# Patient Record
Sex: Female | Born: 1960 | Race: Black or African American | Hispanic: No | Marital: Single | Smoking: Current every day smoker
Health system: Southern US, Community
[De-identification: ages and names within clinical notes are randomized; demographics above are authoritative.]

## PROBLEM LIST (undated history)

## (undated) ENCOUNTER — Emergency Department (HOSPITAL_COMMUNITY): Disposition: A | Discharge status: Home / Self Care | Payer: Self-pay

## (undated) DIAGNOSIS — F319 Bipolar disorder, unspecified: Secondary | ICD-10-CM

## (undated) DIAGNOSIS — F32A Depression, unspecified: Secondary | ICD-10-CM

## (undated) DIAGNOSIS — F329 Major depressive disorder, single episode, unspecified: Secondary | ICD-10-CM

## (undated) DIAGNOSIS — F419 Anxiety disorder, unspecified: Secondary | ICD-10-CM

## (undated) DIAGNOSIS — I10 Essential (primary) hypertension: Secondary | ICD-10-CM

## (undated) HISTORY — DX: Depression, unspecified: F32.A

## (undated) HISTORY — PX: ABDOMINAL HYSTERECTOMY: SHX81

## (undated) HISTORY — DX: Major depressive disorder, single episode, unspecified: F32.9

## (undated) HISTORY — PX: OTHER SURGICAL HISTORY: SHX169

## (undated) HISTORY — DX: Anxiety disorder, unspecified: F41.9

## (undated) HISTORY — PX: ECTOPIC PREGNANCY SURGERY: SHX613

---

## 2000-04-17 ENCOUNTER — Ambulatory Visit (HOSPITAL_BASED_OUTPATIENT_CLINIC_OR_DEPARTMENT_OTHER): Admission: RE | Admit: 2000-04-17 | Discharge: 2000-04-17 | Payer: Self-pay | Admitting: Otolaryngology

## 2002-08-18 ENCOUNTER — Ambulatory Visit (HOSPITAL_BASED_OUTPATIENT_CLINIC_OR_DEPARTMENT_OTHER): Admission: RE | Admit: 2002-08-18 | Discharge: 2002-08-18 | Payer: Self-pay | Admitting: Obstetrics and Gynecology

## 2002-08-18 ENCOUNTER — Encounter (INDEPENDENT_AMBULATORY_CARE_PROVIDER_SITE_OTHER): Payer: Self-pay | Admitting: Specialist

## 2003-02-27 ENCOUNTER — Encounter: Payer: Self-pay | Admitting: Obstetrics and Gynecology

## 2003-03-02 ENCOUNTER — Inpatient Hospital Stay (HOSPITAL_COMMUNITY): Admission: RE | Admit: 2003-03-02 | Discharge: 2003-03-04 | Payer: Self-pay | Admitting: Obstetrics and Gynecology

## 2003-03-02 ENCOUNTER — Encounter (INDEPENDENT_AMBULATORY_CARE_PROVIDER_SITE_OTHER): Payer: Self-pay | Admitting: Specialist

## 2004-04-10 ENCOUNTER — Inpatient Hospital Stay (HOSPITAL_COMMUNITY): Admission: EM | Admit: 2004-04-10 | Discharge: 2004-04-13 | Payer: Self-pay | Admitting: *Deleted

## 2004-05-08 ENCOUNTER — Encounter: Admission: RE | Admit: 2004-05-08 | Discharge: 2004-05-08 | Payer: Self-pay | Admitting: Surgery

## 2004-05-09 ENCOUNTER — Inpatient Hospital Stay (HOSPITAL_COMMUNITY): Admission: AD | Admit: 2004-05-09 | Discharge: 2004-05-16 | Payer: Self-pay | Admitting: Surgery

## 2004-05-09 ENCOUNTER — Encounter (INDEPENDENT_AMBULATORY_CARE_PROVIDER_SITE_OTHER): Payer: Self-pay | Admitting: Specialist

## 2005-07-30 ENCOUNTER — Ambulatory Visit: Payer: Self-pay | Admitting: Psychiatry

## 2005-07-30 ENCOUNTER — Other Ambulatory Visit (HOSPITAL_COMMUNITY): Admission: RE | Admit: 2005-07-30 | Discharge: 2005-08-13 | Payer: Self-pay | Admitting: Psychiatry

## 2006-01-09 ENCOUNTER — Ambulatory Visit: Payer: Self-pay | Admitting: Internal Medicine

## 2006-04-14 ENCOUNTER — Ambulatory Visit: Payer: Self-pay | Admitting: Internal Medicine

## 2007-10-18 DIAGNOSIS — J45909 Unspecified asthma, uncomplicated: Secondary | ICD-10-CM

## 2007-10-18 DIAGNOSIS — Z8709 Personal history of other diseases of the respiratory system: Secondary | ICD-10-CM | POA: Insufficient documentation

## 2009-04-25 ENCOUNTER — Encounter: Payer: Self-pay | Admitting: Internal Medicine

## 2009-04-25 ENCOUNTER — Other Ambulatory Visit: Admission: RE | Admit: 2009-04-25 | Discharge: 2009-04-25 | Payer: Self-pay | Admitting: Internal Medicine

## 2009-04-25 ENCOUNTER — Ambulatory Visit: Payer: Self-pay | Admitting: Internal Medicine

## 2009-04-25 DIAGNOSIS — I10 Essential (primary) hypertension: Secondary | ICD-10-CM

## 2010-04-26 ENCOUNTER — Encounter: Payer: Self-pay | Admitting: Internal Medicine

## 2010-04-26 ENCOUNTER — Ambulatory Visit: Payer: Self-pay | Admitting: Family Medicine

## 2010-04-26 LAB — CONVERTED CEMR LAB
AST: 13 units/L (ref 0–37)
Albumin: 4.5 g/dL (ref 3.5–5.2)
Amphetamine Screen, Ur: NEGATIVE
Barbiturate Quant, Ur: NEGATIVE
Basophils Relative: 0 % (ref 0–1)
Benzodiazepines.: NEGATIVE
CO2: 27 meq/L (ref 19–32)
MCHC: 32.7 g/dL (ref 30.0–36.0)
MCV: 68.9 fL — ABNORMAL LOW (ref 78.0–100.0)
Marijuana Metabolite: NEGATIVE
Neutro Abs: 4.4 10*3/uL (ref 1.7–7.7)
Neutrophils Relative %: 58 % (ref 43–77)
Opiate Screen, Urine: NEGATIVE
Phencyclidine (PCP): NEGATIVE
Potassium: 3.7 meq/L (ref 3.5–5.3)
RBC: 6.17 M/uL — ABNORMAL HIGH (ref 3.87–5.11)
RDW: 17.9 % — ABNORMAL HIGH (ref 11.5–15.5)
Sodium: 141 meq/L (ref 135–145)
Total Bilirubin: 0.4 mg/dL (ref 0.3–1.2)
Total Protein: 6.8 g/dL (ref 6.0–8.3)

## 2010-04-30 ENCOUNTER — Encounter: Payer: Self-pay | Admitting: Internal Medicine

## 2010-04-30 LAB — CONVERTED CEMR LAB
Ferritin: 78 ng/mL (ref 10–291)
Saturation Ratios: 25 % (ref 20–55)

## 2010-05-27 ENCOUNTER — Ambulatory Visit: Payer: Self-pay | Admitting: Internal Medicine

## 2010-08-21 ENCOUNTER — Ambulatory Visit: Payer: Self-pay | Admitting: Internal Medicine

## 2010-09-02 ENCOUNTER — Ambulatory Visit (HOSPITAL_COMMUNITY): Admission: RE | Admit: 2010-09-02 | Discharge: 2010-09-02 | Payer: Self-pay | Admitting: Family Medicine

## 2011-01-12 ENCOUNTER — Encounter: Payer: Self-pay | Admitting: Surgery

## 2011-02-03 ENCOUNTER — Encounter: Payer: Self-pay | Admitting: Internal Medicine

## 2011-02-03 LAB — CONVERTED CEMR LAB
BUN: 7 mg/dL (ref 6–23)
Creatinine, Ser: 0.8 mg/dL (ref 0.40–1.20)
HCT: 43.3 % (ref 36.0–46.0)
MCHC: 32.6 g/dL (ref 30.0–36.0)
MCV: 70.3 fL — ABNORMAL LOW (ref 78.0–100.0)
RBC: 6.16 M/uL — ABNORMAL HIGH (ref 3.87–5.11)
RDW: 15.7 % — ABNORMAL HIGH (ref 11.5–15.5)
TSH: 1.754 microintl units/mL (ref 0.350–4.500)

## 2011-03-18 ENCOUNTER — Encounter: Payer: Self-pay | Admitting: Internal Medicine

## 2011-03-18 LAB — CONVERTED CEMR LAB
CO2: 26 meq/L (ref 19–32)
Calcium: 9.3 mg/dL (ref 8.4–10.5)
Creatinine, Ser: 0.79 mg/dL (ref 0.40–1.20)
Glucose, Bld: 99 mg/dL (ref 70–99)
Iron: 47 ug/dL (ref 42–145)
Sodium: 141 meq/L (ref 135–145)

## 2011-05-09 NOTE — H&P (Signed)
NAME:  Jackie Odonnell, Jackie Odonnell                        ACCOUNT NO.:  [id number]   MEDICAL RECORD NO.:  [id number]                   PATIENT TYPE:  INP   LOCATION:  0346                                 FACILITY:  [facility]   PHYSICIAN:  Sandria Bales. Ezzard Standing, M.D.               DATE OF BIRTH:  [birth year]   DATE OF ADMISSION:  04/09/2004  DATE OF DISCHARGE:                                HISTORY & PHYSICAL   HISTORY OF PRESENT ILLNESS:  Jackie Odonnell is a 50 year old black female who  is a patient of Dr. Darryll Capers who on Saturday, April 16, started  developing abdominal distention and vomiting. She had not really had a bowel  movement really since about Friday/Saturday which would be April 15 or 16.  Because of continued abdominal discomfort and distention, she saw Dr.  Lovell Sheehan on Monday, April 18. He recommended trying Gas-X and stool  softeners, but this really did not relieve her symptoms. Returned on  Tuesday, saw Dr. Amador Cunas at that time; however, after she left she  continued to feel worse, went to the urgent care where she saw Dr. Yong Channel  who obtained a KUB which was suggestive of a small-bowel obstruction. He  then referred her to me, and I met her in the [facility] emergency room.   She has had a prior upper endoscopy for a hiatal hernia by Dr. Yancey Flemings  about 2001. Has been treated for this medically. She has no history of liver  disease, gallbladder disease, pancreatic disease, colon disease. Her prior  abdominal operations included tubal pregnancy about 1987, and then she had a  hysterectomy by Dr. Kyra Manges in 2003 for benign causes. This was done  transabdominally. She said he did not take her ovaries out.   ALLERGIES:  She has no allergies.   CURRENT MEDICATIONS:  1. Amitriptyline 25 mg nightly.  2. Abilify 20 mg daily.   REVIEW OF SYSTEMS:  NEUROLOGICAL:  She has apparently been diagnosed with  paranoia, sees a Dr. Nolen Mu. She never had a stroke or a seizure.  PULMONARY:  Does not smoke cigarettes. No evidence of pneumonia,  tuberculosis. CARDIAC:  No evidence of heart disease, chest pain, or  hypertension. GASTROINTESTINAL:  See history of present illness.  GYNECOLOGIC:  She has total abdominal hysterectomy for a large uterine  fibroid in March in 2004 by Dr. Kyra Manges. UROLOGIC:  No kidney stones or  kidney infections.   SOCIAL HISTORY:  She works at Clinical biochemist at [employer]. She is  accompanied by her mother in the emergency room.   PHYSICAL EXAMINATION:  VITAL SIGNS:  Her temperature is 97, blood pressure  124/87, pulse is 132, respirations are 20.  GENERAL:  She is a well-developed, actually very pleasant black lady who is  alert, gives a pretty good history.  HEENT:  Unremarkable.  NECK:  Supple. No mass, no thyromegaly.  LUNGS:  Clear to auscultation.  There are symmetric breath sounds.  HEART:  Regular rate and rhythm. She is a little tachycardic. I do not hear  a murmur.  BREASTS:  She has no mass in either breast.  ABDOMEN:  She really has no tenderness or guarding. She may be mildly  distended. It is a really fairly unimpressive abdominal exam. I do not feel  any hernia.  EXTREMITIES:  She has good strength in all four extremities.  NEUROLOGICAL:  Grossly intact.   LABORATORY DATA:  Show a white blood cell count of 19,000, hemoglobin 15.6,  hematocrit 48, and platelet count 313,000. She has a sodium of 133,  potassium 3.0, chloride 88, CO2 of 34, glucose 114, BUN 28, creatinine 1.5.  Her albumin is 3.6, amylase 35, lipase 13.   Again, she brings films from [facility] where it certainly looks  like a bowel obstruction. She has not been treated at all, so it is really  unclear how complete this is.   IMPRESSION:  1. Bowel obstruction. Will plan NG tube and IV fluids. Rehydration and     reevaluation with serial x-rays. After she gets better, she may need some     further GI evaluation. She ___________  surgical intervention. Discussed     this with her and her mother.  2. Hypokalemic/hypochloremic metabolic alkalosis. ______________.  3. History of hiatal hernia but not on any chronic medicines for this.  4. 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                                               Sandria Bales. Ezzard Standing, M.D.    DHN/MEDQ  D:  04/10/2004  T:  04/10/2004  Job:  [id number]   cc:   Stacie Glaze, M.D. [facility]   Thereasa Parkin, M.D.   Katherine Roan, M.D.  [address]  [place]  [place] [address]  Fax: [phone]   Wilhemina Bonito. Marina Goodell, M.D. [facility]

## 2011-05-09 NOTE — Op Note (Signed)
NAME:  Jackie Odonnell, Jackie Odonnell                        ACCOUNT NO.:  [id number]   MEDICAL RECORD NO.:  [id number]                   PATIENT TYPE:  INP   LOCATION:  0002                                 FACILITY:  [facility]   PHYSICIAN:  Katherine Roan, M.D.               DATE OF BIRTH:  11-19-1961   DATE OF PROCEDURE:  DATE OF DISCHARGE:                                 OPERATIVE REPORT   PREOPERATIVE DIAGNOSES:  Large uterine fibroids, anemia, and abnormal  bleeding.   POSTOPERATIVE DIAGNOSES:  Large uterine fibroids, anemia, and abnormal  bleeding.   OPERATION:  Pelvic exam under anesthesia, exploratory laparotomy, total  abdominal hysterectomy.   BRIEF HISTORY:  Ms. Forbush is a 50 year old female status post two  hysteroscopies and conservatives attempts at controlling her heavy bleeding.  This has been unsuccessful even with Aygestin. She has a large submucous  fibroid. The patient was placed in the frog leg position under general  anesthesia and examined and confirmed a 12 week size uterine fibroid that  was mobile. The patient was then prepped and draped, Foley catheter was  inserted. A transverse incision was made in the abdomen and extended to the  peritoneal cavity which was entered vertically. Hemostasis with the Bovie.  Exploration of the upper abdomen revealed a smooth liver, two normal  kidneys, no adenopathy was noted. I felt no stones in the gallbladder. The  abdominal viscera were then packed away from the pelvic viscera and the  uterus was elevated. The round ligaments were then clamped and ligated with  #0 Chromic suture. The uteroovarian anastomosis and tube were clamped on  each side and ligated. Both ovaries were quite small. The uterine arteries  were skeletonized, a bladder flap created, uterine arteries were clamped on  each side and ligated. The cardinal and uterosacral ligaments were handled  in succession and sutured with #0 Chromic. The angles of the vagina  were  entered and the specimens removed from the operative field. The vagina was  then closed vertically with horizontal mattress suture of #0 Chromic  following which I used a figure-of-eight #0 Vicryl. The uterosacral  ligaments were then plicated in the midline with interrupted sutures of #0  Vicryl. Hemostasis appeared to be quite secure, reperitonealization with 3-0  Vicryl. The pelvis was the washed with copious amounts of saline, the  pareitoperitoneum was closed with 2-0 PDS, the fascia was closed with  continuous 2-0 PDS and one interrupted 2-0 PDS. Hemostasis was secure. The  subcutaneum was irrigated and skin was closed with 3-0 plain. The incision  was infiltrated with 0.5% Marcaine. Ms. Haselton tolerated this procedure  well and was sent to the recovery room in good condition.  Katherine Roan, M.D.    SDM/MEDQ  D:  03/02/2003  T:  03/02/2003  Job:  [id number]

## 2011-05-09 NOTE — Op Note (Signed)
NAME:  Jackie Odonnell, Jackie Odonnell                        ACCOUNT NO.:  [id number]   MEDICAL RECORD NO.:  [id number]                   PATIENT TYPE:  INP   LOCATION:  5703                                 FACILITY:  [facility]   PHYSICIAN:  Sandria Bales. Ezzard Standing, M.D.               DATE OF BIRTH:  July 12, 1961   DATE OF PROCEDURE:  05/09/2004  DATE OF DISCHARGE:                                 OPERATIVE REPORT   PREOPERATIVE DIAGNOSIS:  High-grade small bowel obstruction.   POSTOPERATIVE DIAGNOSIS:  Small bowel obstruction secondary to adhesive band  to right pelvis, with stricture of distal small bowel.   PROCEDURE:  1. Enterolysis of adhesions.  2. Laparoscopic converted open enterolysis of adhesions, with resection of 8     cm segment of small bowel.   SURGEON:  Sandria Bales. Ezzard Standing, M.D.   FIRST ASSISTANT:  None.   ANESTHESIA:  General endotracheal.   ESTIMATED BLOOD LOSS:  Minimal.   INDICATIONS FOR PROCEDURE:  Jackie Odonnell is a 50 year old black female who  was hospitalized at [facility] from April 19-23, 2005 for a small  bowel obstruction, which resolved with medical management.  However, since  going home she has continued to have periods of bloating, nausea and  vomiting.  She underwent a small bowel series yesterday done at [facility], which  showed a high-grade bowel obstruction.  She had vomited two days ago.  She  was only able to take liquids until she gets sick, therefore she is brought  to the hospital today for laparoscope abdominal exploration for evaluation  of recurrent high-grade small bowel obstruction.   The indications and potential complications were discussed with the patient.  The potential complications include, but not limited to:  bleeding,  infection, bowel resection and the possibility of open surgery.   DESCRIPTION OF PROCEDURE:  The patient is taken to the operating room, where  she was given 2 g Cefotetan at the initiation of the procedure.  She had a  Foley  catheter in place, NG tube in place, PAS stockings in place.  I first  went to an infraumbilical incision and put a 10 mm 0-degree laparoscope  through a 12 mm Hasson trocar.  Secured the Hasson trocar with the 0 Vicryl  suture.  I placed two 5 mm trocars, one in the right upper quadrant and one  in the left lower quadrant.  I was able to actually get a pretty good  exploration of the abdomen.  The right and left lobes of the liver were  unremarkable.  She has known gallstones, and though her gallbladder was  maybe somewhat pale it was not acutely inflamed.  Her stomach was  unremarkable.  What was remarkable is that she did have essentially  dilatation of almost her entire small bowel.  I was able to mobilize the  small bowel out of the pelvis, and traced the small bowel to a narrowing  in  the distal small bowel.  There was, however, a band which I could not  totally clear whether I had released the entire band or not, or that whether  she had a stricture.   I went on and made an open laparotomy incision through a midline incision.  I was able to mobilize upper small bowel.  Again, she had a single band.  She had a single adhesion that was still attached to the right pelvis, which  I went on and cut.  I then tried to free up the small bowel, but really  could not free up which was fairly tightly strictured (down to maybe 5-6 mm  in size would be my guess).  I did not think I could do an adequate  strictureplasty because of some other scarring around it, so I decided to go  on and just resect this limited segment of bowel.  It did not look malignant  and it was really a solitary adhesive segment.   I resected about 8 cm of small bowel, ligated the mesentery with interrupted  2-0 silk pop-off sutures.  I stapled both ends with Endo GIA stapler; what I  ended up having was about 8-10 cm of the terminal ileum with a small bowel;  so I actually did a side-to-side handsewn anastomosis, and  created about a 2  cm enterotomy; sewing this with interrupted 2-0 silk sutures.  This did  allow a fingerbreadth and a half through this anastomosis when it was  complete.  I buttressed it with 2-0 silk sutures, returned the cecum and  terminal ileum back to the abdomen.   I then irrigated out with 2 L of saline.  There was some minimal  contamination because she did have barium and had some content in the small  bowel; but, this was fairly minimally.  I then irrigated out the wound.  I  closed the abdomen with two running #1 PDS sutures, closed the skin with  skin staples, stapled both the 5 mm ports.  I did leave the NG tube in.  The  Foley catheter we will leave overnight (we can probably take this out  tomorrow).   The final pathology is pending at the time of this dictation.                                               Sandria Bales. Ezzard Standing, M.D.    DHN/MEDQ  D:  05/09/2004  T:  05/11/2004  Job:  [id number]   cc:   Stacie Glaze, M.D. [facility]   S. Kyra Manges, M.D.  [phone]  [place]  [place] [address]  Fax: [phone]   Wilhemina Bonito. Marina Goodell, M.D. [facility]

## 2011-05-09 NOTE — Discharge Summary (Signed)
   NAMESTEPHANNY, Jackie Odonnell                        ACCOUNT NO.:  [id number]   MEDICAL RECORD NO.:  [id number]                   PATIENT TYPE:  INP   LOCATION:  0464                                 FACILITY:  [facility]   PHYSICIAN:  Katherine Roan, M.D.               DATE OF BIRTH:  Sep 01, 1961   DATE OF ADMISSION:  03/02/2003  DATE OF DISCHARGE:  03/04/2003                                 DISCHARGE SUMMARY   ADMISSION DIAGNOSES:  1. Large uterine fibroids.  2. Menorrhagia.  3. Anemia.   DISCHARGE DIAGNOSES:  1. Large uterine fibroids.  2. Menorrhagia.  3. Anemia.   OPERATION PERFORMED:  Total abdominal hysterectomy.   HISTORY OF PRESENT ILLNESS:  The patient is a 50 year old female status post  multiple hysteroscopies to control a large mucus myoma with continued  bleeding.  She was admitted for hysterectomy.   LABORATORY DATA:  Chest x-ray was negative.  Cardiogram was normal.  Pathology report confirmed a 340 g uterus.  Comprehensive metabolic profile  was normal.  Admission hemoglobin was 10, with a MCV of 55.   HOSPITAL COURSE:  The patient was admitted to the hospital, underwent an  uneventful hysterectomy.  Postoperatively, she was afebrile and without  complaints.  Incision healing nicely.  She was discharged home on 03/04/03,  to home and office care.  Asked to call for fever, bleeding, or any other  difficulties she may encounter.   FOLLOWUP:  She is to return to the office in two weeks.   CONDITION ON DISCHARGE:  Improved.                                               Katherine Roan, M.D.    SDM/MEDQ  D:  03/04/2003  T:  03/04/2003  Job:  [id number]

## 2011-05-09 NOTE — H&P (Signed)
NAME:  Jackie Odonnell, Jackie Odonnell                        ACCOUNT NO.:  [id number]   MEDICAL RECORD NO.:  [id number]                   PATIENT TYPE:  INP   LOCATION:  NA                                   FACILITY:  [facility]   PHYSICIAN:  Katherine Roan, M.D.               DATE OF BIRTH:  1961/04/07   DATE OF ADMISSION:  DATE OF DISCHARGE:                                HISTORY & PHYSICAL   CHIEF COMPLAINT:  Continued abnormal uterine bleeding.   HISTORY OF PRESENT ILLNESS:  Jackie Odonnell is a 49 year old gravida 1 female  who presents with long-term abnormal vaginal bleeding which has not  responded to hysteroscopy with resection of large uterine fibroids. We tried  to do this on at least one occasion. The second time we did it was we  removed about a 7 cm aggregate of the myoma and she continues to bleed and  hysterectomy is recommended.   MEDICATIONS:  Aygestin 5 mg b.i.d. and she is on amitriptyline 25 mg and  Haldol 2 mg at bedtime. She also is on Prilosec.   REVIEW OF SYMPTOMS:  HEENT:  She has frequent sore throat but no decrease in  visual or auditory acuity. No dizziness. HEART:  No history of hypertension,  no rheumatic fever. LUNGS:  No history of cough. GU:  No stress  incontinence. She denies history of nephritis. GI:  Negative. No bowel habit  change, no melena. MUSCLE/BONES/JOINTS:  Negative. No fractures or  arthritis.   SOCIAL HISTORY:  She smokes a pack of cigarettes daily. Mother and father  are both living. She has one brother and one sister who is in good health.  She has a paternal grandmother who is diabetic.   Her comorbidities are involved with the treatment using with Haldol for a  personality disorder. She is well compensated with this medication.   PHYSICAL EXAMINATION:  GENERAL:  Reveals a pleasant, well-developed,  nourished, 50 year old black female who appears to be her stated age, is  oriented to time, place and recent events. Has just watched informed  consent  movie in regard to hysterectomy.  VITAL SIGNS:  Blood pressure 140/80, weight 179.  HEENT:  Unremarkable. Pupils are round, reactive to light and accomodation.  Oropharynx is not injected. Carotid pulses are equal without bruit. Thyroid  is slightly enlarged and dense. No focal lesions are noted. No adenopathy  appreciated.  BREASTS:  No masses or tenderness.  LUNGS:  Clear to P&A.  HEART:  Normal sinus rhythm, no murmurs.  ABDOMEN:  Soft. Liver, spleen and kidneys are not palpated. There is a low  transverse incision the abdomen. No masses or tenderness and bowel sounds  are normal.  EXTREMITIES:  Show good range of motion, equal pulses and reflexes.  PELVIC:  Reveals and anterior and normal looking vagina. Cervix is normal,  uterus is anterior, 2-3 times normal size and irregular. Adnexa negative.  Rectovaginal  confirms. Pap was taken.   IMPRESSION:  1. Essentially normal examination with uterine fibroids.  2.     History of personality disorder.  3. History of ectopic pregnancy.   PLAN:  Total abdominal hysterectomy. Risks and benefits have been discussed  with the patient.                                               Katherine Roan, M.D.    SDM/MEDQ  D:  02/28/2003  T:  02/28/2003  Job:  [id number]

## 2011-05-09 NOTE — Op Note (Signed)
   Jackie Odonnell, Jackie Odonnell                         ACCOUNT NO.:  [id number]   MEDICAL RECORD NO.:  [id number]                   PATIENT TYPE:  AMB   LOCATION:  NESC                                 FACILITY[facility]   PHYSICIAN:  Katherine Roan, M.D.               DATE OF BIRTH:  1961-07-25   DATE OF PROCEDURE:  08/18/2002  DATE OF DISCHARGE:                                 OPERATIVE REPORT   PREOPERATIVE DIAGNOSES:  1. Heavy periods.  2. Uterine fibroids.   POSTOPERATIVE DIAGNOSES:  1. Heavy periods.  2. Uterine fibroids.   OPERATION:  Hysteroscopy with resection of uterine submucosal fibroids.   DESCRIPTION OF PROCEDURE:  The patient was placed in lithotomy position and  prepped and draped in the usual fashion.  Cervix carefully dilated and  endometrial cavity was examined.  There were several large uterine fibroids  which were resected with the resectoscope.  Prior to resection, I injected  the uterus with 10 cc on each side of a Pitressin solution.  All of the  resected material was sent to the lab for study.  Prior to hysteroscopy,  exam under anesthesia revealed a 10-12 week size uterus.  No adnexal masses  were noted.  The patient tolerated the procedure well and was sent to the  recovery room in good condition.                                               Katherine Roan, M.D.    SDM/MEDQ  D:  08/18/2002  T:  08/20/2002  Job:  [id number]

## 2011-05-09 NOTE — Discharge Summary (Signed)
NAME:  Jackie Odonnell, Jackie Odonnell                        ACCOUNT NO.:  [id number]   MEDICAL RECORD NO.:  [id number]                   PATIENT TYPE:  INP   LOCATION:  5703                                 FACILITY:  [facility]   PHYSICIAN:  Sandria Bales. Ezzard Standing, M.D.               DATE OF BIRTH:  09-Feb-1961   DATE OF ADMISSION:  05/09/2004  DATE OF DISCHARGE:  05/16/2004                                 DISCHARGE SUMMARY   DISCHARGE DIAGNOSES:  1. Distal small-bowel obstruction with segmental fibrosis.  2. Gallstones, asymptomatic.  3. Hiatal hernia with chronic reflux.  4. Paranoid schizophrenia, followed by Dr. Andee Poles.  5. History of total abdominal hysterectomy in March of 2004 by Dr. Katherine Roan.   OPERATION PERFORMED:  Enterolysis of adhesions and resection of distal small  bowel by Dr. Sandria Bales. Newman on the 19th of May 2005.   HISTORY OF ILLNESS:  Jackie Odonnell is a 50 year old black female, who is a  patient of Dr. Stacie Glaze, who developed abdominal pain and distention  in April of 2005 and was admitted to [facility] from the 19th to  the 23rd of April with a partial bowel obstruction.  This bowel obstruction,  however, has resolved with medical therapy.  She was discharged home on the  23rd of April, doing well.  However, about 1 week prior to admission, she  started developing increasing abdominal symptoms of discomfort, could only  keep liquids down.  She had a small bowel series done on the 18th of May  2005 which showed a high-grade distal bowel obstruction.  She is now  admitted on the 19th of May 2005 for abdominal exploration and  identification of her source of this bowel obstruction.   PAST MEDICAL HISTORY:  Her past medical history is significant in that she  had no allergies.   MEDICATIONS:  Her current medications include:  1. Amitriptyline 25 mg nightly.  2. Abilify 20 mg daily.   PAST SURGICAL HISTORY:  Her only prior abdominal surgery had been  a total  abdominal hysterectomy by Dr. Kyra Manges in March of 2004 for benign  uterine fibroids.  She has no history of malignancy.  She works in Dietitian for [employer].   HOSPITAL COURSE:  On the day of admission, she was taken to the operating  room, where she underwent a laparotomy.  I actually tried to do this first  laparoscopically but could not really expose the strictured area well  laparoscopically, so I did convert her to open surgery.  She had a band to  the pelvis which had tied down a piece of small bowel at the distal ileum.  I lysed the band, exposed the small bowel, but she had a high-grade  stricture at the area where she had the obstruction.  I therefore resected  about 8 cm of small bowel immediately  before the terminal ileum and did a  primary end-to-end small bowel anastomosis immediately before the terminal  ileum.   The final pathology showed ulceration and inflammation, though there was a  question of Crohn's on the pathology; she had no clinical Crohn's evidence  at the time of laparotomy.  Postoperatively, she did well.  I kept an NG  tube in her for 3 days, then removed the NG tube when she started passing  flatus.  By the 5th postoperative day, she was able to start clear liquids  and then advance to a regular diet.  She is now 7 days postop.  She is  afebrile.  Her abdominal wound is well-healed.  We will remove her staples  today and steri-strip her wound.   DISCHARGE MEDICATIONS:  She will be given Vicodin for pain and resume her  home medicines.   FOLLOWUP:  She will return to see me in 2-3 weeks for followup.  She knows  to call for any interval problems.                                                Sandria Bales. Ezzard Standing, M.D.    DHN/MEDQ  D:  05/16/2004  T:  05/18/2004  Job:  [id number]   cc:   Stacie Glaze, M.D. [facility]   S. Kyra Manges, M.D.  [phone]  [place]  [place] [address]  Fax: [phone]   Wilhemina Bonito. Marina Goodell, M.D.  [facility]

## 2011-05-09 NOTE — Op Note (Signed)
[place]. [facility]  Patient:    Jackie Odonnell, Jackie Odonnell                       MRN: [id number] Proc. Date: 04/17/00 Adm. Date:  [id number] Attending:  Carlean Purl CC:         Stacie Glaze, M.D. [facility]                           Operative Report  PREOPERATIVE DIAGNOSIS:  A hoarseness with persistent vocal cord leukoplakia.  POSTOPERATIVE DIAGNOSIS:  A hoarseness with persistent vocal cord leukoplakia.  OPERATION:  Microlaryngoscopy with biopsy of left vocal cord leukoplakia (vocal cord stripping).  SURGEON:  Kristine Garbe. Ezzard Standing, M.D.  ANESTHESIA:  General endotracheal.  COMPLICATIONS:  None.  BRIEF CLINICAL NOTE:  Jackie Odonnell is a 50 year old female, who has had hoarseness for close to a year.  It has varied in severity.  She also has a significant smoking history of a pack a day.  On examination in the office, she has had a persistent leukoplakia, much worse on the left vocal cord compared to the right.  She had normal vocal cord mobility otherwise.  Because of the persistent leukoplakia and persistent hoarseness and history of smoking, she is taken to the operating room at this time for direct laryngoscopy and biopsy of the left vocal cord leukoplakia.  DESCRIPTION OF PROCEDURE:  After adequate endotracheal anesthesia, direct laryngoscopy was performed.  Base of tongue, epiglottis, both piriform sinuses and AE folds all appeared normal.  On evaluation of the endolarynx, Jackie Odonnell had bilateral leukoplakia with leukoplakic changes on the right true vocal cord and a fairly pronounced leukoplakic changes on the left vocal cord with a small ulceration.  Photos were obtained.  Then, the left vocal cord was stripped using small microforceps and scissors.  This completed the procedure. Jackie Odonnell was subsequently awoke from anesthesia and transferred to recovery room postoperatively doing well.  DISPOSITION:  Jackie Odonnell was discharged home.  Will place her  on antacid therapy for three weeks, Prilosec 20 mg per day for three weeks in addition to voice rest and encourage her to decrease her smoking.  Will have her follow up in my office in two weeks for recheck and to review pathology of biopsy. DD:  04/17/00 TD:  04/17/00 Job: [id number]

## 2011-05-09 NOTE — Discharge Summary (Signed)
NAME:  Jackie Odonnell, Jackie Odonnell                        ACCOUNT NO.:  [id number]   MEDICAL RECORD NO.:  [id number]                   PATIENT TYPE:  INP   LOCATION:  0346                                 FACILITY:  [facility]   PHYSICIAN:  Sandria Bales. Ezzard Standing, M.D.               DATE OF BIRTH:  September 02, 1961   DATE OF ADMISSION:  04/09/2004  DATE OF DISCHARGE:  04/13/2004                                 DISCHARGE SUMMARY   DISCHARGE DIAGNOSES:  1. Resolved partial bowel obstruction; etiology unknown.  2. Hypokalemic, hypochloremic metabolic alkalosis, therefore is reversed.  3. History of hiatal hernia with chronic reflux esophagitis.  4. Paranoid schizophrenia, followed by Dr. Nolen Mu, but appeared stable     during her hospitalization.  5. History of total abdominal hysterectomy in March 2004 by Dr. Kyra Manges.   OPERATION:  None.   HISTORY OF PRESENT ILLNESS:  Jackie Odonnell is a 50 year old black female who  is a patient of Dr. Darryll Capers, who on Saturday, April 06, 2004, started  developing increasing abdominal distention. She claims to have not had a  bowel movement since Friday and Saturday of April 16th and 17th. Because of  increased discomfort, she saw Dr. Lovell Sheehan on Monday, April 18th, was tried  on Gas-X and stool softeners, but was not having relief of her symptoms. On  Tuesday, April 19th, she saw Dr. Eleonore Chiquito, and then later saw in  the Urgent Care Clinic, Dr. Merla Riches, who obtained a KUB which was  suggestive of small bowel obstruction and referred to me.   I met her in the [facility] emergency room. She had a prior upper endoscopy  for hiatal hernia by Dr. Yancey Flemings and this is being treated medically. She  had no other significant gastrointestinal history. She had had a tubal  pregnancy in 1987 and she had a hysterectomy by Dr. Elana Alm for benign  reasons in March 2004.  She also carries a history of paranoid  schizophrenia, followed by Dr. Nolen Mu, but apparently  this has been fairly  stable.   On admission she was found to have a KUB that appeared to be consistent with  a partial small bowel obstruction. Her white blood count was 19,000,  hemoglobin 15, and hematocrit 48. Her potassium was 3.3, chloride 88. She  was admitted, placed on IV fluids, and rehydrated. She also had an NG tube  placed on the first postoperative day. On the a.m. of admission, her white  blood cell count was 18,900, sodium 131, potassium 4.3, chloride 91.  She  started passing gas and having a bowel movement on her first hospital day.  The KUB showed a significantly less small bowel area, now she had colon air.  Her NG tube was removed. She was started on clear liquids and by the third  hospital day she was afebrile, her abdomen was soft, she was passing flatus,  having  bowel movements, and she was ready for discharge.   Her discharge condition was improved. She is to return on her medications  she had been on before she came in which include amitriptyline 25 mg at  night, Abilify 20 mg every night. There is no restriction to her diet. She  can have any kind of activity she wanted as she had no wounds to take care  of. She is to call to see me within one or two weeks back in the office to  make sure she is doing well. She is to get back in touch with Dr. Lovell Sheehan'  office.                                               Sandria Bales. Ezzard Standing, M.D.    DHN/MEDQ  D:  05/08/2004  T:  05/09/2004  Job:  [id number]   cc:   Stacie Glaze, M.D. [facility]   Robert P. Merla Riches, M.D.  [address]  [place]  [place] [address]  Fax: [phone]   S. Kyra Manges, M.D.  [phone] [address]  [place]  [place] [address]  Fax: [phone]   Wilhemina Bonito. Marina Goodell, M.D. [facility]

## 2012-08-05 ENCOUNTER — Inpatient Hospital Stay (HOSPITAL_COMMUNITY)
Admission: EM | Admit: 2012-08-05 | Discharge: 2012-08-07 | DRG: 641 | Disposition: A | Discharge status: Other Acute Inpatient Hospital | Payer: Medicaid Other | Attending: Internal Medicine | Admitting: Internal Medicine

## 2012-08-05 ENCOUNTER — Encounter (HOSPITAL_COMMUNITY): Payer: Self-pay | Admitting: Behavioral Health

## 2012-08-05 ENCOUNTER — Encounter (HOSPITAL_COMMUNITY): Payer: Self-pay | Admitting: Family Medicine

## 2012-08-05 ENCOUNTER — Ambulatory Visit (HOSPITAL_COMMUNITY)
Admission: RE | Admit: 2012-08-05 | Discharge: 2012-08-05 | Disposition: A | Discharge status: Home / Self Care | Payer: Medicaid Other | Attending: Psychiatry | Admitting: Psychiatry

## 2012-08-05 DIAGNOSIS — F200 Paranoid schizophrenia: Secondary | ICD-10-CM | POA: Insufficient documentation

## 2012-08-05 DIAGNOSIS — I10 Essential (primary) hypertension: Secondary | ICD-10-CM | POA: Diagnosis present

## 2012-08-05 DIAGNOSIS — F17200 Nicotine dependence, unspecified, uncomplicated: Secondary | ICD-10-CM | POA: Diagnosis present

## 2012-08-05 DIAGNOSIS — R45851 Suicidal ideations: Secondary | ICD-10-CM

## 2012-08-05 DIAGNOSIS — R443 Hallucinations, unspecified: Secondary | ICD-10-CM | POA: Diagnosis present

## 2012-08-05 DIAGNOSIS — E876 Hypokalemia: Secondary | ICD-10-CM | POA: Diagnosis present

## 2012-08-05 DIAGNOSIS — Z720 Tobacco use: Secondary | ICD-10-CM | POA: Diagnosis present

## 2012-08-05 DIAGNOSIS — J45909 Unspecified asthma, uncomplicated: Secondary | ICD-10-CM | POA: Diagnosis present

## 2012-08-05 DIAGNOSIS — J410 Simple chronic bronchitis: Secondary | ICD-10-CM | POA: Diagnosis present

## 2012-08-05 DIAGNOSIS — E871 Hypo-osmolality and hyponatremia: Principal | ICD-10-CM | POA: Diagnosis present

## 2012-08-05 HISTORY — DX: Essential (primary) hypertension: I10

## 2012-08-05 HISTORY — DX: Bipolar disorder, unspecified: F31.9

## 2012-08-05 LAB — CBC
MCH: 23.8 pg — ABNORMAL LOW (ref 26.0–34.0)
RBC: 5.68 MIL/uL — ABNORMAL HIGH (ref 3.87–5.11)
RDW: 13.8 % (ref 11.5–15.5)

## 2012-08-05 LAB — BASIC METABOLIC PANEL
Calcium: 9.3 mg/dL (ref 8.4–10.5)
Chloride: 76 mEq/L — ABNORMAL LOW (ref 96–112)
Creatinine, Ser: 0.62 mg/dL (ref 0.50–1.10)
GFR calc Af Amer: >90 mL/min (ref >90)
Glucose, Bld: 114 mg/dL — ABNORMAL HIGH (ref 70–99)
Potassium: 2.1 mEq/L — CL (ref 3.5–5.1)
Sodium: 124 mEq/L — ABNORMAL LOW (ref 135–145)

## 2012-08-05 LAB — URINALYSIS, ROUTINE W REFLEX MICROSCOPIC
Nitrite: NEGATIVE
Specific Gravity, Urine: 1.004 — ABNORMAL LOW (ref 1.005–1.030)
Urobilinogen, UA: 0.2 mg/dL (ref 0.0–1.0)

## 2012-08-05 MED ORDER — ONDANSETRON HCL 4 MG/2ML IJ SOLN: 4.0000 mg | Freq: Four times a day (QID) | INTRAMUSCULAR | Status: DC | PRN

## 2012-08-05 MED ORDER — ONDANSETRON HCL 4 MG PO TABS: 4.0000 mg | ORAL_TABLET | Freq: Four times a day (QID) | ORAL | Status: DC | PRN

## 2012-08-05 MED ORDER — POTASSIUM CHLORIDE 10 MEQ/100ML IV SOLN
10.0000 meq | Freq: Once | INTRAVENOUS | Status: AC
  Administered 2012-08-05: 10 meq via INTRAVENOUS
  Filled 2012-08-05: qty 100

## 2012-08-05 MED ORDER — POTASSIUM CHLORIDE CRYS ER 20 MEQ PO TBCR
40.0000 meq | EXTENDED_RELEASE_TABLET | Freq: Two times a day (BID) | ORAL | Status: DC
  Administered 2012-08-06: 40 meq via ORAL
  Filled 2012-08-05 (×3): qty 2

## 2012-08-05 MED ORDER — HYDROCODONE-ACETAMINOPHEN 5-325 MG PO TABS: 1.0000 | ORAL_TABLET | ORAL | Status: DC | PRN

## 2012-08-05 MED ORDER — SODIUM CHLORIDE 0.9 % IV SOLN
INTRAVENOUS | Status: AC
  Administered 2012-08-06: 02:00:00 via INTRAVENOUS

## 2012-08-05 MED ORDER — LAMOTRIGINE 150 MG PO TABS
150.0000 mg | ORAL_TABLET | Freq: Once | ORAL | Status: AC
  Administered 2012-08-05: 150 mg via ORAL
  Filled 2012-08-05: qty 1

## 2012-08-05 MED ORDER — HALOPERIDOL 5 MG PO TABS
10.0000 mg | ORAL_TABLET | Freq: Once | ORAL | Status: AC
  Administered 2012-08-05: 10 mg via ORAL

## 2012-08-05 MED ORDER — ENOXAPARIN SODIUM 40 MG/0.4ML ~~LOC~~ SOLN
40.0000 mg | SUBCUTANEOUS | Status: DC
  Administered 2012-08-06 – 2012-08-07 (×2): 40 mg via SUBCUTANEOUS
  Filled 2012-08-05 (×2): qty 0.4

## 2012-08-05 MED ORDER — HALOPERIDOL 5 MG PO TABS
10.0000 mg | ORAL_TABLET | Freq: Every day | ORAL | Status: DC
  Administered 2012-08-06 – 2012-08-07 (×2): 10 mg via ORAL
  Filled 2012-08-05 (×3): qty 2

## 2012-08-05 MED ORDER — NICOTINE 21 MG/24HR TD PT24
21.0000 mg | MEDICATED_PATCH | TRANSDERMAL | Status: DC
  Administered 2012-08-06 – 2012-08-07 (×3): 21 mg via TRANSDERMAL
  Filled 2012-08-05 (×3): qty 1

## 2012-08-05 MED ORDER — LAMOTRIGINE 150 MG PO TABS
150.0000 mg | ORAL_TABLET | Freq: Every day | ORAL | Status: DC
  Administered 2012-08-06 – 2012-08-07 (×2): 150 mg via ORAL
  Filled 2012-08-05 (×2): qty 1

## 2012-08-05 MED ORDER — SODIUM CHLORIDE 0.9 % IV BOLUS (SEPSIS): 1000.0000 mL | Freq: Once | INTRAVENOUS | Status: DC

## 2012-08-05 MED ORDER — SODIUM CHLORIDE 0.9 % IJ SOLN
3.0000 mL | Freq: Two times a day (BID) | INTRAMUSCULAR | Status: DC
  Administered 2012-08-06 – 2012-08-07 (×3): 3 mL via INTRAVENOUS

## 2012-08-05 NOTE — ED Notes (Signed)
Patient states that she is hearing voices that are telling her to kill herself. States she is afraid that she is going to hurt herself. Has had thoughts of jumping off her balcony. Patient is from [facility] for medical clearance and has a bed there after being medically cleared. Patient states that she ran out of her Lamictal and Haldol 2 days ago.

## 2012-08-05 NOTE — ED Notes (Signed)
Pt belongings behind nurses station in triage.

## 2012-08-05 NOTE — BH Assessment (Signed)
Assessment Note   Jackie Odonnell is an 51 y.o. female accompanied by her mother and her sister. She presents due to hearing voices that are telling her to "kill yourself", while they do not tell her how to harm herself. She states that "the voices won't stop".Pt endorses SI, with intent and plan and cannot sign for safety. Pt stated that she would jump of the balcony or take pills to kill herself. Pt has knives and scissors in her apartment that could be used as well. Pt has had two previous attempt and does not remember the trigger or the timeline. She denies HI, or delusions, yet she exhibits paranoia and stated, "people are putting poison in my food" she is fearful of other family members outside of her immediate family. Pt has not taken her prescribed Haldol or Lamictal within the past 3 days. Pt has a hx of high blood pressure and has not take her medication for her pressure in as many days.   Pt was denied disability one week ago after trying for more than three years. Pt had submitted supporting medical documents that indicate she is unable to work. Pt is very worried that she is unable to pay for any of her medication, water, lights or anything. Pt is in danger of being evicted from her apartment due to lack of money. Pt's mother, sister and brother are her sole financial providers, she has not been able to work in years. Pt is sleeping less than 8 hours nightly and has noticed a 10-20 lb weight fluctuation within the past 30 days. Pt's remote and recent memory are not intact and she is a poor historian. Pt reports that she needs to be in the hospital.      Axis I: Chronic Paranoid Schizophrenia Axis II: Deferred Axis III:  Past Medical History  Diagnosis Date  . Hypertension    Axis IV: economic problems, occupational problems, problems related to social environment and problems with access to health care services Axis V: 21-30 behavior considerably influenced by delusions or  hallucinations OR serious impairment in judgment, communication OR inability to function in almost all areas  Past Medical History:  Past Medical History  Diagnosis Date  . Hypertension     No past surgical history on file.  Family History: No family history on file.  Social History:  reports that she has been smoking Cigarettes.  She has a 30 pack-year smoking history. She does not have any smokeless tobacco history on file. She reports that she does not drink alcohol or use illicit drugs.  Additional Social History:  Alcohol / Drug Use Pain Medications: None noted History of alcohol / drug use?: No history of alcohol / drug abuse  CIWA:   COWS:    Allergies: Allergies not on file  Home Medications:  (Not in a hospital admission)  OB/GYN Status:  No LMP recorded.  General Assessment Data Location of Assessment: [facility] Services Living Arrangements: Alone Can pt return to current living arrangement?: Yes Admission Status: Voluntary Is patient capable of signing voluntary admission?: Yes Transfer from: Home Referral Source: Self/Family/Friend  Education Status Is patient currently in school?: No  Risk to self Suicidal Ideation: Yes-Currently Present Suicidal Intent: Yes-Currently Present Is patient at risk for suicide?: Yes Suicidal Plan?: Yes-Currently Present Specify Current Suicidal Plan: Pt stated that she would jump off balcany or take pills Access to Means: Yes Specify Access to Suicidal Means: Pt has medication, knives, scissors in her home What has  been your use of drugs/alcohol within the last 12 months?: None Previous Attempts/Gestures: Yes How many times?: 1  Other Self Harm Risks: Chain smoking cigaretts Triggers for Past Attempts: Unknown;Other (Comment) (Pt does not remember the past trigger) Intentional Self Injurious Behavior: None Family Suicide History: No Recent stressful life event(s): Financial Problems;Other (Comment) (Recently denied  disability for the third time) Persecutory voices/beliefs?: Yes Depression: Yes Depression Symptoms: Insomnia;Isolating;Fatigue;Loss of interest in usual pleasures Substance abuse history and/or treatment for substance abuse?: No Suicide prevention information given to non-admitted patients: Not applicable  Risk to Others Homicidal Ideation: No Thoughts of Harm to Others: No Current Homicidal Intent: No Current Homicidal Plan: No Access to Homicidal Means: No History of harm to others?: No Assessment of Violence: None Noted Does patient have access to weapons?: Yes (Comment) (Pt has knives, scissors in her home) Criminal Charges Pending?: No Does patient have a court date: No  Psychosis Hallucinations: Auditory Delusions: None noted  Mental Status Report Appear/Hygiene: Poor hygiene;Disheveled Eye Contact: Poor Motor Activity: Unremarkable Speech: Slow Level of Consciousness: Alert Mood: Worthless, low self-esteem;Sad;Suspicious Affect: Appropriate to circumstance (Flat and expressionless) Anxiety Level: Severe Thought Processes: Relevant Judgement: Impaired Orientation: Person;Place;Time;Situation Obsessive Compulsive Thoughts/Behaviors: Severe (Paranoid that people are going to poison her)  Cognitive Functioning Concentration: Decreased Memory: Recent Impaired;Remote Impaired IQ: Average Insight: Poor Impulse Control: Poor Appetite: Fair Weight Loss:  (Down 10lbs last 30 days) Sleep: Decreased Total Hours of Sleep:  (Less than 8 ) Vegetative Symptoms: None  ADLScreening [facility] Assessment Services) Patient's cognitive ability adequate to safely complete daily activities?: Yes Patient able to express need for assistance with ADLs?: Yes Independently performs ADLs?: Yes (appropriate for developmental age)  Abuse/Neglect [facility]) Physical Abuse: Denies Verbal Abuse: Denies Sexual Abuse: Denies  Prior Inpatient Therapy Prior Inpatient Therapy: No Prior Therapy  Facilty/Provider(s): No ([facility], [place], Information systems manager)  Prior Outpatient Therapy Prior Outpatient Therapy: Yes Prior Therapy Dates:  (Last 30 years) Prior Therapy Facilty/Provider(s):  [facility]useum/[facility] curator) Reason for Treatment:  (Schizophrenia)  ADL Screening (condition at time of admission) Patient's cognitive ability adequate to safely complete daily activities?: Yes Patient able to express need for assistance with ADLs?: Yes Independently performs ADLs?: Yes (appropriate for developmental age) Weakness of Legs: None Weakness of Arms/Hands: None  Home Assistive Devices/Equipment Home Assistive Devices/Equipment: None    Abuse/Neglect Assessment (Assessment to be complete while patient is alone) Physical Abuse: Denies Verbal Abuse: Denies Sexual Abuse: Denies Exploitation of patient/patient's resources: Denies Self-Neglect: Yes, present (Comment) (Reported that she is not motivated to perform self care.) Values / Beliefs Cultural Requests During Hospitalization: None Spiritual Requests During Hospitalization: None        Additional Information 1:1 In Past 12 Months?: No CIRT Risk: No Elopement Risk: No Does patient have medical clearance?: No     Disposition: Pt taken to [facility] for medical clearance. Dr. Dan Humphreys accepted pending clearance. Pt will be placed in a 400 bed once one becomes available and the pt is cleared. Disposition Disposition of Patient: Inpatient treatment program (Accepted Dr. Dan Humphreys pending clearance) Type of inpatient treatment program: Adult  On Site Evaluation by:   Reviewed with Physician:     Manual Meier 08/05/2012 8:37 PM

## 2012-08-05 NOTE — ED Notes (Signed)
Pt was brought over by [facility] tech for med clearance. Pt states she has been hearing voices and feels like she may harm herself.  Per [facility] tech, pt has a bed once medically cleared.

## 2012-08-05 NOTE — ED Notes (Signed)
Security here to wand pt and belongings. Belongings include pants shirt shoes bra and panties.

## 2012-08-06 DIAGNOSIS — F3189 Other bipolar disorder: Secondary | ICD-10-CM

## 2012-08-06 DIAGNOSIS — J45909 Unspecified asthma, uncomplicated: Secondary | ICD-10-CM

## 2012-08-06 DIAGNOSIS — J410 Simple chronic bronchitis: Secondary | ICD-10-CM | POA: Diagnosis present

## 2012-08-06 DIAGNOSIS — E876 Hypokalemia: Secondary | ICD-10-CM

## 2012-08-06 DIAGNOSIS — I10 Essential (primary) hypertension: Secondary | ICD-10-CM

## 2012-08-06 DIAGNOSIS — Z720 Tobacco use: Secondary | ICD-10-CM | POA: Diagnosis present

## 2012-08-06 DIAGNOSIS — R45851 Suicidal ideations: Secondary | ICD-10-CM

## 2012-08-06 DIAGNOSIS — E871 Hypo-osmolality and hyponatremia: Principal | ICD-10-CM

## 2012-08-06 LAB — BASIC METABOLIC PANEL
BUN: 7 mg/dL (ref 6–23)
CO2: 37 mEq/L — ABNORMAL HIGH (ref 19–32)
CO2: 30 mEq/L (ref 19–32)
Chloride: 94 mEq/L — ABNORMAL LOW (ref 96–112)
Chloride: 93 mEq/L — ABNORMAL LOW (ref 96–112)
Creatinine, Ser: 0.68 mg/dL (ref 0.50–1.10)
Creatinine, Ser: 0.69 mg/dL (ref 0.50–1.10)

## 2012-08-06 LAB — HEMOGLOBIN A1C: Mean Plasma Glucose: 123 mg/dL — ABNORMAL HIGH (ref <117)

## 2012-08-06 LAB — CBC
HCT: 35.4 % — ABNORMAL LOW (ref 36.0–46.0)
MCV: 69.4 fL — ABNORMAL LOW (ref 78.0–100.0)
RBC: 5.1 MIL/uL (ref 3.87–5.11)
RDW: 13.8 % (ref 11.5–15.5)
WBC: 6.5 10*3/uL (ref 4.0–10.5)

## 2012-08-06 LAB — MAGNESIUM: Magnesium: 2.1 mg/dL (ref 1.5–2.5)

## 2012-08-06 MED ORDER — SODIUM CHLORIDE 0.9 % IV SOLN
INTRAVENOUS | Status: DC
  Administered 2012-08-06: 14:00:00 via INTRAVENOUS

## 2012-08-06 MED ORDER — IPRATROPIUM-ALBUTEROL 18-103 MCG/ACT IN AERO: 2.0000 | INHALATION_SPRAY | Freq: Four times a day (QID) | RESPIRATORY_TRACT | Status: DC | PRN

## 2012-08-06 MED ORDER — POTASSIUM CHLORIDE 10 MEQ/100ML IV SOLN
10.0000 meq | INTRAVENOUS | Status: DC
  Filled 2012-08-06 (×4): qty 100

## 2012-08-06 MED ORDER — POTASSIUM CHLORIDE CRYS ER 20 MEQ PO TBCR
40.0000 meq | EXTENDED_RELEASE_TABLET | ORAL | Status: AC
  Administered 2012-08-06 (×4): 40 meq via ORAL
  Filled 2012-08-06 (×4): qty 2

## 2012-08-06 MED ORDER — IPRATROPIUM-ALBUTEROL 20-100 MCG/ACT IN AERS
1.0000 | INHALATION_SPRAY | Freq: Four times a day (QID) | RESPIRATORY_TRACT | Status: DC | PRN
  Filled 2012-08-06: qty 4

## 2012-08-06 MED ORDER — GUAIFENESIN 100 MG/5ML PO SYRP
200.0000 mg | ORAL_SOLUTION | ORAL | Status: DC | PRN
  Administered 2012-08-06 – 2012-08-07 (×3): 200 mg via ORAL
  Filled 2012-08-06: qty 118

## 2012-08-06 MED ORDER — POTASSIUM CHLORIDE CRYS ER 20 MEQ PO TBCR: 40.0000 meq | EXTENDED_RELEASE_TABLET | Freq: Two times a day (BID) | ORAL | Status: DC

## 2012-08-06 NOTE — Progress Notes (Signed)
Nutrition Brief Note  Patient identified on the Nutrition Risk Report for unintended weight loss greater than 10 pounds in the past month.   Body mass index is 28.07 kg/(m^2). Pt meets criteria for overweight based on current BMI.   - Met with pt who reports eating well PTA, eating 1 meal/day and then snacking on foods throughout the day. Pt reports her weight has been stable around 170 pounds. Pt reports ever since she stopped smoking she has been eating more. Pt reports eating excellent during admission. Noted pt with low potassium, however magnesium and phosphorus WNL. Pt getting oral potassium supplements today. Pt had questions regarding the role of fiber in a healthy diet and sources of fiber - provided handout of this information which was reviewed with pt.   Current diet order is regular, patient is consuming approximately 100% of meals at this time. Labs and medications reviewed.   No nutrition interventions warranted at this time. If nutrition issues arise, please consult RD.   Dietitian# [phone]

## 2012-08-06 NOTE — Consult Note (Signed)
Patient Identification:  Jackie Odonnell Date of Evaluation:  08/06/2012 Reason for Consult: Suicidal Ideation  Referring Provider: Dr.  Gwenlyn Perking  History of Present Illness: Pt is in bed stating that she has been hearing voices to hurt herself.  She tells them she ran out of her medications and voices began.  She is found to have hyponatremia, hypokalemia, hyperglycemia and anemia with MCV< 69.0 Past Psychiatric History:  She says she has been going to [facility] to get her psychiatric meds.  She has taken an overdose a long time ago.    Past Medical History:     Past Medical History  Diagnosis Date  . Hypertension   . Bipolar affective     Chemical Imbalance       Past Surgical History  Procedure Date  . Abdominal hysterectomy     Allergies: No Known Allergies  Current Medications:  Prior to Admission medications   Medication Sig Start Date End Date Taking? Authorizing Provider  haloperidol (HALDOL) 5 MG tablet Take 10 mg by mouth daily.   Yes Historical Provider, MD  lamoTRIgine (LAMICTAL) 150 MG tablet Take 150 mg by mouth daily.   Yes Historical Provider, MD  lisinopril-hydrochlorothiazide (PRINZIDE,ZESTORETIC) 10-12.5 MG per tablet Take 1 tablet by mouth daily.   Yes Historical Provider, MD  metoprolol succinate (TOPROL-XL) 100 MG 24 hr tablet Take 100 mg by mouth daily. Take with or immediately following a meal.   Yes Historical Provider, MD  nicotine (NICODERM CQ - DOSED IN MG/24 HOURS) 21 mg/24hr patch Place 1 patch onto the skin daily.   Yes Historical Provider, MD    Social History:    reports that she has been smoking Cigarettes.  She has a 30 pack-year smoking history. She does not have any smokeless tobacco history on file. She reports that she does not drink alcohol or use illicit drugs.   Family History:    No family history on file.  Mental Status Examination/Evaluation: Objective:  Appearance: Casual  Psychomotor Activity:  Normal  Eye Contact::  Good    Speech:  Clear and Coherent  Volume:  Normal  Mood:  Anxious  Affect:  Congruent and Depressed  Thought Process:  Coherent, Relevant and Intact  Orientation:  Full  Thought Content:  Auditory hallucinations  Suicidal Thoughts:  Yes.  with intent/plan  Homicidal Thoughts:  No  Judgement:  Fair  Insight:  Fair    DIAGNOSIS:   AXIS I   Bipolar Disorder with psychotic features, suicidal ideation  AXIS II  Deffered  AXIS III See medical notes.  AXIS IV economic problems, housing problems, other psychosocial or environmental problems, problems related to social environment and mother provides supportive assistance, pays bills  AXIS V 51-60 moderate symptoms   Assessment/Plan:  Discussed with Dr. Gwenlyn Perking, Psych Pt is awake alert and is oriented to person, place, and situation.  She says the command hallucinations are fading with start of her medication.  She wants to have a therapist to talk too.  She only sees a NP at [facility] She says she was hearing bothersome voices and made her think of jumping off a building.  She denies suicidal ideation and agrees she will not try to hurt herself in room.  She knows her medications and doses.  Notes on adm suggest transfer to [facility]/ RECOMMENDATION:  1.  Pt has capacity to discuss her situation and need for medications.  She knows her medical conditions. 2.  Pt has community connection; request Psych CSW to  confirm open chart at [facility].  3.  Agree with Lamictal and Haldol doses.  NV**Pt has borderline QTc prolongation and Haldol increases risk of effect.  4.  No EPS/TD is noted 5.  Consider discontinue sitter.  Pt denies intent to self-harm 6.  Suggest r/o iron deficiency anemia 7.  Consider transfer to [facility] or comparable fascility when medically stable if suicidal thoughts recur.  Chakira Jachim J. Ferol Luz, MD Psychiatrist  08/06/2012 3:00 PM

## 2012-08-06 NOTE — Progress Notes (Addendum)
[facility] PROGRESS NOTE  Jackie Odonnell GNF:[id number] DOB: [birth year] DOA: 08/05/2012 PCP: Carrie Mew, MD  Assessment/Plan: 1-Suicidal ideation and auditory hallucinations: due to running out of meds. Per psych evaluation and recommendations will continue sitter and resume haldol and lacmital; wait for [facility] bed availability in order for patient to be transferred over there. Patient doing better and currently AAOX3; no SI or hallucinations.  2-Unspecified essential hypertension: patient BP stable w/o meds currently. She had a remote hx of using metorpolol and lisinopril/HCTZ. Will continue holding antihypertensive drugs and will follow VS.  3-Remote hx of ASTHMA: will use PRN combivent if needed; currently w/o any SOB or wheezing.  4-Hyponatremia and hypokalemia: due to poor PO intake. Will replete and continue gentle hydration now. Patient appetite is back and is tolerating full diet.  5-Tobacco abuse: counseling provided. Patient contemplating to quit. Nicotine patch provided  DVT: lovenox   Code Status: Full Family Communication: none at bedside Disposition Plan: [facility] when bed available.   Brief narrative: 51 y/o female with pmh of HTN, bipolar disorder and tobacco abuse; who ran out of meds 2 days ago and now hearing voices, on occasion they tell her to kill herself, she has had this before when off of meds, no self harm, denies any etoh or drug use. Patient reports no PO intake for 3 days.,   Consultants:  Psych  Antibiotics:  none  HPI/Subjective: Afebrile; denies CP, SOB, Suicidal ideation or any other acute complaints. Patient reports intermittent auditory hallucinations (but none present since admission)  Objective: Filed Vitals:   08/06/12 0050 08/06/12 0118 08/06/12 0632 08/06/12 0817  BP: 141/63 110/68 116/71   Pulse: 83 89 80   Temp: 98.9 F (37.2 C) 98.1 F (36.7 C) 98.9 F (37.2 C)   TempSrc: Oral  Oral   Resp: 20 20 18    Height:  5\' 6"   (1.676 m)  5\' 6"  (1.676 m)  Weight:  78.8 kg (173 lb 11.6 oz)  78.881 kg (173 lb 14.4 oz)  SpO2: 94% 100% 94%     Intake/Output Summary (Last 24 hours) at 08/06/12 1305 Last data filed at 08/06/12 1214  Gross per 24 hour  Intake    480 ml  Output      0 ml  Net    480 ml   Filed Weights   08/06/12 0118 08/06/12 0817  Weight: 78.8 kg (173 lb 11.6 oz) 78.881 kg (173 lb 14.4 oz)    Exam:   General:  NAD; denies hallucinations or SI  Cardiovascular: S1 and S2; no rubs or gallops  Respiratory: CTA  Abdomen: soft, NT/ND; positive BS  Neurologic: CN intact; no motor or sensory deficit  Data Reviewed: Basic Metabolic Panel:  Lab 08/06/12 8469 08/06/12 0515 08/06/12 0009 08/05/12 2205  NA 137 138 -- 124*  K 2.8* 2.4* -- 2.1*  CL 93* 94* -- 76*  CO2 30 37* -- 35*  GLUCOSE 122* 108* -- 114*  BUN 7 7 -- 5*  CREATININE 0.69 0.68 -- 0.62  CALCIUM 9.0 8.7 -- 9.3  MG -- -- 2.1 --  PHOS -- -- 3.9 --   CBC:  Lab 08/06/12 0515 08/05/12 2205  WBC 6.5 8.0  NEUTROABS -- --  HGB 11.8* 13.5  HCT 35.4* 39.2  MCV 69.4* 69.0*  PLT 255 295   CBG:  Lab 08/06/12 0733  GLUCAP 108*      Studies: No results found.  Scheduled Meds:   . enoxaparin (LOVENOX) injection  40 mg  Subcutaneous Q24H  . haloperidol  10 mg Oral Daily  . haloperidol  10 mg Oral Once  . lamoTRIgine  150 mg Oral Daily  . lamoTRIgine  150 mg Oral Once  . nicotine  21 mg Transdermal Q24H  . potassium chloride  10 mEq Intravenous Once  . potassium chloride  40 mEq Oral Q4H  . sodium chloride  1,000 mL Intravenous Once  . sodium chloride  3 mL Intravenous Q12H  . DISCONTD: potassium chloride  10 mEq Intravenous Q1 Hr x 4  . DISCONTD: potassium chloride  40 mEq Oral BID  . DISCONTD: potassium chloride  40 mEq Oral BID   Continuous Infusions:   . sodium chloride 75 mL/hr at 08/06/12 0143  . sodium chloride       Time spent: > 30 minutes    Katrina Daddona  [facility] Pager [phone]. If  8PM-8AM, please contact night-coverage at [email or web address], password [facility] 08/06/2012, 1:05 PM  LOS: 1 day

## 2012-08-06 NOTE — Progress Notes (Signed)
Progress noted following consultation Discussion with Dr. Gwenlyn Perking who informs [not noticed in prior hx]  that pt was sent to [facility] by [facility] for medical clearance.  Pt found to be hyponatermia and hypokalemia was admitted to [facility] for correction.  He states that she has a  Correction for hypokalemia and will be checked in am.  Request for [facility] for will be evaluated in am.    Pt to be followed by weekend psychiatric consultant Tagen Milby J. Ferol Luz, MD Psychiatrist  08/06/2012 11:06 PM

## 2012-08-06 NOTE — Progress Notes (Signed)
Clinical Social Work Department CLINICAL SOCIAL WORK PSYCHIATRY SERVICE LINE ASSESSMENT 08/06/2012  Patient:  Jackie Odonnell  Account:  [id number]  Admit Date:  08/05/2012  Clinical Social Worker:  Doroteo Glassman  Date/Time:  08/06/2012 02:40 PM Referred by:  Physician  Date referred:  08/06/2012 Reason for Referral  Behavioral Health Issues   Presenting Symptoms/Problems (In the person's/family's own words):   SI    Abuse/Neglect/Trauma Comments:   Psychiatric History (check all that apply)  Outpatient treatment   Psychiatric medications:  Haldol, Lamictal   Current Mental Health Hospitalizations/Previous Mental Health History:   Current provider:   [facility]   Place and Date:   Current Medications:   See H&P   Previous Impatient Admission/Date/Reason:   Denies   Emotional Health / Current Symptoms    Suicide/Self Harm  Suicidal ideation (ex: "I can't take any more,I wish I could disappear")  Suicide attempt in past (date/description)   Suicide attempt in the past:   Pt reports that, "some time ago," she OD on "a few pills and I didn't tell anyone."  Pt states that she "just let the pills wear off."   Other harmful behavior:   Psychotic/Dissociative Symptoms  Auditory Hallucinations   Other Psychotic/Dissociative Symptoms:   Pt states, "They're wearing off now."  States she was hearing them regularly.    Attention/Behavioral Symptoms  Within Normal Limits   Other Attention / Behavioral Symptoms:    Cognitive Impairment  Within Normal Limits   Other Cognitive Impairment:    Mood and Adjustment  Flat    Stress, Anxiety, Trauma, Any Recent Loss/Stressor  None reported   Anxiety (frequency):   Phobia (specify):   Compulsive behavior (specify):   Obsessive behavior (specify):   Other:   Substance Abuse/Use  None   SBIRT completed (please refer for detailed history):  N  Self-reported substance use:   Urinary Drug Screen Completed:   N Alcohol level:    Environmental/Housing/Living Arrangement  Stable housing   Who is in the home:   Emergency contact:  Sister and mother   Financial  Medicaid   Patient's Strengths and Goals (patient's own words):   Clinical Social Worker's Interpretive Summary:   Met with Pt to discuss current admission.    Pt states that she she presented to [facility] due to SI and that they sent her to [facility] for medical clearance.  Pt stated that she feels that she needs to go back to [facility] due to SI.    Pt stated that she has experienced SI in the past and that she acted on them by overdosing: she took "too many pills". She stated that she didn't require medical attention and that she let the effects wear off.    Pt states that she's never been married and has no children.  Her mom and sister are her primary supports.  Pt states that she's been denied SSI several times and that she intends to hire an attorney again to assist her with the application process.    CSW thanked Pt for her time.   Disposition:  Recommend Psych CSW continuing to support while in hospital  CSW to continue to follow.  Providence Crosby, LCSWA Clinical Social Work [phone]

## 2012-08-06 NOTE — Progress Notes (Signed)
Notified by [facility] that there no 400-hall beds available.  Notified MD and Pt.  Weekend CSW to f/u.  Providence Crosby, LCSWA Clinical Social Work [phone]

## 2012-08-06 NOTE — H&P (Addendum)
[facility] History and Physical  Jackie Odonnell WUX:[id number] DOB: 1961/02/02 DOA: 08/05/2012  Referring physician: ED physician PCP: Carrie Mew, MD   Chief Complaint: Suicidal ideation   HPI:  Pt is 51 yo female who present to [facility] with main concerns of suicidal ideation that initially started several days prior to admission. She is unwillingly providing some parts of the history but clear details I am not able to obtain. In ED she was found to be hyponatremic and hypokalemic and needs medical clearance for transfer to [facility] once medically resolved. Pt apparently did not experience any fevers, chills, abdominal or urinary concerns, no similar events apparently in the past.  Assessment and Plan: Principal Problem:  *Suicidal ideation - once electrolytes are stable, consider discharging to [facility] - will place SW consult for further assistance with discharge   Active Problems:  Hyponatremia - possibly related to antiepileptic treatment vs pre renal etiology in the setting of poor oral intake - BMP in AM - TSH in AM   Hypokalemia - supplement - BMP in AM - will check Mg level   Unspecified essential hypertension - continue to monitor vitals per floor protocol  Code Status: Full Family Communication: Pt at bedside Disposition Plan: To [facility] when bed available and when medically stable   Review of Systems:  Unable to obtain as pt not willing to try and participate in conversation    Past Medical History  Diagnosis Date  . Hypertension   . Bipolar affective     Chemical Imbalance    Past Surgical History  Procedure Date  . Abdominal hysterectomy     Social History:  reports that she has been smoking Cigarettes.  She has a 30 pack-year smoking history. She does not have any smokeless tobacco history on file. She reports that she does not drink alcohol or use illicit drugs.  No Known Allergies  No family history of heart problems, or cancers.   Prior to  Admission medications   Medication Sig Start Date End Date Taking? Authorizing Provider  haloperidol (HALDOL) 5 MG tablet Take 10 mg by mouth daily.   Yes Historical Provider, MD  lamoTRIgine (LAMICTAL) 150 MG tablet Take 150 mg by mouth daily.   Yes Historical Provider, MD  nicotine (NICODERM CQ - DOSED IN MG/24 HOURS) 21 mg/24hr patch Place 1 patch onto the skin daily.   Yes Historical Provider, MD    Physical Exam: Filed Vitals:   08/05/12 2134  BP: 133/78  Pulse: 80  Temp: 98.3 F (36.8 C)  TempSrc: Oral  Resp: 24  SpO2: 99%    Physical Exam  Constitutional: Appears in no distress.  HENT: Normocephalic. External right and left ear normal. Oropharynx is clear and moist.  Eyes: Conjunctivae and EOM are normal. PERRLA, no scleral icterus.  Neck: Normal ROM. Neck supple. No JVD. No tracheal deviation. No thyromegaly.  CVS: RRR, S1/S2 +, no murmurs, no gallops, no carotid bruit.  Pulmonary: Effort and breath sounds normal, no stridor, rhonchi, wheezes, rales.  Abdominal: Soft. BS +,  no distension, tenderness, rebound or guarding.  Musculoskeletal: Normal range of motion. No edema and no tenderness.  Lymphadenopathy: No lymphadenopathy noted, cervical, inguinal. Neuro: Alert. Normal reflexes, muscle tone coordination. No cranial nerve deficit. Skin: Skin is warm and dry. No rash noted. Not diaphoretic. No erythema. No pallor.  Psychiatric: Flat affect, not willing to communicate at this time  Labs on Admission:  Basic Metabolic Panel:  Lab 08/05/12 2536  NA 124*  K  2.1*  CL 76*  CO2 35*  GLUCOSE 114*  BUN 5*  CREATININE 0.62  CALCIUM 9.3  MG --  PHOS --   CBC:  Lab 08/05/12 2205  WBC 8.0  NEUTROABS --  HGB 13.5  HCT 39.2  MCV 69.0*  PLT 295   EKG: Normal sinus rhythm, no ST/T wave changes  Debbora Presto, MD  [facility] Pager [phone]  If 7PM-7AM, please contact night-coverage [email or web address] Password [email or web address] 08/06/2012, 12:00  AM

## 2012-08-06 NOTE — ED Provider Notes (Addendum)
History     CSN: [id number]  Arrival date & time 08/05/12  2117   First MD Initiated Contact with Patient 08/05/12 2329      Chief Complaint  Patient presents with  . Medical Clearance    (Consider location/radiation/quality/duration/timing/severity/associated sxs/prior treatment) HPI Hx per PT, ran out of meds 2 days ago and now hearing voices, on occasion they tell her to kill herself, she has had this before when off of meds, no self harm, denies any etoh or drug use, mod in severity. No HI. PT recognizes these symptoms and presents here requesting her daily medicines. She lives alone, has family in town. Past Medical History  Diagnosis Date  . Hypertension   . Bipolar affective     Chemical Imbalance    Past Surgical History  Procedure Date  . Abdominal hysterectomy     No family history on file.  History  Substance Use Topics  . Smoking status: Current Everyday Smoker -- 2.0 packs/day for 15 years    Types: Cigarettes  . Smokeless tobacco: Not on file  . Alcohol Use: No    OB History    Grav Para Term Preterm Abortions TAB SAB Ect Mult Living                  Review of Systems  Constitutional: Negative for fever and chills.  HENT: Negative for neck pain and neck stiffness.   Eyes: Negative for pain.  Respiratory: Negative for shortness of breath.   Cardiovascular: Negative for chest pain.  Gastrointestinal: Negative for abdominal pain.  Genitourinary: Negative for dysuria.  Musculoskeletal: Negative for back pain.  Skin: Negative for rash.  Neurological: Negative for headaches.  All other systems reviewed and are negative.    Allergies  Review of patient's allergies indicates no known allergies.  Home Medications   Current Outpatient Rx  Name Route Sig Dispense Refill  . HALOPERIDOL 5 MG PO TABS Oral Take 10 mg by mouth daily.    Marland Kitchen LAMOTRIGINE 150 MG PO TABS Oral Take 150 mg by mouth daily.    Marland Kitchen NICOTINE 21 MG/24HR TD PT24 Transdermal Place 1  patch onto the skin daily.      BP 133/78  Pulse 80  Temp 98.3 F (36.8 C) (Oral)  Resp 24  SpO2 99%  Physical Exam  Constitutional: She is oriented to person, place, and time. She appears well-developed and well-nourished.  HENT:  Head: Normocephalic and atraumatic.  Eyes: Conjunctivae and EOM are normal. Pupils are equal, round, and reactive to light.  Neck: Trachea normal. Neck supple. No thyromegaly present.  Cardiovascular: Normal rate, regular rhythm, S1 normal, S2 normal and normal pulses.     No systolic murmur is present   No diastolic murmur is present  Pulses:      Radial pulses are 2+ on the right side, and 2+ on the left side.  Pulmonary/Chest: Effort normal and breath sounds normal. She has no wheezes. She has no rhonchi. She has no rales. She exhibits no tenderness.  Abdominal: Soft. Normal appearance and bowel sounds are normal. There is no tenderness. There is no CVA tenderness and negative Murphy's sign.  Musculoskeletal:       BLE:s Calves nontender, no cords or erythema, negative Homans sign  Neurological: She is alert and oriented to person, place, and time. She has normal strength. No cranial nerve deficit or sensory deficit. GCS eye subscore is 4. GCS verbal subscore is 5. GCS motor subscore is 6.  Skin:  Skin is warm and dry. No rash noted. She is not diaphoretic.  Psychiatric: Her speech is normal.       Cooperative and appropriate, does not appear to be actively halucinating    ED Course  Procedures (including critical care time)  Results for orders placed during the hospital encounter of 08/05/12  URINALYSIS, ROUTINE W REFLEX MICROSCOPIC      Component Value Range   Color, Urine YELLOW  YELLOW   APPearance CLEAR  CLEAR   Specific Gravity, Urine 1.004 (*) 1.005 - 1.030   pH 6.0  5.0 - 8.0   Glucose, UA NEGATIVE  NEGATIVE mg/dL   Hgb urine dipstick NEGATIVE  NEGATIVE   Bilirubin Urine NEGATIVE  NEGATIVE   Ketones, ur NEGATIVE  NEGATIVE mg/dL    Protein, ur NEGATIVE  NEGATIVE mg/dL   Urobilinogen, UA 0.2  0.0 - 1.0 mg/dL   Nitrite NEGATIVE  NEGATIVE   Leukocytes, UA NEGATIVE  NEGATIVE  CBC      Component Value Range   WBC 8.0  4.0 - 10.5 K/uL   RBC 5.68 (*) 3.87 - 5.11 MIL/uL   Hemoglobin 13.5  12.0 - 15.0 g/dL   HCT 16.1  09.6 - 04.5 %   MCV 69.0 (*) 78.0 - 100.0 fL   MCH 23.8 (*) 26.0 - 34.0 pg   MCHC 34.4  30.0 - 36.0 g/dL   RDW 40.9  81.1 - 91.4 %   Platelets 295  150 - 400 K/uL  BASIC METABOLIC PANEL      Component Value Range   Sodium 124 (*) 135 - 145 mEq/L   Potassium 2.1 (*) 3.5 - 5.1 mEq/L   Chloride 76 (*) 96 - 112 mEq/L   CO2 35 (*) 19 - 32 mEq/L   Glucose, Bld 114 (*) 70 - 99 mg/dL   BUN 5 (*) 6 - 23 mg/dL   Creatinine, Ser 7.82  0.50 - 1.10 mg/dL   Calcium 9.3  8.4 - 95.6 mg/dL   GFR calc non Af Amer >90  >90 mL/min   GFR calc Af Amer >90  >90 mL/min  ACETAMINOPHEN LEVEL      Component Value Range   Acetaminophen (Tylenol), Serum <15.0  10 - 30 ug/mL  SALICYLATE LEVEL      Component Value Range   Salicylate Lvl <2.0 (*) 2.8 - 20.0 mg/dL  ETHANOL      Component Value Range   Alcohol, Ethyl (B) <11  0 - 11 mg/dL     1. Hyponatremia   2. Hypokalemia   3. Suicidal ideation   4. Unspecified asthma     Labs reviewed as above. Home medications provided. For sig electrolyte abnormality, MEd c/s for admit. Dr Izola Price agrees to MED admission.   IV fluids provided. Potassium provided. MDM   Nursing notes reviewed. Vital signs reviewed. Old records reviewed. Home medications provided. Labs reviewed as above and medical admission.        Sunnie Nielsen, MD 08/06/12 0040    Date: 10/01/2012  Rate: 81  Rhythm: NSR  QRS Axis: Nl  Intervals: Nl  ST/T Wave abnormalities: NST  Conduction Disutrbances: None  Narrative Interpretation:   Old EKG Reviewed: none available      Sunnie Nielsen, MD 10/01/12 1109

## 2012-08-06 NOTE — Progress Notes (Signed)
On call made aware that pt has a critical potassium of 2.4

## 2012-08-06 NOTE — BH Assessment (Signed)
[facility] Assessment Progress Note      Potassium level is low at 2.9 and [name] not comfortable with this low level. Asking that it be re checked and re run tomorrow when it is higher and she is medically more stable. Jackie Odonnell

## 2012-08-07 ENCOUNTER — Encounter (HOSPITAL_COMMUNITY): Payer: Self-pay | Admitting: *Deleted

## 2012-08-07 ENCOUNTER — Inpatient Hospital Stay (HOSPITAL_COMMUNITY)
Admission: RE | Admit: 2012-08-07 | Discharge: 2012-08-16 | DRG: 885 | Disposition: A | Discharge status: Home / Self Care | Payer: Medicaid Other | Source: Ambulatory Visit | Attending: Psychiatry | Admitting: Psychiatry

## 2012-08-07 ENCOUNTER — Telehealth (HOSPITAL_COMMUNITY): Payer: Self-pay | Admitting: Behavioral Health

## 2012-08-07 DIAGNOSIS — F200 Paranoid schizophrenia: Principal | ICD-10-CM | POA: Diagnosis present

## 2012-08-07 DIAGNOSIS — Z720 Tobacco use: Secondary | ICD-10-CM

## 2012-08-07 DIAGNOSIS — I10 Essential (primary) hypertension: Secondary | ICD-10-CM | POA: Diagnosis present

## 2012-08-07 DIAGNOSIS — R45851 Suicidal ideations: Secondary | ICD-10-CM

## 2012-08-07 DIAGNOSIS — J45909 Unspecified asthma, uncomplicated: Secondary | ICD-10-CM

## 2012-08-07 DIAGNOSIS — E871 Hypo-osmolality and hyponatremia: Secondary | ICD-10-CM

## 2012-08-07 DIAGNOSIS — F17200 Nicotine dependence, unspecified, uncomplicated: Secondary | ICD-10-CM

## 2012-08-07 DIAGNOSIS — E876 Hypokalemia: Secondary | ICD-10-CM

## 2012-08-07 LAB — BASIC METABOLIC PANEL
BUN: 9 mg/dL (ref 6–23)
Chloride: 101 mEq/L (ref 96–112)
GFR calc Af Amer: >90 mL/min (ref >90)
GFR calc non Af Amer: >90 mL/min (ref >90)
Glucose, Bld: 81 mg/dL (ref 70–99)
Potassium: 3.3 mEq/L — ABNORMAL LOW (ref 3.5–5.1)
Sodium: 138 mEq/L (ref 135–145)

## 2012-08-07 LAB — GLUCOSE, CAPILLARY

## 2012-08-07 MED ORDER — POTASSIUM CHLORIDE CRYS ER 20 MEQ PO TBCR
40.0000 meq | EXTENDED_RELEASE_TABLET | ORAL | Status: DC
  Administered 2012-08-07 (×2): 40 meq via ORAL
  Filled 2012-08-07 (×3): qty 2

## 2012-08-07 MED ORDER — POTASSIUM CHLORIDE 20 MEQ PO PACK
10.0000 meq | PACK | Freq: Every day | ORAL | Status: DC
  Filled 2012-08-07: qty 1

## 2012-08-07 MED ORDER — LISINOPRIL-HYDROCHLOROTHIAZIDE 10-12.5 MG PO TABS: 1.0000 | ORAL_TABLET | Freq: Every day | ORAL | Status: DC

## 2012-08-07 MED ORDER — LAMOTRIGINE 100 MG PO TABS
150.0000 mg | ORAL_TABLET | Freq: Every day | ORAL | Status: DC
  Administered 2012-08-08 – 2012-08-15 (×8): 150 mg via ORAL
  Filled 2012-08-07 (×5): qty 1
  Filled 2012-08-07: qty 42
  Filled 2012-08-07 (×4): qty 1

## 2012-08-07 MED ORDER — METOPROLOL SUCCINATE ER 100 MG PO TB24
100.0000 mg | ORAL_TABLET | Freq: Every day | ORAL | Status: DC
  Administered 2012-08-08 – 2012-08-16 (×9): 100 mg via ORAL
  Filled 2012-08-07 (×5): qty 1
  Filled 2012-08-07: qty 14
  Filled 2012-08-07 (×5): qty 1

## 2012-08-07 MED ORDER — HALOPERIDOL 5 MG PO TABS
10.0000 mg | ORAL_TABLET | Freq: Every day | ORAL | Status: DC
  Filled 2012-08-07: qty 2

## 2012-08-07 MED ORDER — POTASSIUM CHLORIDE CRYS ER 20 MEQ PO TBCR: 20.0000 meq | EXTENDED_RELEASE_TABLET | Freq: Every day | ORAL | Status: DC

## 2012-08-07 MED ORDER — ACETAMINOPHEN 325 MG PO TABS
650.0000 mg | ORAL_TABLET | Freq: Four times a day (QID) | ORAL | Status: DC | PRN
  Administered 2012-08-08: 650 mg via ORAL

## 2012-08-07 MED ORDER — AMLODIPINE BESYLATE 5 MG PO TABS
5.0000 mg | ORAL_TABLET | Freq: Every day | ORAL | Status: DC
  Filled 2012-08-07: qty 1

## 2012-08-07 MED ORDER — LISINOPRIL 10 MG PO TABS
10.0000 mg | ORAL_TABLET | Freq: Every day | ORAL | Status: DC
  Administered 2012-08-08 – 2012-08-16 (×9): 10 mg via ORAL
  Filled 2012-08-07 (×4): qty 1
  Filled 2012-08-07: qty 14
  Filled 2012-08-07 (×6): qty 1

## 2012-08-07 MED ORDER — ALUM & MAG HYDROXIDE-SIMETH 200-200-20 MG/5ML PO SUSP: 30.0000 mL | ORAL | Status: DC | PRN

## 2012-08-07 MED ORDER — IPRATROPIUM-ALBUTEROL 20-100 MCG/ACT IN AERS: 1.0000 | INHALATION_SPRAY | Freq: Four times a day (QID) | RESPIRATORY_TRACT | Status: DC | PRN

## 2012-08-07 MED ORDER — NICOTINE 21 MG/24HR TD PT24
21.0000 mg | MEDICATED_PATCH | Freq: Every day | TRANSDERMAL | Status: DC
  Administered 2012-08-08 – 2012-08-16 (×9): 21 mg via TRANSDERMAL
  Filled 2012-08-07 (×12): qty 1

## 2012-08-07 MED ORDER — MAGNESIUM HYDROXIDE 400 MG/5ML PO SUSP: 30.0000 mL | Freq: Every day | ORAL | Status: DC | PRN

## 2012-08-07 MED ORDER — HYDROCHLOROTHIAZIDE 12.5 MG PO CAPS
12.5000 mg | ORAL_CAPSULE | Freq: Every day | ORAL | Status: DC
  Administered 2012-08-08 – 2012-08-16 (×9): 12.5 mg via ORAL
  Filled 2012-08-07 (×5): qty 1
  Filled 2012-08-07: qty 14
  Filled 2012-08-07 (×5): qty 1

## 2012-08-07 MED ORDER — TRAZODONE HCL 50 MG PO TABS
50.0000 mg | ORAL_TABLET | Freq: Every evening | ORAL | Status: DC | PRN
  Administered 2012-08-07: 50 mg via ORAL
  Filled 2012-08-07: qty 1

## 2012-08-07 MED ORDER — HALOPERIDOL 5 MG PO TABS
10.0000 mg | ORAL_TABLET | Freq: Every day | ORAL | Status: DC
  Administered 2012-08-08 – 2012-08-15 (×8): 10 mg via ORAL
  Filled 2012-08-07 (×5): qty 2
  Filled 2012-08-07: qty 14
  Filled 2012-08-07 (×4): qty 2

## 2012-08-07 NOTE — Progress Notes (Signed)
Patient ID: Jackie Odonnell, female   DOB: 12-Jul-1961, 51 y.o.   MRN: [id number] 08-17-143 @ 1604 nursing adm note: pt came to [facility] voluntarily with an admitting dx of paranoid schizophrenia. She stated she has been non complaint with home meds for about 3 days. She stated she is hearing voices that are telling her to kill herself and they will not stop. Pt  actively endorsed si at the hospital, but the si seems more passive on admission. She was able to contract verbally for the suicide ideation. She denied any hi and any visual hallucinations. Pt has a hx of previous suicide attempts. She seems paranoid and very guarded. She had stated previous to this adm that " people are putting poison in my food". She has a medical hx of htn and a hysterectomy. She had no c/o pain, no allergies, never had seizure and is a smoker. She has on a nicotine patch. She denied any etoh or illegal drug use. Her labs are: 8-17  bmet wnl except potassium at 3.3 low; 8-16 cbc=abnormal; 08-15 etoh <11; 8-16 magnesium 2.1; 08-16 phosphorus 3.9; 08-16 tsh 0.811; 08-16 hemoglobin a1c 5-9 high; this pt was polite/cooperative and escorted to the 400 hall.   Contact person: caycee wanat- sister at ph # [phone] Pharmacy [facility] at ph # [phone]

## 2012-08-07 NOTE — Tx Team (Signed)
Initial Interdisciplinary Treatment Plan  PATIENT STRENGTHS: (choose at least two) Supportive family/friends  PATIENT STRESSORS: Financial difficulties Medication change or noncompliance   PROBLEM LIST: Problem List/Patient Goals Date to be addressed Date deferred Reason deferred Estimated date of resolution  Psychosis= paranoid,  auditory hallucinations 08-07-12           Passive suicide ideation  08-07-12                                          DISCHARGE CRITERIA:  Improved stabilization in mood, thinking, and/or behavior Reduction of life-threatening or endangering symptoms to within safe limits  PRELIMINARY DISCHARGE PLAN: Attend aftercare/continuing care group Return to previous living arrangement  PATIENT/FAMIILY INVOLVEMENT: This treatment plan has been presented to and reviewed with the patient, Jackie Odonnell, and/or family member, .  The patient and family have been given the opportunity to ask questions and make suggestions.  Valente David 08/07/2012, 4:04 PM

## 2012-08-07 NOTE — Progress Notes (Signed)
[facility] Group Notes:  (Counselor/Nursing/MHT/Case Management/Adjunct)  08/07/2012 6:27 PM  Type of Therapy:  Group Therapy  Participation Level:  Did Not Attend  Summary of Progress/Problems:  Pt did not attend group.  Berlin Hun, MSW, LCSW 08/07/2012, 6:27 PM

## 2012-08-07 NOTE — Discharge Summary (Signed)
Physician Discharge Summary  Jackie Odonnell WUJ:[id number] DOB: October 24, 1961 DOA: 08/05/2012  PCP: Carrie Mew, MD  Admit date: 08/05/2012 Discharge date: 08/07/2012  Recommendations for Outpatient Follow-up:  1. Follow up with PCP in 2 weeks for further evaluation of her chronic conditions and medication adjustments. 2. Keep patient well hydrated 3. Recheck BMET in 2-3 days to reevaluate electrolytes and needs of further repletion.   Discharge Diagnoses:  Principal Problem:  *Suicidal ideation Active Problems:  Unspecified essential hypertension  ASTHMA  Hyponatremia  Hypokalemia  Tobacco abuse   Discharge Condition: stable and improved to be discharged to [facility] for further evaluation and treatment of her SI and hallucinations.  Diet recommendation: heart healthy diet and high fiber  Filed Weights   08/06/12 0118 08/06/12 0817 08/07/12 0623  Weight: 78.8 kg (173 lb 11.6 oz) 78.881 kg (173 lb 14.4 oz) 81.149 kg (178 lb 14.4 oz)    History of present illness:  51 yo female who present to [facility] with main concerns of suicidal ideation that initially started several days prior to admission. She is unwillingly providing some parts of the history but clear details I am not able to obtain. In ED she was found to be hyponatremic and hypokalemic and needs medical clearance for transfer to [facility] once medically resolved. Pt apparently did not experience any fevers, chills, abdominal or urinary concerns, no similar events apparently in the past.   Hospital Course:  1-Suicidal ideation and auditory hallucinations: due to running out of meds. Per psych evaluation and recommendations will continue haldol and lacmital and will transferred to [facility] for further evaluation and treatment. Currently AAOX3; no SI or hallucinations.   2-Unspecified essential hypertension: patient BP is now rising since patient rehydrated again. Will avoid diuretics to prevent electrolytes impairments and also B-blockers  due to hx of asthma. Will use norvasc for BP control and heart healthy diet. Follow up with PCP at discharge for follow up and further medication adjustments.  3-Remote hx of ASTHMA: will continue PRN combivent; currently w/o any SOB or wheezing. Patient advised to stop smoking  4-Hyponatremia and hypokalemia: due to poor PO intake, mild functional diarrhea and use of diuretics. Patient appetite is back and is tolerating diet. Will use daily potassium supplementation for the next couple of days and reevaluate electrolytes level with BMET in 2-3 days. Patient advised to follow high fiber diet.  5-Tobacco abuse: counseling provided. Patient contemplating to quit. Nicotine patch provided    Consultations:  Psych  Discharge Exam: Filed Vitals:   08/07/12 1356  BP: 128/76  Pulse: 92  Temp: 98 F (36.7 C)  Resp: 16   Filed Vitals:   08/06/12 0817 08/06/12 1418 08/07/12 0623 08/07/12 1356  BP:  110/68 135/84 128/76  Pulse:  104 94 92  Temp:  98.3 F (36.8 C) 98.4 F (36.9 C) 98 F (36.7 C)  TempSrc:  Oral Oral Oral  Resp:  20 16 16   Height: 5\' 6"  (1.676 m)     Weight: 78.881 kg (173 lb 14.4 oz)  81.149 kg (178 lb 14.4 oz)   SpO2:  95% 98% 94%    General: NAD; cooperative to examination and calm Cardiovascular: RRR, no rubs or gallops Respiratory: CTA, no wheezing Abdominal:soft, NT, ND; positive BS Extremities: no edema, cyanosis or clubbing Psych:no hallucinations and no SI Neurologic:AAOX3; CN intact; no focal motor deficit  Discharge Instructions  Discharge Orders    Future Orders Please Complete By Expires   Discharge instructions  Comments:   -Take medications as prescribed -Keep yourself well hydrated -Follow a heart healthy and high fiber diet -Recheck BMET in 2-3 days to follow on electrolytes     Medication List  As of 08/07/2012  2:35 PM   STOP taking these medications         lisinopril-hydrochlorothiazide 10-12.5 MG per tablet      metoprolol  succinate 100 MG 24 hr tablet         TAKE these medications         haloperidol 5 MG tablet   Commonly known as: HALDOL   Take 10 mg by mouth daily.      Ipratropium-Albuterol 20-100 MCG/ACT Aers respimat   Commonly known as: COMBIVENT   Inhale 1 puff into the lungs every 6 (six) hours as needed for wheezing or shortness of breath (refractory cough).      lamoTRIgine 150 MG tablet   Commonly known as: LAMICTAL   Take 150 mg by mouth daily.      nicotine 21 mg/24hr patch   Commonly known as: NICODERM CQ - dosed in mg/24 hours   Place 1 patch onto the skin daily.      potassium chloride SA 20 MEQ tablet   Commonly known as: K-DUR,KLOR-CON   Take 1 tablet (20 mEq total) by mouth daily.           Follow-up Information    Follow up with Carrie Mew, MD in 2 weeks. (after discharge from [facility]; for further treatment of her chronic conditions)    Contact information:   [address] [place] [address] [phone]           The results of significant diagnostics from this hospitalization (including imaging, microbiology, ancillary and laboratory) are listed below for reference.      Labs: Basic Metabolic Panel:  Lab 08/07/12 8295 08/06/12 1134 08/06/12 0515 08/06/12 0009 08/05/12 2205  NA 138 137 138 -- 124*  K 3.3* 2.8* 2.4* -- 2.1*  CL 101 93* 94* -- 76*  CO2 29 30 37* -- 35*  GLUCOSE 81 122* 108* -- 114*  BUN 9 7 7  -- 5*  CREATININE 0.76 0.69 0.68 -- 0.62  CALCIUM 8.9 9.0 8.7 -- 9.3  MG -- -- -- 2.1 --  PHOS -- -- -- 3.9 --   CBC:  Lab 08/06/12 0515 08/05/12 2205  WBC 6.5 8.0  NEUTROABS -- --  HGB 11.8* 13.5  HCT 35.4* 39.2  MCV 69.4* 69.0*  PLT 255 295   CBG:  Lab 08/07/12 0720 08/06/12 0733  GLUCAP 83 108*    Time coordinating discharge: >30 minutes  Signed:  Ash Mcelwain  [facility] 08/07/2012, 2:35 PM

## 2012-08-07 NOTE — BH Assessment (Signed)
Assessment Note   Jackie Odonnell is a 51 y.o. AA female. Pt was accepted to [facility] 08/07/12 by Shelda Jakes, PA, to Dr. Allena Katz, for 400 hall bed. Pt was transported from [facility] 08/07/12 from a medical floor where she was receiving care for being, "hyponatremic and hypokalemic" per internal medicine note on 08/06/12. Pt originally came to the [facility] with her mother and sister on 08/05/12 due to hearing persistent voices that were telling her to kill herself. Pt confirmed SI with intent and plan and was unable to contract for safety. Pt's plan was to jump off the balcony, take pills, or cut herself with knives and scissors. Pt lives alone in her apartment and is unemployed and unable to work. Recent stressors include: pt has been turned down for the 3rd time for disability, not taking her blood pressure meds, worries about paying for her medication, apartment and utilities, and in danger of being evicted from apartment due to lack of money. Pt is currently emotionally and financially supported by her mother, sister and brother. Pt states she's sleeping fewer than 8 hrs and stated in one assessment she is fluctuating in weight but in another documented note said she had not changed in her weight. Pt is eating about 1 meal daily with snacks and was stated to be eating meals while she was on the med floor at [facility]. In a note on 08/06/12 was documented as saying her command hallucinations were fading since she started back on medication but still endorsed SI if she returned home. Pt stated, "I can't take any more, I wish I could disappear."    Axis I: 295.30 Schizophrenia, paranoid type Axis II: 799.9 Deferred Axis III:  Past Medical History  Diagnosis Date  . Hypertension   . Bipolar affective     Chemical Imbalance   Axis IV: economic problems, occupational problems, problems related to social environment and problems with access to health care services Axis V: 25  Past Medical History:  Past Medical  History  Diagnosis Date  . Hypertension   . Bipolar affective     Chemical Imbalance    Past Surgical History  Procedure Date  . Abdominal hysterectomy     Family History: No family history on file.  Social History:  reports that she has been smoking Cigarettes.  She has a 30 pack-year smoking history. She does not have any smokeless tobacco history on file. She reports that she does not drink alcohol or use illicit drugs.  Additional Social History:  Alcohol / Drug Use History of alcohol / drug use?: No history of alcohol / drug abuse  CIWA:   COWS:    Allergies: No Known Allergies  Home Medications:  (Not in a hospital admission)  OB/GYN Status:  No LMP recorded. Patient has had a hysterectomy.  General Assessment Data Location of Assessment: [facility] Assessment Services Living Arrangements: Alone Can pt return to current living arrangement?: Yes Admission Status: Voluntary Is patient capable of signing voluntary admission?: Yes Transfer from: Acute Hospital Referral Source: Medical Floor Inpatient  Education Status Is patient currently in school?: No  Risk to self Suicidal Ideation: Yes-Currently Present Suicidal Intent: Yes-Currently Present Is patient at risk for suicide?: Yes Suicidal Plan?: Yes-Currently Present Specify Current Suicidal Plan: Pt states she would jump off her balcony or overdose on pills Access to Means: Yes Specify Access to Suicidal Means: At home pt has pills What has been your use of drugs/alcohol within the last 12 months?: None Previous  Attempts/Gestures: Yes How many times?: 1  Other Self Harm Risks: Smokes 2 packs of cigarettes daily Triggers for Past Attempts: Unknown (Pt is unsure of past triggers) Intentional Self Injurious Behavior: None Family Suicide History: No Recent stressful life event(s): Financial Problems (Pt denied disability for the 3rd time) Persecutory voices/beliefs?: Yes Depression: Yes Depression Symptoms:  Insomnia;Isolating;Fatigue;Loss of interest in usual pleasures Substance abuse history and/or treatment for substance abuse?: No Suicide prevention information given to non-admitted patients: Not applicable  Risk to Others Homicidal Ideation: No Thoughts of Harm to Others: No Current Homicidal Intent: No Current Homicidal Plan: No Access to Homicidal Means: No Identified Victim: n/a History of harm to others?: No Assessment of Violence: None Noted Violent Behavior Description: n/a Does patient have access to weapons?: No Criminal Charges Pending?: No Does patient have a court date: No  Psychosis Hallucinations: Auditory Delusions: None noted  Mental Status Report Appear/Hygiene: Poor hygiene;Disheveled Eye Contact: Poor Motor Activity: Unremarkable Speech: Slow Level of Consciousness: Alert Mood: Worthless, low self-esteem;Sad Affect: Other (Comment) (Flat, with out emotion) Anxiety Level: Severe Thought Processes: Relevant Judgement: Impaired Orientation: Person;Place;Time;Situation Obsessive Compulsive Thoughts/Behaviors: Severe  Cognitive Functioning Concentration: Decreased Memory: Recent Impaired;Remote Impaired IQ: Average Insight: Poor Impulse Control: Poor Appetite: Fair Weight Loss: 0  Weight Gain: 0  Sleep: Decreased Total Hours of Sleep:  (Less than 8 hrs) Vegetative Symptoms: None  ADLScreening [facility] Assessment Services) Patient's cognitive ability adequate to safely complete daily activities?: Yes Patient able to express need for assistance with ADLs?: Yes Independently performs ADLs?: Yes (appropriate for developmental age)  Abuse/Neglect [facility]) Physical Abuse: Denies Verbal Abuse: Denies Sexual Abuse: Denies  Prior Inpatient Therapy Prior Inpatient Therapy: No  Prior Outpatient Therapy Prior Outpatient Therapy: Yes Prior Therapy Dates: The past 30 yrs to present Prior Therapy Facilty/Provider(s): [facility] Reason for Treatment:  Schizophrenia  ADL Screening (condition at time of admission) Patient's cognitive ability adequate to safely complete daily activities?: Yes Patient able to express need for assistance with ADLs?: Yes Independently performs ADLs?: Yes (appropriate for developmental age)       Abuse/Neglect Assessment (Assessment to be complete while patient is alone) Physical Abuse: Denies Verbal Abuse: Denies Sexual Abuse: Denies Exploitation of patient/patient's resources: Denies Self-Neglect: Denies       Nutrition Screen Diet: NPO Unintentional weight loss greater than 10lbs within the last month: No Problems chewing or swallowing foods and/or liquids: No Home Tube Feeding or Total Parenteral Nutrition (TPN): No Patient appears severely malnourished: No Pregnant or Lactating: No  Additional Information 1:1 In Past 12 Months?: No CIRT Risk: No Elopement Risk: No Does patient have medical clearance?: No     Disposition: Pt accepted to [facility] by Verne Spurr, PA, to Readling, [place], for 400 hall bed. Disposition Disposition of Patient: Referred to;Inpatient treatment program Type of inpatient treatment program: Adult Patient referred to: Other (Comment) (Pt accepted at [facility] by Lloyd Huger to Readling for 400 hall)  On Site Evaluation by:   Reviewed with Physician:     Raynald Blend 08/07/2012 2:36 PM

## 2012-08-07 NOTE — Consult Note (Signed)
Patient Identification:  Jackie Odonnell Date of Evaluation:  08/07/2012 Reason for Consult: Suicidal Ideation  Referring Provider: Dr.  Gwenlyn Perking  History of Present Illness: Pt is in bed stating that she has been hearing voices to hurt herself.  She tells them she ran out of her medications and voices began.  She is found to have hyponatremia, hypokalemia, hyperglycemia and anemia with MCV< 69.0 Past Psychiatric History:  She says she has been going to [facility] to get her psychiatric meds.  She has taken an overdose a long time ago.    Interval Hx:  Feels better today. NO SI now. Thinks she is ready for transfer to [facility]. K still low.  Past Medical History:     Past Medical History  Diagnosis Date  . Hypertension   . Bipolar affective     Chemical Imbalance       Past Surgical History  Procedure Date  . Abdominal hysterectomy     Allergies: No Known Allergies  Current Medications:  Prior to Admission medications   Medication Sig Start Date End Date Taking? Authorizing Provider  haloperidol (HALDOL) 5 MG tablet Take 10 mg by mouth daily.   Yes Historical Provider, MD  lamoTRIgine (LAMICTAL) 150 MG tablet Take 150 mg by mouth daily.   Yes Historical Provider, MD  lisinopril-hydrochlorothiazide (PRINZIDE,ZESTORETIC) 10-12.5 MG per tablet Take 1 tablet by mouth daily.   Yes Historical Provider, MD  metoprolol succinate (TOPROL-XL) 100 MG 24 hr tablet Take 100 mg by mouth daily. Take with or immediately following a meal.   Yes Historical Provider, MD  nicotine (NICODERM CQ - DOSED IN MG/24 HOURS) 21 mg/24hr patch Place 1 patch onto the skin daily.   Yes Historical Provider, MD    Social History:    reports that she has been smoking Cigarettes.  She has a 30 pack-year smoking history. She does not have any smokeless tobacco history on file. She reports that she does not drink alcohol or use illicit drugs.   Family History:    History reviewed. No pertinent family  history.  Mental Status Examination/Evaluation: Objective:  Appearance: Casual  Psychomotor Activity:  Normal  Eye Contact::  Good  Speech:  Clear and Coherent  Volume:  Normal  Mood:  ok  Affect:  Congruent and Depressed  Thought Process:  Coherent, Relevant and Intact  Orientation:  Full  Thought Content:  No avh  Suicidal Thoughts:  No SI  Homicidal Thoughts:  No  Judgement:  Fair  Insight:  Fair    DIAGNOSIS:   AXIS I   Bipolar Disorder with psychotic features  AXIS II  Deffered  AXIS III See medical notes.  AXIS IV economic problems, housing problems, other psychosocial or environmental problems, problems related to social environment and mother provides supportive assistance, pays bills  AXIS V 51-60 moderate symptoms   Assessment/Plan:     RECOMMENDATION:    1.   Will recommend it pt psy follow after medical clearance per Dr. Ferol Luz            ROS   Physical Exam

## 2012-08-07 NOTE — Progress Notes (Signed)
CSW was contacted by [facility] stating there was a bed available for the Pt.   CSW notified Pt's nurse and Pt signed an voluntary commitment for transfer and admittance to [facility].  CSW faxed a copy to [facility] and nurse has hard copy to transport with the Pt.   Nurse will notify ED officers for transport.  Leron Croak, LCSWA [employer] [phone]

## 2012-08-07 NOTE — BHH Counselor (Addendum)
Left message for weekend SW on call [phone]) to let her know pt is accepted to [facility] by Lloyd Huger to [place]ling 404-2. If pt is IVC, her IVC paperwork needs to be faxed to [facility] before transport and if pt is voluntary a voluntary form needs to be signed by pt before transport is initiated.   Co spoke with Cassandra, SW, and she will work on sending pt to [facility].

## 2012-08-07 NOTE — Progress Notes (Signed)
Psychoeducational Group Note  Date:  08/07/2012 Time: 2000  Group Topic/Focus:  Wrap-Up Group:   The focus of this group is to help patients review their daily goal of treatment and discuss progress on daily workbooks.  Participation Level:  Active  Participation Quality:  Appropriate  Affect:  Appropriate  Cognitive:  Appropriate  Insight:  Good  Engagement in Group:  Good  Additional Comments:  Patient attended and participated in group tonight. She reported that he day went well. She was in the hospital earlier,and she meet some new people. The patient advised that she cope by staying in prayer, by staying positive and taking one day at a time.  Lita Mains [facility] 08/07/2012, 10:48 PM

## 2012-08-08 MED ORDER — POTASSIUM CHLORIDE CRYS ER 10 MEQ PO TBCR
10.0000 meq | EXTENDED_RELEASE_TABLET | Freq: Every day | ORAL | Status: DC
  Administered 2012-08-08 – 2012-08-09 (×2): 10 meq via ORAL
  Filled 2012-08-08 (×4): qty 1

## 2012-08-08 NOTE — H&P (Signed)
  Pt was seen by me today. Will continue current meds.  The detailed H&P note will be done by mid level (NP/PA).  

## 2012-08-08 NOTE — Progress Notes (Signed)
Psychoeducational Group Note  Date:  08/08/2012 Time:  2000  Group Topic/Focus:  Goals Group:   The focus of this group is to help patients establish daily goals to achieve during treatment and discuss how the patient can incorporate goal setting into their daily lives to aide in recovery.  Participation Level:  Active  Participation Quality:  Appropriate  Affect:  Appropriate  Cognitive:  Appropriate  Insight:  Good  Engagement in Group:  Good  Additional Comments:  Patient stated that she had a relaxing day, but feels that she is "on edge" because she is unable to get the things that she needs while admitted such as a radio. Patient states that the radio helps her to relax.   Lyndee Hensen 08/08/2012, 8:55 PM

## 2012-08-08 NOTE — Progress Notes (Signed)
Patient ID: Jackie Odonnell, female   DOB: 1961-03-30, 51 y.o.   MRN: [id number] D. The patient has a flat affect and depressed mood. The patient attended evening wrap up group and expressed that music helps her to relax. Still is hearing voices telling her to hurt herself, but states they are not as loud and she is able to ignore them.  A. Q 15 minute checks maintained for safety. Met with patient 1:1. Encouraged to attend evening group. Reviewed medications. R. The patient feels the medication is helping her. Voices are less intrusive. Denies any suicidal ideation and is able to contract for safety.

## 2012-08-08 NOTE — BHH Suicide Risk Assessment (Signed)
Suicide Risk Assessment  Admission Assessment     Demographic factors:  Assessment Details Time of Assessment: Admission Information Obtained From: Patient Current Mental Status:  See below Loss Factors:  Loss Factors: Financial problems / change in socioeconomic status Historical Factors:  Historical Factors: Family history of mental illness or substance abuse Risk Reduction Factors:  Risk Reduction Factors: Sense of responsibility to family;Religious beliefs about death;Positive social support  CLINICAL FACTORS:   Depression:   Hopelessness  COGNITIVE FEATURES THAT CONTRIBUTE TO RISK:  Loss of executive function    SUICIDE RISK:   Mild:  Suicidal ideation of limited frequency, intensity, duration, and specificity.  There are no identifiable plans, no associated intent, mild dysphoria and related symptoms, good self-control (both objective and subjective assessment), few other risk factors, and identifiable protective factors, including available and accessible social support.  PLAN OF CARE: Mental Status Examination/Evaluation:  Objective: Appearance: Casual   Psychomotor Activity: Normal   Eye Contact:: Good   Speech: Clear and Coherent   Volume: Normal   Mood: ok   Affect: Congruent and Depressed   Thought Process: Coherent, Relevant and Intact   Orientation: Full   Thought Content: No avh   Suicidal Thoughts: No SI . Had when she got admitted  Homicidal Thoughts: No   Judgement: Fair   Insight: Fair   DIAGNOSIS:  AXIS I  Bipolar Disorder with psychotic features   AXIS II  Deffered   AXIS III  See medical notes.   AXIS IV  economic problems, housing problems, other psychosocial or environmental problems, problems related to social environment and mother provides supportive assistance, pays bills   AXIS V  51-60 moderate symptoms     RECOMMENDATION:   1. Continue current meds  Wonda Cerise 08/08/2012, 3:35 PM

## 2012-08-08 NOTE — H&P (Signed)
Psychiatric Admission Assessment Adult  Patient Identification:  Jackie Odonnell Date of Evaluation:  08/08/2012 Chief Complaint:  schizophrenia,paranoid History of Present Illness: This very pleasant woman presented to the [facility] for medical clearance prior to being accepted to [facility] and was found to be severely hyponatremic and hypokalemic.  She was admitted to IM for correction.  Once she was stabilized she was transferred to [facility] for further care and treatment.  She has a history of schizophrenia, paranoid type and has been off of her medication for 2 days.  Past Psychiatric History: Diagnosis:  Schizophrenia, paranoid type  Hospitalizations:  No previous admissions  Outpatient Care:  [facility]  Substance Abuse Care: na  Self-Mutilation:  Suicidal Attempts: NONE  Violent Behaviors:   Past Medical History:   Past Medical History  Diagnosis Date  . Hypertension   . Bipolar affective     Chemical Imbalance    Allergies:  No Known Allergies PTA Medications: Prescriptions prior to admission  Medication Sig Dispense Refill  . haloperidol (HALDOL) 5 MG tablet Take 10 mg by mouth daily.      Marland Kitchen lamoTRIgine (LAMICTAL) 150 MG tablet Take 150 mg by mouth daily.      . nicotine (NICODERM CQ - DOSED IN MG/24 HOURS) 21 mg/24hr patch Place 1 patch onto the skin daily.      . potassium chloride SA (K-DUR,KLOR-CON) 20 MEQ tablet Take 1 tablet (20 mEq total) by mouth daily.      . Ipratropium-Albuterol (COMBIVENT) 20-100 MCG/ACT AERS respimat Inhale 1 puff into the lungs every 6 (six) hours as needed for wheezing or shortness of breath (refractory cough).        Previous Psychotropic Medications:  Medication/Dose    Haldol   Lamictal               Substance Abuse History in the last 12 months:  N/A Substance Age of 1st Use Last Use Amount Specific Type  Nicotine      Alcohol      Cannabis      Opiates      Cocaine      Methamphetamines      LSD      Ecstasy      Benzodiazepines       Caffeine      Inhalants      Others:                         Consequences of Substance Abuse:  Social History: Current Place of Residence: [place] of Birth:   Family Members: Marital Status:   Children:  Sons:  Daughters: Relationships: Education:  Some college  Educational Problems/Performance: Religious Beliefs/Practices: History of Abuse (Emotional/Phsycial/Sexual) Occupational Experiences; Military History:   Legal History: Hobbies/Interests:  Family History:  History reviewed. No pertinent family history. ROS: Negative with the exception of the HPI> PE: Completed in the ED.   Mental Status Examination/Evaluation:  Appearance: casual  Eye Contact::  good  Speech:   clear  Volume:  normal  Mood:   euthymic  Affect:   congruent  Thought Process:  linear  Orientation:  full  Thought Content:  Auditory hallucinations    Suicidal Thoughts:  none  Homicidal Thoughts:  none  Memory:   good  Judgement:  intack  Insight:  present  Psychomotor Activity:  normal    Concentration:  fair  Recall:  good  Akathisia:  none  Handed:  right  AIMS (if indicated):  Assets:    Sleep:  Number of Hours: 6.5     Laboratory/X-Ray Psychological Evaluation(s)      Assessment:    AXIS I:  Schizophrenia, paranoid type AXIS II:  Deferred AXIS III:   Past Medical History  Diagnosis Date  . Hypertension   .  hypokalemia        hyponatremia   AXIS IV:  AXIS V:  51-60 moderate symptoms  Treatment Plan/Recommendations: 1. Admit for crisis management and stabilization. 2. Medication management to reduce current symptoms to base line and improve the     patient's overall level of functioning 3. Treat health problems as indicated. 4. Develop treatment plan to decrease risk of relapse upon discharge and the need for     readmission. 5. Psycho-social education regarding relapse prevention and self care. 6. Health care follow up as needed for medical  problems. 7. Restart home medications where appropriate.  Treatment Plan Summary:  Current Medications:  Current Facility-Administered Medications  Medication Dose Route Frequency Provider Last Rate Last Dose  . acetaminophen (TYLENOL) tablet 650 mg  650 mg Oral Q6H PRN Verne Spurr, PA-C      . alum & mag hydroxide-simeth (MAALOX/MYLANTA) 200-200-20 MG/5ML suspension 30 mL  30 mL Oral Q4H PRN Verne Spurr, PA-C      . haloperidol (HALDOL) tablet 10 mg  10 mg Oral QHS Verne Spurr, PA-C      . lisinopril (PRINIVIL,ZESTRIL) tablet 10 mg  10 mg Oral Daily Curlene Labrum Readling, MD   10 mg at 08/08/12 0757   And  . hydrochlorothiazide (MICROZIDE) capsule 12.5 mg  12.5 mg Oral Daily Curlene Labrum Readling, MD   12.5 mg at 08/08/12 0757  . lamoTRIgine (LAMICTAL) tablet 150 mg  150 mg Oral QHS Verne Spurr, PA-C      . magnesium hydroxide (MILK OF MAGNESIA) suspension 30 mL  30 mL Oral Daily PRN Verne Spurr, PA-C      . metoprolol succinate (TOPROL-XL) 24 hr tablet 100 mg  100 mg Oral Daily Verne Spurr, PA-C   100 mg at 08/08/12 0757  . nicotine (NICODERM CQ - dosed in mg/24 hours) patch 21 mg  21 mg Transdermal Q0600 Verne Spurr, PA-C   21 mg at 08/08/12 1610  . potassium chloride (K-DUR,KLOR-CON) CR tablet 10 mEq  10 mEq Oral Daily Curlene Labrum Readling, MD   10 mEq at 08/08/12 0757  . traZODone (DESYREL) tablet 50 mg  50 mg Oral QHS PRN,MR X 1 Verne Spurr, PA-C   50 mg at 08/07/12 2227  . DISCONTD: haloperidol (HALDOL) tablet 10 mg  10 mg Oral Q0600 Verne Spurr, PA-C      . DISCONTD: lisinopril-hydrochlorothiazide (PRINZIDE,ZESTORETIC) 10-12.5 MG per tablet 1 tablet  1 tablet Oral Daily Verne Spurr, PA-C      . DISCONTD: potassium chloride (KLOR-CON) packet 10 mEq  10 mEq Oral Daily Verne Spurr, PA-C       Facility-Administered Medications Ordered in Other Encounters  Medication Dose Route Frequency Provider Last Rate Last Dose  . DISCONTD: 0.9 %  sodium chloride infusion   Intravenous  Continuous Vassie Loll, MD 10 mL/hr at 08/06/12 1400    . DISCONTD: amLODipine (NORVASC) tablet 5 mg  5 mg Oral Daily Vassie Loll, MD      . DISCONTD: enoxaparin (LOVENOX) injection 40 mg  40 mg Subcutaneous Q24H Dorothea Ogle, MD   40 mg at 08/07/12 1006  . DISCONTD: guaifenesin (ROBITUSSIN) 100 MG/5ML syrup 200 mg  200 mg Oral  Q4H PRN Vassie Loll, MD   200 mg at 08/07/12 0540  . DISCONTD: haloperidol (HALDOL) tablet 10 mg  10 mg Oral Daily Sunnie Nielsen, MD   10 mg at 08/07/12 1005  . DISCONTD: HYDROcodone-acetaminophen (NORCO/VICODIN) 5-325 MG per tablet 1-2 tablet  1-2 tablet Oral Q4H PRN Dorothea Ogle, MD      . DISCONTD: Ipratropium-Albuterol (COMBIVENT) respimat 1 puff  1 puff Inhalation Q6H PRN Annia Belt, PHARMD      . DISCONTD: lamoTRIgine (LAMICTAL) tablet 150 mg  150 mg Oral Daily Sunnie Nielsen, MD   150 mg at 08/07/12 1005  . DISCONTD: nicotine (NICODERM CQ - dosed in mg/24 hours) patch 21 mg  21 mg Transdermal Q24H Dorothea Ogle, MD   21 mg at 08/07/12 1006  . DISCONTD: ondansetron (ZOFRAN) injection 4 mg  4 mg Intravenous Q6H PRN Dorothea Ogle, MD      . DISCONTD: ondansetron [facility]) tablet 4 mg  4 mg Oral Q6H PRN Dorothea Ogle, MD      . DISCONTD: potassium chloride SA (K-DUR,KLOR-CON) CR tablet 40 mEq  40 mEq Oral Q4H Vassie Loll, MD   40 mEq at 08/07/12 1301  . DISCONTD: sodium chloride 0.9 % bolus 1,000 mL  1,000 mL Intravenous Once Sunnie Nielsen, MD      . DISCONTD: sodium chloride 0.9 % injection 3 mL  3 mL Intravenous Q12H Dorothea Ogle, MD   3 mL at 08/07/12 1000    Observation Level/Precautions:    Laboratory:    Psychotherapy:    Medications:    Routine PRN Medications:  Yes  Consultations:    Discharge Concerns:    Other:      Lloyd Huger T. Matilyn Fehrman PAC For Dr. Harvie Heck D. Readling 8/18/201312:57 PM

## 2012-08-08 NOTE — Progress Notes (Signed)
Patient ID: Jackie Odonnell, female   DOB: 09/02/1961, 51 y.o.   MRN: [id number] 08-18- 13 nursing shift note: D: pt had bobbie pins in her hair and they are not allowed on the unit. A: bobby pins were removed by the mht and she groomed the pt's hair. R: she was very pleased and happy. The bobbie pins were removed from the unit. Will monitor and q 15 min cks continue. Pt remains safe. Pt has voiced no complaints or needs.

## 2012-08-08 NOTE — Progress Notes (Signed)
Patient ID: Jackie Odonnell, female   DOB: 1960/12/26, 51 y.o.   MRN: [id number] D. The patient is pleasant and adjusting to the unit. Interacted appropriately in the milieu. Attended the  evening wrap up group. Has been having auditory hallucinations telling her to hurt herself. A. Q 15 minute checks maintained for safety. Met with patient 1:1. Medications and treatment discussed. R. Requested to have her daily medications changed to HS, as this is how she takes them at home. Reports that her medications help her to sleep at night. P.A. was informed of patient request and medications were changed to HS

## 2012-08-08 NOTE — Progress Notes (Signed)
Psychoeducational Group Note  Date:  08/08/2012 Time:  1000  Group Topic/Focus:  Spirituality:   The focus of this group is to discuss how one's spirituality can aide in recovery.  Participation Level:  Active  Participation Quality:  Appropriate, Attentive, Sharing and Supportive  Affect:  Appropriate  Cognitive:  Alert, Appropriate and Oriented  Insight:  Good  Engagement in Group:  Good  Additional Comments:  Patient was attentive and appropriate in group. The patient was able to describe why her spirituality was important to her and how she used her spirituality as a support system within her life. The patient was also eager to share with other patients and encouraged other patients to participate in group.  Para March, Marlynn Hinckley MCCOLLUM 08/08/2012, 11:12 AM

## 2012-08-08 NOTE — [facility] Counselor (Signed)
Adult Comprehensive Assessment  Patient ID: Jackie Odonnell, female   DOB: December 28, 1960, 51 y.o.   MRN: [id number]  Information Source: Information source: Patient  Current Stressors:  Educational / Learning stressors: Pt. has [employer] and 3 years of college Employment / Job issues: Unemployed-Pt. is appliying for disability Family Relationships: Pt.'s mother teats the pt. like a child Financial / Lack of resources (include bankruptcy): Pt.'s mother and sister help the pt. withher bills Housing / Lack of housing: Pt. lives in apartment alone but receives finicial help from her sister Physical health (include injuries & life threatening diseases): Schizopheria-diagnosied at 51 years old Social relationships:  (Pt. states she tends to isolate her self) Substance abuse: Pt. denies use Bereavement / Loss: Pt. father passed away 8  years ago.  Living/Environment/Situation:  Living Arrangements: Alone Living conditions (as described by patient or guardian): Pt. likes where she lives How long has patient lived in current situation?: 1 year What is atmosphere in current home: Temporary  Family History:  Marital status: Single Does patient have children?: No  Childhood History:  By whom was/is the patient raised?: Both parents Additional childhood history information: Pt. states she had a good childhood growing up Description of patient's relationship with caregiver when they were a child: Pt. was closer to dad when she was a child Patient's description of current relationship with people who raised him/her: Pt. is close to mother know and father is deseased Does patient have siblings?: Yes Number of Siblings: 2  (1 sister and 1 brother) Description of patient's current relationship with siblings: Pt. is close to sister but was not raised with her brother Did patient suffer any verbal/emotional/physical/sexual abuse as a child?: No Did patient suffer from severe childhood neglect?:  No Has patient ever been sexually abused/assaulted/raped as an adolescent or adult?: No Was the patient ever a victim of a crime or a disaster?: No Witnessed domestic violence?: No Has patient been effected by domestic violence as an adult?: No  Education:  Highest grade of school patient has completed: Geneticist, molecular and 3 years of college Currently a student?: No Learning disability?: No  Employment/Work Situation:   Employment situation: Unemployed Patient's job has been impacted by current illness: No What is the longest time patient has a held a job?: 10 years Where was the patient employed at that time?: [employer] Has patient ever been in the [employer]?: No Has patient ever served in Buyer, retail?: No  Financial Resources:   Surveyor, quantity resources: Sales executive;Medicaid;Support from parents / caregiver Does patient have a representative payee or guardian?: No  Alcohol/Substance Abuse:   What has been your use of drugs/alcohol within the last 12 months?: Pt. denies use If attempted suicide, did drugs/alcohol play a role in this?: No Alcohol/Substance Abuse Treatment Hx: Denies past history If yes, describe treatment: Pt. deneis past history Has alcohol/substance abuse ever caused legal problems?: No  Social Support System:   Patient's Community Support System: Good Describe Community Support System: Pt. reports good support Type of faith/religion: Christian-[employer]ist How does patient's faith help to cope with current illness?: Through Prayer/ Pt. listens to [employer]  Leisure/Recreation:   Leisure and Hobbies: Pt. states she does not have any  Strengths/Needs:   What things does the patient do well?: Talking to people In what areas does patient struggle / problems for patient: Opening up and expressing self. Pt. is also stuggling with finaces  Discharge Plan:   Does patient have access to transportation?:  Yes (Pt.'s sister will pick her up at  d/c) Will patient be returning to same living situation after discharge?: Yes (Pt. will return to apartment but unsure of status) Currently receiving community mental health services: Yes (From Whom) (Pt. is seen by [facility]) If no, would patient like referral for services when discharged?: Yes (What county?) ([place] Referral back to [facility]) Does patient have financial barriers related to discharge medications?: Yes Patient description of barriers related to discharge medications: Pt.'s mother and sister pay for pt.'s medication and pt. states that it is a finicial burden to her family  Summary/Recommendations:   Summary and Recommendations (to be completed by the evaluator): Pt. is a 51 year old female admitted for Schizophernia. Pt. reported  hearing voices in ED before coming to [facility]. Pt. is under finacial stess and states that her mother and sister have been helping her out finicially. The pt. is seen by [facility] and would like to be referred back thee after d/c. Pt. lives in [place]. Pt. recommendations include: Crisis Stablization, Case  Management, Group Therphy,  and Medication management.   Jeison Delpilar Coffeen. 08/08/2012

## 2012-08-08 NOTE — Progress Notes (Signed)
Patient ID: Jackie Odonnell, female   DOB: December 24, 1960, 51 y.o.   MRN: [id number]   [facility] Group Notes:  (Counselor/Nursing/MHT/Case Management/Adjunct)  08/08/2012 11 AM  Type of Therapy:  Aftercare Planning, Group Therapy, Dance/Movement Therapy   Participation Level:  Active  Participation Quality:  Appropriate  Affect:  Appropriate  Cognitive:  Appropriate  Insight:  Good  Engagement in Group:  Good  Engagement in Therapy:  Good  Modes of Intervention:  Clarification, Problem-solving, Role-play, Socialization and Support  Summary of Progress/Problems: After Care: Pt. attended and participated in aftercare planning group. Pt. accepted information on suicide prevention, warning signs to look for with suicide and crisis line numbers to use. The pt. agreed to call crisis line numbers if having warning signs or having thoughts of suicide. Pt. shared that she was feeling relaxed.  Counseling:  Therapist and group members discussed healthy support systems, different types of support, and how to ask for help. Therapists modeled healthy and unhealthy supports and group members discussed the differences. Pt. was very open and sharing during group.   Cassidi Long 08/08/2012. 11:50 AM

## 2012-08-09 DIAGNOSIS — F200 Paranoid schizophrenia: Principal | ICD-10-CM

## 2012-08-09 MED ORDER — POTASSIUM CHLORIDE CRYS ER 20 MEQ PO TBCR
20.0000 meq | EXTENDED_RELEASE_TABLET | Freq: Every day | ORAL | Status: DC
  Administered 2012-08-10 – 2012-08-16 (×7): 20 meq via ORAL
  Filled 2012-08-09: qty 1
  Filled 2012-08-09: qty 14
  Filled 2012-08-09 (×7): qty 1

## 2012-08-09 NOTE — Discharge Planning (Signed)
08/09/2012  SW met with Jackie Odonnell in discharge planning group.  SW found Jackie Odonnell to need contacts made on their behalf.  SW found Jackie Odonnell has current service providers, and they are [place].  Pt. Stated she has an appt. With [facility] to see therapist on 08-18-12.  SW found Jackie Odonnell to rate her depression at 5/10 and anxiety at 7/10 today.  Pt stated she is hearing voices a little, but is unable to make them out.  Pt. Stated she has been in touch with a lawyer about short term disability.  SW will continue to assess for referrals.   Clarice Pole, LCASA 08/09/2012, 11:20 AM

## 2012-08-09 NOTE — Progress Notes (Signed)
[facility] Group Notes:  (Counselor/Nursing/MHT/Case Management/Adjunct)  08/09/2012 3:52 PM  Type of Therapy:  Group Therapy  Participation Level:  Active  Participation Quality:  Appropriate  Affect:  Depressed  Cognitive:  Oriented  Insight:  Good  Engagement in Group:  Good  Engagement in Therapy:  Good  Modes of Intervention:  Education, Problem-solving and Support  Summary of Progress/Problems: Patient talked about her depression. She stated that she is not able to work anymore and is waiting for disability. Meanwhile, she is having to depend on family and can't afford where she is living currently. This makes her worry about her family and makes her more depressed. She stated that she doesn't want to keep asking them for things but they are all she has. She talked about them accusing her of begging and spending too much on cigarettes, but she stated she smokes to deal with things She was receptive to hearing other ways of dealing with her depression. She wants to get busier but has the disadvantage of not being on the bus route and can't afford to get her car fixed. She talked about several activities she could do at home and plans on getting back with the church.   Tylar Merendino, Aram Beecham 08/09/2012, 3:52 PM

## 2012-08-09 NOTE — Progress Notes (Signed)
Currently resting quietly in bed with eyes closed. Respirations are even and unlabored. No acute distress noted. Safety has been maintained with Q15 min observation. Will continue Q15 min observation and continue current POC. 

## 2012-08-09 NOTE — Progress Notes (Signed)
[facility] MD Progress Note  08/09/2012 3:54 PM  Diagnosis:  Axis I:  Schizophrenia - Paranoid Type.  The patient was seen today and reports the following:   ADL's: Intact.  Sleep: The patient reports to having difficulty initiating and maintaining sleep last night. Appetite: The patient reports a good appetite.   Mild>(1-10) >Severe  Hopelessness (1-10): 5  Depression (1-10): 3  Anxiety (1-10): 5   Suicidal Ideation: The patient denies any suicidal ideations today.  Plan: No  Intent: No  Means: No   Homicidal Ideation: The patient denies any homicidal ideations today.  Plan: No  Intent: No.  Means: No   General Appearance/Behavior: The patient was friendly and cooperative today with this provider.  Eye Contact: Good.  Speech: Appropriate in rate and volume with no pressuring noted today.  Motor Behavior: wnl.  Level of Consciousness: Alert and Oriented x 3.  Mental Status: Alert and Oriented x 3.  Mood: Mildly depressed.  Affect: Mildly constricted.  Anxiety Level: Moderate anxiety reported today.  Thought Process: wnl.  Thought Content: The patient denies any visual hallucinations today and denies any delusional thinking.  She does report ongoing auditory hallucinations which are much improved.  Perception:. wnl.  Judgment: Fair to Good.  Insight: Fair to Good.  Cognition: Oriented to person, place and time.  Sleep:  Number of Hours: 6.75    Vital Signs:Blood pressure 114/78, pulse 98, temperature 98.4 F (36.9 C), temperature source Oral, resp. rate 16, height 5' 5.75" (1.67 m), weight 80.287 kg (177 lb), SpO2 100.00%.  Current Medications: Current Facility-Administered Medications  Medication Dose Route Frequency Provider Last Rate Last Dose  . acetaminophen (TYLENOL) tablet 650 mg  650 mg Oral Q6H PRN Verne Spurr, PA-C   650 mg at 08/08/12 2117  . alum & mag hydroxide-simeth (MAALOX/MYLANTA) 200-200-20 MG/5ML suspension 30 mL  30 mL Oral Q4H PRN Verne Spurr, PA-C       . haloperidol (HALDOL) tablet 10 mg  10 mg Oral QHS Curlene Labrum Aerial Dilley, MD   10 mg at 08/08/12 2116  . lisinopril (PRINIVIL,ZESTRIL) tablet 10 mg  10 mg Oral Daily Curlene Labrum Heston Widener, MD   10 mg at 08/09/12 0801   And  . hydrochlorothiazide (MICROZIDE) capsule 12.5 mg  12.5 mg Oral Daily Curlene Labrum Ever Gustafson, MD   12.5 mg at 08/09/12 0801  . lamoTRIgine (LAMICTAL) tablet 150 mg  150 mg Oral QHS Curlene Labrum Aunesti Pellegrino, MD   150 mg at 08/08/12 2116  . magnesium hydroxide (MILK OF MAGNESIA) suspension 30 mL  30 mL Oral Daily PRN Verne Spurr, PA-C      . metoprolol succinate (TOPROL-XL) 24 hr tablet 100 mg  100 mg Oral Daily Curlene Labrum Olene Godfrey, MD   100 mg at 08/09/12 0800  . nicotine (NICODERM CQ - dosed in mg/24 hours) patch 21 mg  21 mg Transdermal Q0600 Curlene Labrum Keshayla Schrum, MD   21 mg at 08/09/12 0649  . potassium chloride SA (K-DUR,KLOR-CON) CR tablet 20 mEq  20 mEq Oral Daily Curlene Labrum Raeshaun Simson, MD      . traZODone (DESYREL) tablet 50 mg  50 mg Oral QHS PRN,MR X 1 Verne Spurr, PA-C   50 mg at 08/07/12 2227  . DISCONTD: potassium chloride (K-DUR,KLOR-CON) CR tablet 10 mEq  10 mEq Oral Daily Curlene Labrum Kristilyn Coltrane, MD   10 mEq at 08/09/12 0800   Lab Results: No results found for this or any previous visit (from the past 48 hour(s)).  Physical Findings: AIMS: Facial  and Oral Movements Muscles of Facial Expression: None, normal Lips and Perioral Area: None, normal Jaw: None, normal Tongue: None, normal,Extremity Movements Upper (arms, wrists, hands, fingers): None, normal Lower (legs, knees, ankles, toes): None, normal, Trunk Movements Neck, shoulders, hips: None, normal, Overall Severity Severity of abnormal movements (highest score from questions above): None, normal Incapacitation due to abnormal movements: None, normal Patient's awareness of abnormal movements (rate only patient's report): No Awareness, Dental Status Current problems with teeth and/or dentures?: Yes (missing teeth ) Does patient usually  wear dentures?: No   Review of Systems:  Neurological: The patient denies any headaches today. She denies any seizures or dizziness.  G.I.: The patient denies any constipation today or G.I. Upset.  Musculoskeletal: The patient denies any muscle or skeletal difficulties today.   Time was spent today discussing with the patient her current symptoms. The patient reports to having difficulty sleeping last night but reports a good appetite. She reports mild feelings of sadness, anhedonia and depressed mood today as well as moderate anxiety symptoms. She denies any suicidal or homicidal ideations today as well as any visual hallucinations or delusional thinking.  The patient does report auditory hallucinations which are much improved since restarting her antipsychotic medications.  She denies any medication related side effects or other concerns today.  Treatment Plan Summary:  1. Daily contact with patient to assess and evaluate symptoms and progress in treatment.  2. Medication management  3. The patient will deny suicidal ideations or homicidal ideations for 48 hours prior to discharge and have a depression and anxiety rating of 3 or less. The patient will also deny any auditory or visual hallucinations or delusional thinking.  4. The patient will deny any symptoms of substance withdrawal at time of discharge.   Plan:  1. Will continue the medication Haldol 10 mgs po qhs for psychosis.  2. Will continue the medication Lamictal at 150 mgs po qhs for mood stabilization. 3. Will continue the patient on her non-psychiatric medications as listed above. 4. Laboratory studies reviewed.  5. Will increase the patient's KCL to 20 mEq po q am for hypokalemia. 6. Will continue to monitor.    Rama Mcclintock 08/09/2012, 3:54 PM

## 2012-08-09 NOTE — Progress Notes (Signed)
Patient is attending groups and participating and interacting well.  She reports that the voices are not as active today; she cannot decipher what they are are telling her.  They are not commanding that she harm herself.  She reports her depression as a 5; her anxiety level at a 7.  Patient denies any SI/HI today.  Her stressors are financial problems with possible eviction from her apartment.  Her mother and sister have been her providers, emotionally and financially.  Patient has applied for short term disability and has been rejected three times.    Continue to monitor medication management and MD orders.  Collaborate with treatment team with POC.  Safety checks continuted 15 minutes.  Reassure and support patient.  Encourage patient to participate in her treatment and attendance of groups on the unit.  She is pleasant and calm; her behavior is appropriate.

## 2012-08-09 NOTE — Progress Notes (Signed)
08/09/2012         Time: 0930      Group Topic/Focus: The focus of this group is on promoting emotional and psychological well-being through the process of creative expression, relaxation, socialization, fun and enjoyment.  Participation Level: Active  Participation Quality: Attentive  Affect: Anxious  Cognitive: Alert   Additional Comments: Patient reserved, appears anxious, reports her goal in life is to be treated like a star.  Alberto Schoch 08/09/2012 10:09 AM

## 2012-08-09 NOTE — Tx Team (Signed)
Interdisciplinary Treatment Plan Update (Adult) Date: 08/09/2012  Time Reviewed: 10:03 AM  Progress in Treatment: Attending groups: Yes Participating in groups: Yes Taking medication as prescribed: Yes Tolerating medication: Yes Family/Significant othe contact made: still need to observe to assess appropriate contact Patient understands diagnosis: Yes Discussing patient identified problems/goals with staff: Yes Medical problems stabilized or resolved: Yes Denies suicidal/homicidal ideation: Yes Issues/concerns per patient self-inventory: None identified Other: N/A New problem(s) identified: None Identified Reason for Continuation of Hospitalization: Anxiety Depression Medication stabilization Interventions implemented related to continuation of hospitalization: mood stabilization, medication monitoring and adjustment, group therapy and psycho education, safety checks q 15 mins Additional comments: N/A Estimated length of stay:  Discharge Plan: SW is assessing for appropriate referrals.  New goal(s): N/A Review of initial/current patient goals per problem list:  1. Goal(s): Reduce depressive symptoms  Met: No  Target date: by discharge  As evidenced by: Reducing depression from a 10 to a 3 as reported by pt.  2. Goal (s): Eliminate Suicidal Ideation  Met: No  Target date: by discharge  As evidenced by: Eliminate suicidal ideation.  3. Goal(s): Reduce Psychosis  Met: No  Target date: by discharge  As evidenced by: Reduce psychotic symptoms to baseline, as reported by pt.  4. Goal(s):  Address Aftercare plan  Met: No  Target date: by discharge  As evidenced by:  Confirmation of appts by discharge.  Pt would like consistent therapist and Medication Mgmt provider at [facility] if possible. Attendees: Patient: Jackie Odonnell 08/09/2012  Family:    Physician: Franchot Gallo, MD 08/09/2012 10:03 AM   Nursing:    Case Manager: Clarice Pole, LCASA 08/09/2012 10:03 AM    Counselor: Veto Kemps, MT-BC 08/09/2012 10:03 AM   Other: Joslyn Devon, RN 08/09/2012 10:03 AM   Other:    Other:    Other:    Scribe for Treatment Team:  Clarice Pole, LCASA 08/09/2012 10:03 AM

## 2012-08-10 LAB — COMPREHENSIVE METABOLIC PANEL
Albumin: 3.4 g/dL — ABNORMAL LOW (ref 3.5–5.2)
Alkaline Phosphatase: 115 U/L (ref 39–117)
BUN: 10 mg/dL (ref 6–23)
CO2: 30 mEq/L (ref 19–32)
Chloride: 102 mEq/L (ref 96–112)
Creatinine, Ser: 0.85 mg/dL (ref 0.50–1.10)
GFR calc non Af Amer: 79 mL/min — ABNORMAL LOW (ref >90)
Potassium: 3.9 mEq/L (ref 3.5–5.1)
Total Bilirubin: 0.2 mg/dL — ABNORMAL LOW (ref 0.3–1.2)

## 2012-08-10 MED ORDER — GABAPENTIN 100 MG PO CAPS
100.0000 mg | ORAL_CAPSULE | ORAL | Status: DC
  Administered 2012-08-10 – 2012-08-13 (×9): 100 mg via ORAL
  Filled 2012-08-10 (×17): qty 1

## 2012-08-10 NOTE — Progress Notes (Signed)
[facility] Group Notes:  (Counselor/Nursing/MHT/Case Management/Adjunct)  08/10/2012 8:00PM  Type of Therapy:  Group Therapy  Participation Level:  Active  Participation Quality:  Attentive  Affect:  Appropriate  Cognitive:  Alert and Oriented  Insight:  Good  Engagement in Group:  Good  Engagement in Therapy:  Good  Modes of Intervention:  Clarification, Problem-solving and Support  Summary of Progress/Problems: Pt verbalized that she had a good day. Pt stated that she hasn't always been able to express her feelings or to open up but that she was able to learn additional ways on today. Pt verbalized that one way is that she could put her words into actions. Pt verbalized that she was going to continue to attend all of her groups.  Prather Failla, Randal Buba 08/10/2012, 9:04 PM

## 2012-08-10 NOTE — Progress Notes (Signed)
[name] Progress Note  08/10/2012 5:15 PM  Diagnosis:  Axis I: Schizophrenia - Paranoid Type.   The patient was seen today and reports the following:   ADL's: Intact.  Sleep: The patient reports to sleeping well last night.  Appetite: The patient reports a good appetite.   Mild>(1-10) >Severe  Hopelessness (1-10): 5  Depression (1-10): 2  Anxiety (1-10): 8   Suicidal Ideation: The patient denies any suicidal ideations today.  Plan: No  Intent: No  Means: No   Homicidal Ideation: The patient denies any homicidal ideations today.  Plan: No  Intent: No.  Means: No   General Appearance/Behavior: The patient remains friendly and cooperative today with this provider.  Eye Contact: Good.  Speech: Appropriate in rate and volume with no pressuring noted today.  Motor Behavior: wnl.  Level of Consciousness: Alert and Oriented x 3.  Mental Status: Alert and Oriented x 3.  Mood: Mildly depressed.  Affect: Mildly constricted.  Anxiety Level: Moderate to severe anxiety reported today.  Thought Process: wnl.  Thought Content: The patient denies any visual hallucinations today and denies any delusional thinking. She does report ongoing auditory hallucinations which are continuing to improve.  Perception:. wnl.  Judgment: Fair to Good.  Insight: Fair to Good.  Cognition: Oriented to person, place and time.  Sleep:  Number of Hours: 6.75    Vital Signs:Blood pressure 116/79, pulse 88, temperature 98.5 F (36.9 C), temperature source Oral, resp. rate 18, height 5' 5.75" (1.67 m), weight 80.287 kg (177 lb), SpO2 100.00%.  Current Medications: Current Facility-Administered Medications  Medication Dose Route Frequency Provider Last Rate Last Dose  . acetaminophen (TYLENOL) tablet 650 mg  650 mg Oral Q6H PRN Verne Spurr, PA-C   650 mg at 08/08/12 2117  . alum & mag hydroxide-simeth (MAALOX/MYLANTA) 200-200-20 MG/5ML suspension 30 mL  30 mL Oral Q4H PRN Verne Spurr, PA-C      .  gabapentin (NEURONTIN) capsule 100 mg  100 mg Oral [id number] Nakeesha Bowler D Lilburn Straw, MD      . haloperidol (HALDOL) tablet 10 mg  10 mg Oral QHS Curlene Labrum Jaevon Paras, MD   10 mg at 08/09/12 2135  . lisinopril (PRINIVIL,ZESTRIL) tablet 10 mg  10 mg Oral Daily Curlene Labrum Rosabelle Jupin, MD   10 mg at 08/10/12 0739   And  . hydrochlorothiazide (MICROZIDE) capsule 12.5 mg  12.5 mg Oral Daily Curlene Labrum Wafaa Deemer, MD   12.5 mg at 08/10/12 0739  . lamoTRIgine (LAMICTAL) tablet 150 mg  150 mg Oral QHS Curlene Labrum Melesa Lecy, MD   150 mg at 08/09/12 2135  . magnesium hydroxide (MILK OF MAGNESIA) suspension 30 mL  30 mL Oral Daily PRN Verne Spurr, PA-C      . metoprolol succinate (TOPROL-XL) 24 hr tablet 100 mg  100 mg Oral Daily Curlene Labrum Pricella Gaugh, MD   100 mg at 08/10/12 0739  . nicotine (NICODERM CQ - dosed in mg/24 hours) patch 21 mg  21 mg Transdermal Q0600 Curlene Labrum Aaren Atallah, MD   21 mg at 08/10/12 0647  . potassium chloride SA (K-DUR,KLOR-CON) CR tablet 20 mEq  20 mEq Oral Daily Curlene Labrum Jailin Manocchio, MD   20 mEq at 08/10/12 0739  . traZODone (DESYREL) tablet 50 mg  50 mg Oral QHS PRN,MR X 1 Verne Spurr, PA-C   50 mg at 08/07/12 2227   Lab Results: No results found for this or any previous visit (from the past 48 hour(s)).  Physical Findings: AIMS: Facial and Oral Movements Muscles  of Facial Expression: None, normal Lips and Perioral Area: None, normal Jaw: None, normal Tongue: None, normal,Extremity Movements Upper (arms, wrists, hands, fingers): None, normal Lower (legs, knees, ankles, toes): None, normal, Trunk Movements Neck, shoulders, hips: None, normal, Overall Severity Severity of abnormal movements (highest score from questions above): None, normal Incapacitation due to abnormal movements: None, normal Patient's awareness of abnormal movements (rate only patient's report): No Awareness, Dental Status Current problems with teeth and/or dentures?: Yes (missing teeth ) Does patient usually wear dentures?: No    Review of Systems:  Neurological: The patient denies any headaches today. She denies any seizures or dizziness.  G.I.: The patient denies any constipation today or G.I. Upset.  Musculoskeletal: The patient denies any muscle or skeletal difficulties today.   Time was spent today discussing with the patient her current symptoms. The patient reports to sleeping well last night and reports a good appetite.  She reports mild feelings of sadness, anhedonia and depressed mood today as well as moderate to severe anxiety symptoms.  She denies any suicidal or homicidal ideations today as well as any visual hallucinations or delusional thinking. The patient does report auditory hallucinations which are improving since restarting her antipsychotic medications. She denies any medication related side effects or other concerns today.   Treatment Plan Summary:  1. Daily contact with patient to assess and evaluate symptoms and progress in treatment.  2. Medication management  3. The patient will deny suicidal ideations or homicidal ideations for 48 hours prior to discharge and have a depression and anxiety rating of 3 or less. The patient will also deny any auditory or visual hallucinations or delusional thinking.  4. The patient will deny any symptoms of substance withdrawal at time of discharge.   Plan:  1. Will continue the medication Haldol 10 mgs po qhs for psychosis.  2. Will continue the medication Lamictal at 150 mgs po qhs for mood stabilization.  3. Will start the patient on the medication Neurontin at 100 mgs po q am, 2 pm and hs for anxiety. 4. Will continue the patient on her non-psychiatric medications as listed above.  5. Laboratory studies reviewed.  6. Will order a repeat CMP for this evening.  7. Will continue to monitor.    Jackie Odonnell 08/10/2012, 5:15 PM

## 2012-08-10 NOTE — Discharge Planning (Signed)
08/10/2012  SW met with Jackie Odonnell in discharge planning group.  SW found Jackie Odonnell to need contacts made on their behalf to schedule follow-up with a new provider because she is unhappy with services provided at [facility].  SW scheduled the pt for services for clinical intake and medication management with [facility].  SW found Jackie Odonnell to rate her depression low and anxiety at 8/10 today.  SW will continue to assess for referrals.  Clarice Pole, LCASA 08/10/2012, 2:55 PM

## 2012-08-10 NOTE — Progress Notes (Signed)
Psychoeducational Group Note  Date:  08/10/2012 Time:  2000  Group Topic/Focus:  Wrap-Up Group:   The focus of this group is to help patients review their daily goal of treatment and discuss progress on daily workbooks.  Participation Level:  Active  Participation Quality:  Appropriate  Affect:  Appropriate  Cognitive:  Appropriate  Insight:  Good  Engagement in Group:  Good  Additional Comments:  Patient attended and participated in group tonight. She stated that the day started off rocky, but got better. She went to the gym, work on a puzzle, and attended groups. She advised that she take care of her wellness by staying in the word, reading the bible and listen to ministries on television.  Lita Mains [facility] 08/10/2012, 12:42 AM

## 2012-08-10 NOTE — Progress Notes (Signed)
Psychoeducational Group Note  Date:  08/10/2012 Time:  1100  Group Topic/Focus:  Recovery Goals:   The focus of this group is to identify appropriate goals for recovery and establish a plan to achieve them.  Participation Level:  Active  Participation Quality:  Appropriate  Affect:  Appropriate  Cognitive:  Appropriate  Insight:  Good  Engagement in Group:  Good  Additional Comments:  Pt was able to attend group this morning and shared appropriately.  Alif Petrak E 08/10/2012, 2:45 PM

## 2012-08-10 NOTE — Progress Notes (Signed)
Patient attended discharge planning groups.  She is anxious about discharge and her housing situation.  Patient is not sure how long she will be able to stay in her apartment.  She would like to apply for [employer] 8 housing, but knows that there is a wait list.  CW advised her that she will call and see if there is an opening at this time.  Patient's financial situation is one of her stressors.  She reports that she is still having AH.  She denies any SI/HI at this time.  She rates her depression as 3; her anxiety as an 8.    Continue to monitor medication management and MD orders.  Collaborate with treatment team members regarding patient's POC.  Safety checks completed every 15 minutes.  Patient is calm and cooperative; she is pleasant; her behavior is appropriate.  Encourage patient to attend groups and participate in her treatment.

## 2012-08-10 NOTE — Progress Notes (Signed)
D: Patient in room working on a word search on approach.  Patient states she had a good day today.  When asked to elaborates patient states, "I slept well last night and I was able to watch something on television that I enjoyed and I was able to express my feelings in the groups.  Patient also states she is learning how to put her words into action.  Patient denies SI/HI and denies AVH tonight.  A: Staff to monitor Q 15 mins for safety. Support and encouragement given.  Scheduled medications administered per orders.  R: Patient remains safe on the unit. Patient attended group tonight. Patient taking administered medications.

## 2012-08-11 NOTE — Progress Notes (Signed)
[facility] MD Progress Note  08/11/2012 10:56 AM  S: I have been here since last Sunday. I came into the hospital because I was contemplating suicide. I thought about taking bunch of pills. I was financially stressed and the stress distressed me so much. I am doing better now. I think my medicine seem to be working fine. I am sleeping well. However, I still hear some voices off and on, but the voices are light and not bothering me much. My appetite is good".  Diagnosis:   Axis I: Schizophrenia, paranoid Axis II: Deferred Axis III:  Past Medical History  Diagnosis Date  . Hypertension   . Bipolar affective     Chemical Imbalance   Axis IV: economic problems, occupational problems and other psychosocial or environmental problems Axis V: 21-30 behavior considerably influenced by delusions or hallucinations OR serious impairment in judgment, communication OR inability to function in almost all areas  ADL's:  Intact  Sleep: Good  Appetite:  Good  Suicidal Ideation: "No" Plan:  No Intent:  no Means:  no  Homicidal Ideation:  Plan:  No Intent:  No Means:  No  AEB (as evidenced by): Per patient's reports.  Mental Status Examination/Evaluation: Objective:  Appearance: Casual  Eye Contact::  Good  Speech:  Clear and Coherent  Volume:  Normal  Mood:  Euthymic  Affect:  Appropriate  Thought Process:  Coherent  Orientation:  Full  Thought Content:  Rumination  Suicidal Thoughts:  No  Homicidal Thoughts:  No  Memory:  Immediate;   Good Recent;   Good Remote;   Fair  Judgement:  Fair  Insight:  Fair  Psychomotor Activity:  Normal  Concentration:  Good  Recall:  Good  Akathisia:  No  Handed:  Right  AIMS (if indicated):     Assets:  Desire for Improvement  Sleep:  Number of Hours: 6.75    Vital Signs:Blood pressure 119/79, pulse 82, temperature 98.5 F (36.9 C), temperature source Oral, resp. rate 18, height 5' 5.75" (1.67 m), weight 80.287 kg (177 lb), SpO2 100.00%. Current  Medications: Current Facility-Administered Medications  Medication Dose Route Frequency Provider Last Rate Last Dose  . acetaminophen (TYLENOL) tablet 650 mg  650 mg Oral Q6H PRN Verne Spurr, PA-C   650 mg at 08/08/12 2117  . alum & mag hydroxide-simeth (MAALOX/MYLANTA) 200-200-20 MG/5ML suspension 30 mL  30 mL Oral Q4H PRN Verne Spurr, PA-C      . gabapentin (NEURONTIN) capsule 100 mg  100 mg Oral [id number] Curlene Labrum Sydnie Sigmund, MD   100 mg at 08/11/12 0744  . haloperidol (HALDOL) tablet 10 mg  10 mg Oral QHS Curlene Labrum Jahniah Pallas, MD   10 mg at 08/10/12 2133  . lisinopril (PRINIVIL,ZESTRIL) tablet 10 mg  10 mg Oral Daily Curlene Labrum Jaasia Viglione, MD   10 mg at 08/11/12 0745   And  . hydrochlorothiazide (MICROZIDE) capsule 12.5 mg  12.5 mg Oral Daily Curlene Labrum Allisson Schindel, MD   12.5 mg at 08/11/12 0744  . lamoTRIgine (LAMICTAL) tablet 150 mg  150 mg Oral QHS Curlene Labrum Orphia Mctigue, MD   150 mg at 08/10/12 2133  . magnesium hydroxide (MILK OF MAGNESIA) suspension 30 mL  30 mL Oral Daily PRN Verne Spurr, PA-C      . metoprolol succinate (TOPROL-XL) 24 hr tablet 100 mg  100 mg Oral Daily Curlene Labrum Tahjay Binion, MD   100 mg at 08/11/12 0744  . nicotine (NICODERM CQ - dosed in mg/24 hours) patch 21 mg  21  mg Transdermal Q0600 Curlene Labrum Geraldean Walen, MD   21 mg at 08/11/12 0747  . potassium chloride SA (K-DUR,KLOR-CON) CR tablet 20 mEq  20 mEq Oral Daily Curlene Labrum Jearldean Gutt, MD   20 mEq at 08/11/12 0744  . traZODone (DESYREL) tablet 50 mg  50 mg Oral QHS PRN,MR X 1 Verne Spurr, PA-C   50 mg at 08/07/12 2227    Lab Results:  Results for orders placed during the hospital encounter of 08/07/12 (from the past 48 hour(s))  COMPREHENSIVE METABOLIC PANEL     Status: Abnormal   Collection Time   08/10/12  8:08 PM      Component Value Range Comment   Sodium 139  135 - 145 mEq/L    Potassium 3.9  3.5 - 5.1 mEq/L    Chloride 102  96 - 112 mEq/L    CO2 30  19 - 32 mEq/L    Glucose, Bld 88  70 - 99 mg/dL    BUN 10  6 - 23 mg/dL     Creatinine, Ser 9.60  0.50 - 1.10 mg/dL    Calcium 9.6  8.4 - 45.4 mg/dL    Total Protein 6.2  6.0 - 8.3 g/dL    Albumin 3.4 (*) 3.5 - 5.2 g/dL    AST 23  0 - 37 U/L    ALT 56 (*) 0 - 35 U/L    Alkaline Phosphatase 115  39 - 117 U/L    Total Bilirubin 0.2 (*) 0.3 - 1.2 mg/dL    GFR calc non Af Amer 79 (*) >90 mL/min    GFR calc Af Amer >90  >90 mL/min     Physical Findings: AIMS: Facial and Oral Movements Muscles of Facial Expression: None, normal Lips and Perioral Area: None, normal Jaw: None, normal Tongue: None, normal,Extremity Movements Upper (arms, wrists, hands, fingers): None, normal Lower (legs, knees, ankles, toes): None, normal, Trunk Movements Neck, shoulders, hips: None, normal, Overall Severity Severity of abnormal movements (highest score from questions above): None, normal Incapacitation due to abnormal movements: None, normal Patient's awareness of abnormal movements (rate only patient's report): No Awareness, Dental Status Current problems with teeth and/or dentures?: Yes (missing teeth ) Does patient usually wear dentures?: No  CIWA:    COWS:     Treatment Plan Summary: Daily contact with patient to assess and evaluate symptoms and progress in treatment Medication management  Plan: Continue current treatment plan.  Armandina Stammer I 08/11/2012, 10:56 AM

## 2012-08-11 NOTE — Progress Notes (Signed)
Psychoeducational Group Note  Date:  08/11/2012 Time:  2000  Group Topic/Focus:  Goals Group:   The focus of this group is to help patients establish daily goals to achieve during treatment and discuss how the patient can incorporate goal setting into their daily lives to aide in recovery.  Participation Level:  Active  Participation Quality:  Appropriate and Sharing  Affect:  Appropriate  Cognitive:  Alert and Appropriate  Insight:  Good  Engagement in Group:  Good  Additional Comments:  Patient stated that she had a good day today. Patient shared with the group how much she enjoyed the recreation therapy group that she participated in today because it made her feel relaxed. Patient feels inspired to be more physically active upon discharge.  Lyndee Hensen 08/11/2012, 10:40 PM

## 2012-08-11 NOTE — Progress Notes (Signed)
[facility] Group Notes:  (Counselor/Nursing/MHT/Case Management/Adjunct)  08/11/2012 1:28 PM  Type of Therapy:  Group Therapy 08/10/12  Participation Level:  Active  Participation Quality:  Attentive and Sharing  Affect:  Depressed  Cognitive:  Oriented  Insight:  Good  Engagement in Group:  Good  Engagement in Therapy:  Good  Modes of Intervention:  Education and Support  Summary of Progress/Problems: Patient was active in discussion of Recovery. She talked about her anxiety related to the stress of not being able to work, trying to get disability, and not receiving mental health services she needed. She talked about her motivation to feel better.   Cindee Mclester, Aram Beecham 08/11/2012, 1:28 PM

## 2012-08-11 NOTE — Progress Notes (Signed)
[facility] Group Notes:  (Counselor/Nursing/MHT/Case Management/Adjunct)  08/11/2012 2:30 PM  Type of Therapy:  Psychoeducational Skills  Participation Level:  Active  Participation Quality:  Appropriate  Affect:  Appropriate  Cognitive:  Appropriate  Insight:  Good  Engagement in Group:  Good  Engagement in Therapy:  Good  Modes of Intervention:  Education and Support  Summary of Progress/Problems: Patient was attentive to speaker from mental health association. She related a lot to his symptoms and experiences that he shared. She talked about her own paranoia and fear of the voices. She reported that she had been free of voices for 4 years. Also talked about her current depression and not realizing that she was depressed. She stated that it had gotten so she wasn't doing anything like she used to and was depending on others and getting more anxious. She was particularly interested in the support group for Schizophrenia offered at [facility] and thought she could get a family member to bring her once a week. She would like to do more of the program, but transportation is a problem right now and [place].  [name], Aram Beecham 08/11/2012, 2:30 PM

## 2012-08-11 NOTE — Progress Notes (Signed)
D: Patient in day room interacting with peers on approach.  Patient states she has been more tired during the day.  Patient states she has been trying to fight sleep and she believes her medications she is taking is doing this to her.  Patient denies SI/HI but states she is hearing voices that make her fearful of other people.  Patient states the voice says, "I'm gonna get you, or I'm gonna to kill you." Patient verbally contracts for safety   A: Patient offered encouragement and support and to speak to staff for questions and/or concerns. Scheduled medications administered. Staff to monitor Q 15 mins for safety. R: Patient calm and cooperative and remain safe on the unit. Patient taking administered medications.

## 2012-08-11 NOTE — Progress Notes (Signed)
D: Patient denies SI/HI and auditory and visual hallucinations. The patient has a depressed mood and affect. The patient rates her depression a 6 out of 10 and her hopeless a 5 out of 10 (1 low/10 high). The patient reports sleeping well and states that her appetite is good but that her energy level is low. The patient in interacting on the unit and attending groups. The patient states that she would like to "take better care of 'herself'" when she leaves this facility.  A: Patient given emotional support from RN. Patient encouraged to come to staff with concerns and/or questions. Patient's medication routine continued. Patient's orders and plan of care reviewed.  R: Patient remains appropriate and cooperative. Will continue to monitor patient q15 minutes for safety.

## 2012-08-11 NOTE — Discharge Planning (Signed)
08/11/2012  SW met with Jackie Odonnell in discharge planning group.  SW found Jackie Odonnell to have adequate transportation upon discharge.  SW found Jackie Odonnell to need contacts made on their behalf.  SW found [name] has current service providers, and they are [facility] and at her request she was linked to a new provider.  SW found Jackie Odonnell to rate her depression at 6/10 and anxiety at 5/10 today.  SW agreed to link pt for appt. With PCP at [facility].  SW will continue to assess for referrals.

## 2012-08-11 NOTE — Progress Notes (Signed)
08/11/2012         Time: 0930      Group Topic/Focus: The focus of this group is on discussing various aspects of wellness, balancing those aspects and exploring ways to increase the ability to experience wellness.  Participation Level: Active  Participation Quality: Appropriate and Attentive  Affect: Appropriate  Cognitive: Oriented   Additional Comments: Patient bright, playfully competitive with peers during the activity.  Krysti Hickling 08/11/2012 10:16 AM

## 2012-08-12 MED ORDER — HALOPERIDOL 2 MG PO TABS
2.0000 mg | ORAL_TABLET | Freq: Every day | ORAL | Status: DC
  Administered 2012-08-12 – 2012-08-16 (×5): 2 mg via ORAL
  Filled 2012-08-12: qty 1
  Filled 2012-08-12: qty 2
  Filled 2012-08-12: qty 1
  Filled 2012-08-12: qty 2
  Filled 2012-08-12 (×2): qty 1
  Filled 2012-08-12: qty 14
  Filled 2012-08-12 (×2): qty 1

## 2012-08-12 MED ORDER — BENZTROPINE MESYLATE 0.5 MG PO TABS
0.5000 mg | ORAL_TABLET | ORAL | Status: DC
  Administered 2012-08-12 – 2012-08-16 (×8): 0.5 mg via ORAL
  Filled 2012-08-12 (×3): qty 1
  Filled 2012-08-12: qty 28
  Filled 2012-08-12 (×10): qty 1
  Filled 2012-08-12: qty 28

## 2012-08-12 NOTE — Progress Notes (Signed)
[name] Progress Note  08/12/2012 4:34 PM  Diagnosis:  Axis I: Schizophrenia - Paranoid Type.   The patient was seen today and reports the following:   ADL's: Intact.  Sleep: The patient reports to sleeping well last night.  Appetite: The patient reports a good appetite.   Mild>(1-10) >Severe  Hopelessness (1-10): 3  Depression (1-10): 4  Anxiety (1-10): 4   Suicidal Ideation: The patient denies any suicidal ideations today.  Plan: No  Intent: No  Means: No   Homicidal Ideation: The patient denies any homicidal ideations today.  Plan: No  Intent: No.  Means: No   General Appearance/Behavior: The patient remains friendly and cooperative today with this provider.  Eye Contact: Good.  Speech: Appropriate in rate and volume with no pressuring noted today.  Motor Behavior: wnl.  Level of Consciousness: Alert and Oriented x 3.  Mental Status: Alert and Oriented x 3.  Mood: Mild to moderately depressed.  Affect: Mildly constricted.  Anxiety Level: Mild to moderate anxiety reported today.  Thought Process: wnl.  Thought Content: The patient denies any visual hallucinations today and denies any delusional thinking. She does report ongoing auditory hallucinations which she states "makes me fear for my life."  Perception:. wnl.  Judgment: Good.  Insight: Good.  Cognition: Oriented to person, place and time.  Sleep:  Number of Hours: 6.75    Vital Signs:Blood pressure 143/88, pulse 88, temperature 98.5 F (36.9 C), temperature source Oral, resp. rate 18, height 5' 5.75" (1.67 m), weight 80.287 kg (177 lb), SpO2 100.00%.  Current Medications: Current Facility-Administered Medications  Medication Dose Route Frequency Provider Last Rate Last Dose  . acetaminophen (TYLENOL) tablet 650 mg  650 mg Oral Q6H PRN Verne Spurr, PA-C   650 mg at 08/08/12 2117  . alum & mag hydroxide-simeth (MAALOX/MYLANTA) 200-200-20 MG/5ML suspension 30 mL  30 mL Oral Q4H PRN Verne Spurr, PA-C      .  gabapentin (NEURONTIN) capsule 100 mg  100 mg Oral [id number] Curlene Labrum Mazzy Santarelli, MD   100 mg at 08/12/12 1409  . haloperidol (HALDOL) tablet 10 mg  10 mg Oral QHS Curlene Labrum Landrum Carbonell, MD   10 mg at 08/11/12 2126  . lisinopril (PRINIVIL,ZESTRIL) tablet 10 mg  10 mg Oral Daily Curlene Labrum Nakhia Levitan, MD   10 mg at 08/12/12 0819   And  . hydrochlorothiazide (MICROZIDE) capsule 12.5 mg  12.5 mg Oral Daily Curlene Labrum Jamarcus Laduke, MD   12.5 mg at 08/12/12 0819  . lamoTRIgine (LAMICTAL) tablet 150 mg  150 mg Oral QHS Curlene Labrum Najeh Credit, MD   150 mg at 08/11/12 2126  . magnesium hydroxide (MILK OF MAGNESIA) suspension 30 mL  30 mL Oral Daily PRN Verne Spurr, PA-C      . metoprolol succinate (TOPROL-XL) 24 hr tablet 100 mg  100 mg Oral Daily Curlene Labrum Kao Berkheimer, MD   100 mg at 08/12/12 0819  . nicotine (NICODERM CQ - dosed in mg/24 hours) patch 21 mg  21 mg Transdermal Q0600 Curlene Labrum Tinley Rought, MD   21 mg at 08/12/12 1610  . potassium chloride SA (K-DUR,KLOR-CON) CR tablet 20 mEq  20 mEq Oral Daily Curlene Labrum Zelene Barga, MD   20 mEq at 08/12/12 0820  . traZODone (DESYREL) tablet 50 mg  50 mg Oral QHS PRN,MR X 1 Verne Spurr, PA-C   50 mg at 08/07/12 2227   Lab Results:  Results for orders placed during the hospital encounter of 08/07/12 (from the past 48 hour(s))  COMPREHENSIVE  METABOLIC PANEL     Status: Abnormal   Collection Time   08/10/12  8:08 PM      Component Value Range Comment   Sodium 139  135 - 145 mEq/L    Potassium 3.9  3.5 - 5.1 mEq/L    Chloride 102  96 - 112 mEq/L    CO2 30  19 - 32 mEq/L    Glucose, Bld 88  70 - 99 mg/dL    BUN 10  6 - 23 mg/dL    Creatinine, Ser 1.61  0.50 - 1.10 mg/dL    Calcium 9.6  8.4 - 09.6 mg/dL    Total Protein 6.2  6.0 - 8.3 g/dL    Albumin 3.4 (*) 3.5 - 5.2 g/dL    AST 23  0 - 37 U/L    ALT 56 (*) 0 - 35 U/L    Alkaline Phosphatase 115  39 - 117 U/L    Total Bilirubin 0.2 (*) 0.3 - 1.2 mg/dL    GFR calc non Af Amer 79 (*) >90 mL/min    GFR calc Af Amer >90  >90 mL/min     Physical Findings: AIMS: Facial and Oral Movements Muscles of Facial Expression: None, normal Lips and Perioral Area: None, normal Jaw: None, normal Tongue: None, normal,Extremity Movements Upper (arms, wrists, hands, fingers): None, normal Lower (legs, knees, ankles, toes): None, normal, Trunk Movements Neck, shoulders, hips: None, normal, Overall Severity Severity of abnormal movements (highest score from questions above): None, normal Incapacitation due to abnormal movements: None, normal Patient's awareness of abnormal movements (rate only patient's report): No Awareness, Dental Status Current problems with teeth and/or dentures?: Yes (missing teeth ) Does patient usually wear dentures?: No  CIWA:  CIWA-Ar Total: 2  COWS:  COWS Total Score: 1   Review of Systems:  Neurological: The patient denies any headaches today. She denies any seizures or dizziness.  G.I.: The patient denies any constipation today or G.I. Upset.  Musculoskeletal: The patient denies any muscle or skeletal difficulties today.   Time was spent today discussing with the patient her current symptoms. The patient reports to sleeping well last night and reports a good appetite. She reports mild to moderate feelings of sadness, anhedonia and depressed mood today as well as mild to moderate anxiety symptoms. She denies any suicidal or homicidal ideations today as well as any visual hallucinations or delusional thinking. The patient does report auditory hallucinations which she states "makes me fear for my life."  She denies any medication related side effects today.   Treatment Plan Summary:  1. Daily contact with patient to assess and evaluate symptoms and progress in treatment.  2. Medication management  3. The patient will deny suicidal ideations or homicidal ideations for 48 hours prior to discharge and have a depression and anxiety rating of 3 or less. The patient will also deny any auditory or visual  hallucinations or delusional thinking.  4. The patient will deny any symptoms of substance withdrawal at time of discharge.   Plan:  1. Will continue the medication Haldol 10 mgs po qhs for psychosis.  2. Will start the medication Haldol at 2 mgs po q am to also address her psychotic symptoms. 3. Will continue the medication Lamictal at 150 mgs po qhs for mood stabilization.  4. Will continue the patient on the medication Neurontin at 100 mgs po q am, 2 pm and hs for anxiety.  5. Will continue the patient on her non-psychiatric medications as listed  above.  6. Laboratory studies reviewed.  7. Will continue to monitor.   Denai Caba 08/12/2012, 4:34 PM

## 2012-08-12 NOTE — Progress Notes (Signed)
D: Patient in dayroom interacting with peers.  Patient states that she had a good day.  Patient states she really enjoyed they music therapy today.  Patient states that her depression is minimal.  Patient denies SI/HI but states she does hear voices.  Patient states the voices are light and they are mumbling and she cannot make out what they are saying.  Patient states the voices are almost gone because she is taking her medications.  Patient contracts for safety.   A: Patient offered encouragement and support and to speak to staff for questions and/or concerns. Scheduled medications administered. Staff to monitor Q 15 mins for safety. R: Patient calm and cooperative and remains safe on the unit. Patient taking administered medications. Patient attended karaoke group tonight but came back to the unit early because pt states she was tired of it.

## 2012-08-12 NOTE — Progress Notes (Addendum)
Patient ID: Jackie Odonnell, female   DOB: July 30, 1961, 51 y.o.   MRN: [id number] Patient's self inventory sheet, patient sleeps well, has good appetite, normal energy level, improving attention span.   Rated depression and hopelessness #4.  Denied SI.  Denied physical problems.  After discharge patient plans to take better care of herself.  Does have discharge plans.  No problems taking meds after discharge. Attended discharge planning group this morning.  Stated she sleeps better at night.  Feels she is ready to go home.  Rated anxiety #4.  Stated she has been hearing voices which makes her feel she is "fearing for my life".   Voices are low now.  Goal is to get rid of voices.   Sister can pick her up at discharge.   Best discharge date is Saturday so her sister will not have to get off work.   Denied SI and HI.  Contracts for safety.   Denied visual hallucinations.   Denied pain. Patient has been attending groups today.

## 2012-08-12 NOTE — Discharge Planning (Addendum)
08/12/2012  SW met with [name] in discharge planning group.  SW found [name] to have adequate transportation upon discharge, as her sister will be coming to pick her up.  SW found YOLA PARADISO has current service providers, and they are [employer] and [employer].  Pt stated she has been hearing voices, and they are very low.  Pt stated she would like for the voices to be gone, but she also wants to go home.  Pt states the voices have her fearing for her life at times.  SW contacted [facility], and they stated they are not accepting new patients now, but pt can call in September when they will begin taking new patients so she can be screened.  SW found [name] to rate her depression at 4/10 and anxiety at 4/10 today.  SW will continue to assess for referrals.  Clarice Pole, LCASA 08/12/2012, 10:11 AM

## 2012-08-12 NOTE — Progress Notes (Signed)
[facility] Adult Inpatient Family/Significant Other Suicide Prevention Education  Suicide Prevention Education:  Education Completed; Jackie Odonnell (mother) [phone]) has been identified by the patient as the family member/significant other with whom the patient will be residing, and identified as the person(s) who will aid the patient in the event of a mental health crisis (suicidal ideations/suicide attempt).  With written consent from the patient, the family member/significant other has been provided the following suicide prevention education, prior to the and/or following the discharge of the patient.  The suicide prevention education provided includes the following:  Suicide risk factors  Suicide prevention and interventions  National Suicide Hotline telephone number  [facility] assessment telephone number  [facility] Emergency Assistance 911  [facility] and/or Residential Mobile Crisis Unit telephone number  Request made of family/significant other to:  Remove weapons (e.g., guns, rifles, knives), all items previously/currently identified as safety concern.    Remove drugs/medications (over-the-counter, prescriptions, illicit drugs), all items previously/currently identified as a safety concern.  The family member/significant other verbalizes understanding of the suicide prevention education information provided.  The family member/significant other agrees to remove the items of safety concern listed above.  Jackie Odonnell, Jackie Odonnell 08/12/2012, 3:21 PM

## 2012-08-13 MED ORDER — GABAPENTIN 100 MG PO CAPS
100.0000 mg | ORAL_CAPSULE | ORAL | Status: DC
  Administered 2012-08-13 – 2012-08-16 (×6): 100 mg via ORAL
  Filled 2012-08-13: qty 1
  Filled 2012-08-13: qty 28
  Filled 2012-08-13 (×6): qty 1
  Filled 2012-08-13: qty 28
  Filled 2012-08-13: qty 1

## 2012-08-13 NOTE — Progress Notes (Signed)
Psychoeducational Group Note  Date:  08/13/2012 Time:  1100  Group Topic/Focus:  Relapse Prevention Planning:   The focus of this group is to define relapse and discuss the need for planning to combat relapse.  Participation Level:  Active  Participation Quality:  Appropriate and Sharing  Affect:  Appropriate  Cognitive:  Appropriate  Insight:  Good  Engagement in Group:  Good  Additional Comments:  none  Ceasar Decandia M 08/13/2012, 7:07 PM

## 2012-08-13 NOTE — Discharge Planning (Signed)
08/13/2012  SW met with Jackie Odonnell in discharge planning group.  SW found Jackie Odonnell to have adequate transportation upon discharge.  SW found Jackie Odonnell to need contacts made on their behalf.  SW found KARINE GARN has current service providers, and they are [facility].  SW found Jackie Odonnell to rate her depression at 3/10 and anxiety at 4-5/10 today.  SW to continue to explore primary care physician options, but provided feedback about following up on [facility].  SW will continue to assess for referrals.  Clarice Pole, LCASA 08/13/2012, 11:20 AM

## 2012-08-13 NOTE — Progress Notes (Signed)
[facility] MD Progress Note  08/13/2012 5:24 PM  Diagnosis:  Axis I: Schizophrenia - Paranoid Type.   The patient was seen today and reports the following:   ADL's: Intact.  Sleep: The patient reports to sleeping well last night.  Appetite: The patient reports a good appetite.   Mild>(1-10) >Severe  Hopelessness (1-10): 0  Depression (1-10): 3  Anxiety (1-10): 4   Suicidal Ideation: The patient denies any suicidal ideations today.  Plan: No  Intent: No  Means: No   Homicidal Ideation: The patient denies any homicidal ideations today.  Plan: No  Intent: No.  Means: No   General Appearance/Behavior: The patient remained friendly and cooperative today with this provider.  Eye Contact: Good.  Speech: Appropriate in rate and volume with no pressuring noted today.  Motor Behavior: wnl.  Level of Consciousness: Alert and Oriented x 3.  Mental Status: Alert and Oriented x 3.  Mood: Mildly depressed.  Affect: Mildly constricted.  Anxiety Level: Mild to moderate anxiety reported today.  Thought Process: wnl.  Thought Content: The patient denies any visual hallucinations today and denies any delusional thinking. She does report ongoing auditory hallucinations which are improving since increasing the medication Haldol.  The patient states today that she feels "they are finally going away." Perception:. wnl.  Judgment: Good.  Insight: Good.  Cognition: Oriented to person, place and time.  Sleep:  Number of Hours: 5.75    Vital Signs:Blood pressure 102/72, pulse 90, temperature 98.5 F (36.9 C), temperature source Oral, resp. rate 18, height 5' 5.75" (1.67 m), weight 80.287 kg (177 lb), SpO2 100.00%.  Current Medications: Current Facility-Administered Medications  Medication Dose Route Frequency Provider Last Rate Last Dose  . acetaminophen (TYLENOL) tablet 650 mg  650 mg Oral Q6H PRN Verne Spurr, PA-C   650 mg at 08/08/12 2117  . alum & mag hydroxide-simeth (MAALOX/MYLANTA)  200-200-20 MG/5ML suspension 30 mL  30 mL Oral Q4H PRN Verne Spurr, PA-C      . benztropine (COGENTIN) tablet 0.5 mg  0.5 mg Oral BH-qamhs Curlene Labrum Travor Royce, MD   0.5 mg at 08/13/12 0748  . gabapentin (NEURONTIN) capsule 100 mg  100 mg Oral BH-qamhs Viva Gallaher D Myiah Petkus, MD      . haloperidol (HALDOL) tablet 10 mg  10 mg Oral QHS Curlene Labrum Bleu Minerd, MD   10 mg at 08/12/12 2134  . haloperidol (HALDOL) tablet 2 mg  2 mg Oral Q breakfast Curlene Labrum Tiegan Terpstra, MD   2 mg at 08/13/12 0748  . lisinopril (PRINIVIL,ZESTRIL) tablet 10 mg  10 mg Oral Daily Curlene Labrum Deneen Slager, MD   10 mg at 08/13/12 0748   And  . hydrochlorothiazide (MICROZIDE) capsule 12.5 mg  12.5 mg Oral Daily Curlene Labrum Jatavis Malek, MD   12.5 mg at 08/13/12 0748  . lamoTRIgine (LAMICTAL) tablet 150 mg  150 mg Oral QHS Curlene Labrum Zaryia Markel, MD   150 mg at 08/12/12 2134  . magnesium hydroxide (MILK OF MAGNESIA) suspension 30 mL  30 mL Oral Daily PRN Verne Spurr, PA-C      . metoprolol succinate (TOPROL-XL) 24 hr tablet 100 mg  100 mg Oral Daily Curlene Labrum Rionna Feltes, MD   100 mg at 08/13/12 0748  . nicotine (NICODERM CQ - dosed in mg/24 hours) patch 21 mg  21 mg Transdermal Q0600 Curlene Labrum Kaitlynn Tramontana, MD   21 mg at 08/13/12 4098  . potassium chloride SA (K-DUR,KLOR-CON) CR tablet 20 mEq  20 mEq Oral Daily Ronny Bacon, MD  20 mEq at 08/13/12 0748  . traZODone (DESYREL) tablet 50 mg  50 mg Oral QHS PRN,MR X 1 Verne Spurr, PA-C   50 mg at 08/07/12 2227  . DISCONTD: gabapentin (NEURONTIN) capsule 100 mg  100 mg Oral [id number] Curlene Labrum Victor Langenbach, MD   100 mg at 08/13/12 1358   Lab Results: No results found for this or any previous visit (from the past 48 hour(s)).  Physical Findings: AIMS: Facial and Oral Movements Muscles of Facial Expression: None, normal Lips and Perioral Area: None, normal Jaw: None, normal Tongue: None, normal,Extremity Movements Upper (arms, wrists, hands, fingers): None, normal Lower (legs, knees, ankles, toes): None, normal, Trunk  Movements Neck, shoulders, hips: None, normal, Overall Severity Severity of abnormal movements (highest score from questions above): None, normal Incapacitation due to abnormal movements: None, normal Patient's awareness of abnormal movements (rate only patient's report): No Awareness, Dental Status Current problems with teeth and/or dentures?: Yes (missing teeth ) Does patient usually wear dentures?: No  CIWA:  CIWA-Ar Total: 2  COWS:  COWS Total Score: 1   Review of Systems:  Neurological: The patient denies any headaches today. She denies any seizures or dizziness.  G.I.: The patient denies any constipation today or G.I. Upset.  Musculoskeletal: The patient denies any muscle or skeletal difficulties today.   Time was spent today discussing with the patient her current symptoms. The patient reports again to sleeping well last night and reports a good appetite. She reports mild feelings of sadness, anhedonia and depressed mood today as well as mild to moderate anxiety symptoms.  She denies any suicidal or homicidal ideations today as well as any visual hallucinations or delusional thinking. The patient does report auditory hallucinations which are improving since increasing the medication Haldol.  The patient states today that she feels "they are finally going away."  Treatment Plan Summary:  1. Daily contact with patient to assess and evaluate symptoms and progress in treatment.  2. Medication management  3. The patient will deny suicidal ideations or homicidal ideations for 48 hours prior to discharge and have a depression and anxiety rating of 3 or less. The patient will also deny any auditory or visual hallucinations or delusional thinking.  4. The patient will deny any symptoms of substance withdrawal at time of discharge.   Plan:  1. Will continue the medication Haldol 10 mgs po qhs for psychosis.  2. Will continue the medication Haldol at 2 mgs po q am to also address her psychotic  symptoms.  3. Will continue the medication Lamictal at 150 mgs po qhs for mood stabilization.  4. Will continue the patient on the medication Neurontin at 100 mgs po q am, 2 pm and hs for anxiety.  5. Will continue the patient on her non-psychiatric medications as listed above.  6. Laboratory studies reviewed.  7. Will continue to monitor.   Abdulkarim Eberlin 08/13/2012, 5:24 PM

## 2012-08-13 NOTE — Progress Notes (Signed)
Psychoeducational Group Note  Date:  08/12/2012 Time:  2000  Group Topic/Focus:  karaoke  Participation Level:  Active  Participation Quality:  Appropriate  Affect:  Appropriate  Cognitive:  Alert  Insight:  Good  Engagement in Group:  Good  Additional Comments:    Fernandez Kenley R 08/13/2012, 1:46 AM

## 2012-08-13 NOTE — Progress Notes (Signed)
D: Patient denies SI/HI and auditory and visual hallucinations. The patient has a depressed mood and affect. The patient rates her depression a 2 out of 10 and her hopelessness a 4 out of 10 (1 low/10 high). The patient reports sleeping well and states that her appetite and energy level are normal. The patient is attending groups on the unit and is actively participating in them.  A: Patient given emotional support from RN. Patient encouraged to come to staff with concerns and/or questions. Patient's medication routine continued. Patient's orders and plan of care reviewed.  R: Patient remains appropriate and cooperative. Will continue to monitor patient q15 minutes for safety.

## 2012-08-13 NOTE — Progress Notes (Signed)
08/13/2012         Time: 0930      Group Topic/Focus: The focus of this group is on discussing various aspects of wellness, balancing those aspects and exploring ways to increase the ability to experience wellness.  Participation Level: Active  Participation Quality: Appropriate and Attentive  Affect: Appropriate  Cognitive: Oriented   Additional Comments: None.   Viviene Thurston 08/13/2012 11:55 AM

## 2012-08-14 DIAGNOSIS — F200 Paranoid schizophrenia: Secondary | ICD-10-CM

## 2012-08-14 NOTE — Progress Notes (Signed)
[facility] Group Notes:  (Counselor/Nursing/MHT/Case Management/Adjunct)  08/14/2012 11 AM  Type of Therapy:  Aftercare Planning, Group Therapy, Dance/Movement Therapy   Participation Level:  Active  Participation Quality:  Appropriate  Affect:  Depressed  Cognitive:  Appropriate and Oriented  Insight:  Limited  Engagement in Group:  Limited  Engagement in Therapy:  Limited  Modes of Intervention:  Clarification, Problem-solving, Role-play, Socialization and Support  Summary of Progress/Problems: After Care: Pt. attended and participated in aftercare planning group. Pt. accepted information on suicide prevention, warning signs to look for with suicide and crisis line numbers to use. The pt. agreed to call crisis line numbers if having warning signs or having thoughts of suicide. Pt. Stated that they were feeling "bright", like sunshine Counseling:  Pt participated in a discussion on how to use positive coping skills Pt identified that she is a "team player" and that she is grateful for her family. Gevena Mart

## 2012-08-14 NOTE — Progress Notes (Signed)
Pt has been up and has been active while in the milieu today, has been actively attending and participating in various activities, pt did endorse having some feelings of anxiety today, pt has taken all medications without incident today, pt has not verbalized any further complaints, support and encouragement provided, will continue to monitor

## 2012-08-14 NOTE — Progress Notes (Signed)
Psychoeducational Group Note  Date:  08/14/2012 Time: 1515  Group Topic/Focus:  Healthy Communication:   The focus of this group is to discuss communication, barriers to communication, as well as healthy ways to communicate with others.  Participation Level:  Active  Participation Quality:  Appropriate, Sharing and Supportive  Affect:  Appropriate  Cognitive:  Appropriate  Insight:  Good  Engagement in Group:  Good  Additional Comments:  none  Kaynen Minner M 08/14/2012, 3:45 PM

## 2012-08-14 NOTE — Progress Notes (Signed)
[facility] Group Notes:  (Counselor/Nursing/MHT/Case Management/Adjunct)  08/14/2012 8:56 PM  Type of Therapy:  Wrap up   Participation Level:  Active  Participation Quality:  Appropriate, Attentive and Sharing  Affect:  Appropriate  Cognitive:  Alert, Appropriate and Oriented  Insight:  Good  Engagement in Group:  Good  Engagement in Therapy:  Good  Modes of Intervention:  Socialization  Summary of Progress/Problems:   Jackie Odonnell 08/14/2012, 8:56 PM

## 2012-08-14 NOTE — Progress Notes (Signed)
[facility] MD Progress Note  08/14/2012 3:14 PM  Diagnosis:  Paranoid schizophrenia, chronic  ADL's:  Intact  Sleep: Fair  Appetite:  Fair  Suicidal Ideation:  Denied suicidal ideation, intentions and plans Homicidal Ideation:  Denied homicidal ideation, intentions, or plan  AEB (as evidenced by): Patient appeared lying down in her bed for a socialization and participation on the unit. Patient has been compliant with her medication and reported her medication is helping her to control her symptoms. Patient stated that she may be discharged to home on Monday. Patient lives alone in an apartment in [place].  Mental Status Examination/Evaluation: Objective:  Appearance: Fairly Groomed  Patent attorney::  Fair  Speech:  Normal Rate and Slow  Volume:  Decreased  Mood:  Anxious and Depressed  Affect:  Flat  Thought Process:  Coherent and Intact  Orientation:  Full  Thought Content:  WDL  Suicidal Thoughts:  No  Homicidal Thoughts:  No  Memory:  Immediate;   Fair  Judgement:  Intact  Insight:  Fair  Psychomotor Activity:  Normal  Concentration:  Fair  Recall:  Fair  Akathisia:  No  Handed:  Right  AIMS (if indicated):     Assets:  Communication Skills Desire for Improvement Housing Intimacy Leisure Time Physical Health Resilience  Sleep:  Number of Hours: 6.75    Vital Signs:Blood pressure 103/73, pulse 93, temperature 98.1 F (36.7 C), temperature source Oral, resp. rate 20, height 5' 5.75" (1.67 m), weight 177 lb (80.287 kg), SpO2 100.00%. Current Medications: Current Facility-Administered Medications  Medication Dose Route Frequency Provider Last Rate Last Dose  . acetaminophen (TYLENOL) tablet 650 mg  650 mg Oral Q6H PRN Verne Spurr, PA-C   650 mg at 08/08/12 2117  . alum & mag hydroxide-simeth (MAALOX/MYLANTA) 200-200-20 MG/5ML suspension 30 mL  30 mL Oral Q4H PRN Verne Spurr, PA-C      . benztropine (COGENTIN) tablet 0.5 mg  0.5 mg Oral BH-qamhs Curlene Labrum Readling, MD    0.5 mg at 08/14/12 0811  . gabapentin (NEURONTIN) capsule 100 mg  100 mg Oral BH-qamhs Randy D Readling, MD   100 mg at 08/14/12 9811  . haloperidol (HALDOL) tablet 10 mg  10 mg Oral QHS Curlene Labrum Readling, MD   10 mg at 08/13/12 2139  . haloperidol (HALDOL) tablet 2 mg  2 mg Oral Q breakfast Curlene Labrum Readling, MD   2 mg at 08/14/12 9147  . lisinopril (PRINIVIL,ZESTRIL) tablet 10 mg  10 mg Oral Daily Curlene Labrum Readling, MD   10 mg at 08/14/12 8295   And  . hydrochlorothiazide (MICROZIDE) capsule 12.5 mg  12.5 mg Oral Daily Curlene Labrum Readling, MD   12.5 mg at 08/14/12 0816  . lamoTRIgine (LAMICTAL) tablet 150 mg  150 mg Oral QHS Curlene Labrum Readling, MD   150 mg at 08/13/12 2140  . magnesium hydroxide (MILK OF MAGNESIA) suspension 30 mL  30 mL Oral Daily PRN Verne Spurr, PA-C      . metoprolol succinate (TOPROL-XL) 24 hr tablet 100 mg  100 mg Oral Daily Curlene Labrum Readling, MD   100 mg at 08/14/12 0811  . nicotine (NICODERM CQ - dosed in mg/24 hours) patch 21 mg  21 mg Transdermal Q0600 Curlene Labrum Readling, MD   21 mg at 08/14/12 0636  . potassium chloride SA (K-DUR,KLOR-CON) CR tablet 20 mEq  20 mEq Oral Daily Curlene Labrum Readling, MD   20 mEq at 08/14/12 0811  . traZODone (DESYREL) tablet 50 mg  50 mg Oral QHS PRN,MR X 1 Verne Spurr, PA-C   50 mg at 08/07/12 2227  . DISCONTD: gabapentin (NEURONTIN) capsule 100 mg  100 mg Oral [id number] Curlene Labrum Readling, MD   100 mg at 08/13/12 1358    Lab Results: No results found for this or any previous visit (from the past 48 hour(s)).  Physical Findings: AIMS: Facial and Oral Movements Muscles of Facial Expression: None, normal Lips and Perioral Area: None, normal Jaw: None, normal Tongue: None, normal,Extremity Movements Upper (arms, wrists, hands, fingers): None, normal Lower (legs, knees, ankles, toes): None, normal, Trunk Movements Neck, shoulders, hips: None, normal, Overall Severity Severity of abnormal movements (highest score from questions above): None,  normal Incapacitation due to abnormal movements: None, normal Patient's awareness of abnormal movements (rate only patient's report): No Awareness, Dental Status Current problems with teeth and/or dentures?: Yes (missing teeth ) Does patient usually wear dentures?: No  CIWA:  CIWA-Ar Total: 2  COWS:  COWS Total Score: 1   Treatment Plan Summary: Daily contact with patient to assess and evaluate symptoms and progress in treatment Medication management  Plan: Continue current treatment plan and medication management.  Patte Winkel,JANARDHAHA R. 08/14/2012, 3:14 PM

## 2012-08-14 NOTE — Progress Notes (Signed)
Patient ID: Jackie Odonnell, female   DOB: 1961-10-27, 51 y.o.   MRN: [id number] D. The patient is pleasant and interacting appropriately in the milieu. Stated that she is feeling better because the voices are going away. Denied any suicidal ideation. Attended evening wrap up group. A. Met with patient 1:1. Verbal support given. HS medications administered. R. Pt. denies suicidal ideation and auditory hallucination. Remains safe.

## 2012-08-15 NOTE — Progress Notes (Signed)
[facility] MD Progress Note  08/15/2012 11:41 AM  Diagnosis:  Axis I: Chronic Paranoid Schizophrenia  ADL's:  Intact  Sleep: Fair  Appetite:  Fair  Suicidal Ideation:  Patient stated that she I don't want to hurt myself or other people who last time I have suicidal thoughts for a few days ago Homicidal Ideation:  No homicidal ideation, intentions or plans  AEB (as evidenced by): Patient has been compliant with her medication without adverse effects. Patient stated she was not used to get up early in the morning, but continued to report depression. Rate of 3/10. Anxiety, 3/10 and continued to hear the voices, but mumbled in her years, but not getting upset or scared. She want to the take care of herself without any help from other people. She is hoping to go home after discharge and the hire a new lawyer for Social Security disability because previously one d2id not work out for her.  Mental Status Examination/Evaluation: Objective:  Appearance: Casual and Fairly Groomed  Eye Contact::  Good  Speech:  Clear and Coherent  Volume:  Normal  Mood:  Anxious and Depressed  Affect:  Flat  Thought Process:  Coherent and Goal Directed  Orientation:  Full  Thought Content:  WDL  Suicidal Thoughts:  No  Homicidal Thoughts:  No  Memory:  Immediate;   Fair  Judgement:  Fair  Insight:  Fair  Psychomotor Activity:  Normal  Concentration:  Fair  Recall:  Fair  Akathisia:  No  Handed:  Right  AIMS (if indicated):     Assets:  Communication Skills Desire for Improvement Housing Physical Health Resilience Social Support Transportation  Sleep:  Number of Hours: 6.25    Vital Signs:Blood pressure 100/72, pulse 97, temperature 97.8 F (36.6 C), temperature source Oral, resp. rate 17, height 5' 5.75" (1.67 m), weight 177 lb (80.287 kg), SpO2 100.00%. Current Medications: Current Facility-Administered Medications  Medication Dose Route Frequency Provider Last Rate Last Dose  . acetaminophen  (TYLENOL) tablet 650 mg  650 mg Oral Q6H PRN Verne Spurr, PA-C   650 mg at 08/08/12 2117  . alum & mag hydroxide-simeth (MAALOX/MYLANTA) 200-200-20 MG/5ML suspension 30 mL  30 mL Oral Q4H PRN Verne Spurr, PA-C      . benztropine (COGENTIN) tablet 0.5 mg  0.5 mg Oral BH-qamhs Curlene Labrum Readling, MD   0.5 mg at 08/15/12 0742  . gabapentin (NEURONTIN) capsule 100 mg  100 mg Oral BH-qamhs Randy D Readling, MD   100 mg at 08/15/12 1610  . haloperidol (HALDOL) tablet 10 mg  10 mg Oral QHS Curlene Labrum Readling, MD   10 mg at 08/14/12 2117  . haloperidol (HALDOL) tablet 2 mg  2 mg Oral Q breakfast Curlene Labrum Readling, MD   2 mg at 08/15/12 0742  . lisinopril (PRINIVIL,ZESTRIL) tablet 10 mg  10 mg Oral Daily Curlene Labrum Readling, MD   10 mg at 08/15/12 9604   And  . hydrochlorothiazide (MICROZIDE) capsule 12.5 mg  12.5 mg Oral Daily Curlene Labrum Readling, MD   12.5 mg at 08/15/12 0742  . lamoTRIgine (LAMICTAL) tablet 150 mg  150 mg Oral QHS Curlene Labrum Readling, MD   150 mg at 08/14/12 2117  . magnesium hydroxide (MILK OF MAGNESIA) suspension 30 mL  30 mL Oral Daily PRN Verne Spurr, PA-C      . metoprolol succinate (TOPROL-XL) 24 hr tablet 100 mg  100 mg Oral Daily Curlene Labrum Readling, MD   100 mg at 08/15/12 0742  .  nicotine (NICODERM CQ - dosed in mg/24 hours) patch 21 mg  21 mg Transdermal Q0600 Curlene Labrum Readling, MD   21 mg at 08/15/12 0626  . potassium chloride SA (K-DUR,KLOR-CON) CR tablet 20 mEq  20 mEq Oral Daily Curlene Labrum Readling, MD   20 mEq at 08/15/12 0742  . traZODone (DESYREL) tablet 50 mg  50 mg Oral QHS PRN,MR X 1 Verne Spurr, PA-C   50 mg at 08/07/12 2227    Lab Results: No results found for this or any previous visit (from the past 48 hour(s)).  Physical Findings: AIMS: Facial and Oral Movements Muscles of Facial Expression: None, normal Lips and Perioral Area: None, normal Jaw: None, normal Tongue: None, normal,Extremity Movements Upper (arms, wrists, hands, fingers): None, normal Lower (legs,  knees, ankles, toes): None, normal, Trunk Movements Neck, shoulders, hips: None, normal, Overall Severity Severity of abnormal movements (highest score from questions above): None, normal Incapacitation due to abnormal movements: None, normal Patient's awareness of abnormal movements (rate only patient's report): No Awareness, Dental Status Current problems with teeth and/or dentures?: Yes (missing teeth ) Does patient usually wear dentures?: No  CIWA:  CIWA-Ar Total: 2  COWS:  COWS Total Score: 1   Treatment Plan Summary: Daily contact with patient to assess and evaluate symptoms and progress in treatment Medication management  Plan: Continue current treatment plan and medication management without any changes during this visit.  Kaipo Ardis,JANARDHAHA R. 08/15/2012, 11:41 AM

## 2012-08-15 NOTE — Progress Notes (Signed)
Currently resting quietly in bed with eyes closed. Respirations are even and unlabored. No acute distress noted. Safety has been maintained with Q15 minute observation. Will continue current POC. 

## 2012-08-15 NOTE — Progress Notes (Signed)
Psychoeducational Group Note  Date:  08/15/2012 Time:  1515  Group Topic/Focus:  Crisis Planning:   The purpose of this group is to help patients create a crisis plan for use upon discharge or in the future, as needed.  Participation Level:  Did Not Attend  Participation Quality:    Affect:    Cognitive:    Insight:    Engagement in Group:    Additional Comments:  Pt was sleeping.  Isla Pence M 08/15/2012, 6:03 PM

## 2012-08-15 NOTE — Progress Notes (Signed)
Psychoeducational Group Note  Date:  08/15/2012 Time:  0930  Group Topic/Focus:  Making Healthy Choices:   The focus of this group is to help patients identify negative/unhealthy choices they were using prior to admission and identify positive/healthier coping strategies to replace them upon discharge.  Participation Level:  Active  Participation Quality:  Attentive  Affect:  Appropriate  Cognitive:  Appropriate  Insight:  Good  Engagement in Group:  Good  Additional Comments:  Had a lot to offer to group, hoping to go home soon and wanting to make discharge a good one.  Roselee Culver 08/15/2012, 10:25 AM

## 2012-08-15 NOTE — Progress Notes (Signed)
Jackie Odonnell states she slept well last nite and her appetite is good,energy level is normal and ability to pay attention is improving, depressed 3/10 and hopeless 3/10 today, this is less and much inproved over the last few days she states, denies Si or HI and has been attending group and participating, just wants to make sure she takes care of herself A q57min safety checks continue and support offered her, encouraged her to continue attending groups and participating R patient is safe on the unit

## 2012-08-15 NOTE — Progress Notes (Signed)
Patient ID: Jackie Odonnell, female   DOB: May 16, 1961, 51 y.o.   MRN: [id number] D. The patient is bright and social this evening, interacting appropriately in the dayroom. Attended evening group. Is hoping for discharge tomorrow.  A. Met with patient 1:1. Assessed for hallucinations and suicide risk. Reviewed medications. R. The patient denied suicidal thoughts or a/v hallucinations. Stated that the increased dose of Haldol has eliminated the voices.

## 2012-08-15 NOTE — Progress Notes (Signed)
[facility] Group Notes:  (Counselor/Nursing/MHT/Case Management/Adjunct)  08/15/2012 11:27 PM  Type of Therapy:  Psychoeducational Skills  Participation Level:  Minimal  Participation Quality:  Appropriate  Affect:    Cognitive:  Appropriate  Insight:  Limited  Engagement in Group:  Limited  Engagement in Therapy:  Limited  Modes of Intervention:  Support  Summary of Progress/Problems:Pt. Did not disclose much information and had minimal participation during group.    Jola Baptist 08/15/2012, 11:27 PM

## 2012-08-15 NOTE — Progress Notes (Signed)
Patient ID: Jackie Odonnell, female   DOB: 1961-05-19, 51 y.o.   MRN: [id number]   [facility] Group Notes:  (Counselor/Nursing/MHT/Case Management/Adjunct)  08/15/2012 11 AM  Type of Therapy:  Aftercare Planning, Group Therapy, Dance/Movement Therapy   Participation Level:  Active  Participation Quality:  Appropriate  Affect:  Appropriate  Cognitive:  Appropriate  Insight:  Limited  Engagement in Group:  Limited  Engagement in Therapy:  Limited  Modes of Intervention:  Clarification, Problem-solving, Role-play, Socialization and Support  Summary of Progress/Problems: After Care: Pt. attended and participated in aftercare planning group. Pt. verbally accepted information on suicide prevention, warning signs to look for with suicide and crisis line numbers to use. Pt shared that she was feeling good today.  Counseling:  Therapist and group members discussed how we support ourselves and how we can express ourselves using our bodies. Group members responded to music and rhythm and therapist encouraged group members to support themselves with music and that we can change how we feel by doing something different.    Cassidi Long 08/15/2012. 11:42 AM

## 2012-08-16 MED ORDER — BENZTROPINE MESYLATE 0.5 MG PO TABS: 0.5000 mg | ORAL_TABLET | ORAL | Status: DC

## 2012-08-16 MED ORDER — HALOPERIDOL 10 MG PO TABS: 10.0000 mg | ORAL_TABLET | Freq: Every day | ORAL | Status: DC

## 2012-08-16 MED ORDER — HALOPERIDOL 2 MG PO TABS: 2.0000 mg | ORAL_TABLET | Freq: Every day | ORAL | Status: DC

## 2012-08-16 MED ORDER — HYDROCHLOROTHIAZIDE 12.5 MG PO CAPS: 12.5000 mg | ORAL_CAPSULE | Freq: Every day | ORAL | Status: DC

## 2012-08-16 MED ORDER — METOPROLOL SUCCINATE ER 100 MG PO TB24: 100.0000 mg | ORAL_TABLET | Freq: Every day | ORAL | Status: DC

## 2012-08-16 MED ORDER — LAMOTRIGINE 150 MG PO TABS: 150.0000 mg | ORAL_TABLET | Freq: Every day | ORAL | Status: DC

## 2012-08-16 MED ORDER — IPRATROPIUM-ALBUTEROL 20-100 MCG/ACT IN AERS: 1.0000 | INHALATION_SPRAY | Freq: Four times a day (QID) | RESPIRATORY_TRACT | Status: DC | PRN

## 2012-08-16 MED ORDER — LISINOPRIL 10 MG PO TABS: 10.0000 mg | ORAL_TABLET | Freq: Every day | ORAL | Status: DC

## 2012-08-16 MED ORDER — POTASSIUM CHLORIDE CRYS ER 20 MEQ PO TBCR: 20.0000 meq | EXTENDED_RELEASE_TABLET | Freq: Every day | ORAL | Status: DC

## 2012-08-16 MED ORDER — GABAPENTIN 100 MG PO CAPS: 100.0000 mg | ORAL_CAPSULE | ORAL | Status: DC

## 2012-08-16 MED ORDER — METOPROLOL SUCCINATE ER 100 MG PO TB24: 100.0000 mg | ORAL_TABLET | Freq: Every day | ORAL | Status: AC

## 2012-08-16 NOTE — Progress Notes (Signed)
Nursing discharge note: patient discharge home per MD order.  Reviewed discharge instructions with patient.  Medications and prescriptions were also reviewed with patient.  She received all her personal belongings out of her room and locker.  She will follow up with [facility] and has a referral to [facility].  She plans to return to her apartment to live and her sister came to pick her up.  She was appreciative of staff's assistance.  She denies any SI/HI/AVH.

## 2012-08-16 NOTE — [facility] Suicide Risk Assessment (Signed)
Suicide Risk Assessment  Discharge Assessment     Demographic factors:  Low socioeconomic status;Living alone;Unemployed  Current Mental Status Per Nursing Assessment::   On Admission:  Self-harm thoughts At Discharge:  Time was spent today discussing with the patient her current symptoms. The patient reports again to sleeping well last night and reports a good appetite. She reports mild feelings of sadness, anhedonia and depressed mood today as well as mild anxiety symptoms.  She adamantly denies any suicidal or homicidal ideations today as well as any visual hallucinations or delusional thinking. The patient reports that her auditory hallucinations are "down to a mumble."  She reports they are the best they have been in years.  She asked today if she could be discharged to outpatient follow up and this will be ordered.  Current Mental Status Per Physician:  Diagnosis:  Axis I: Schizophrenia - Paranoid Type.   The patient was seen today and reports the following:   ADL's: Intact.  Sleep: The patient reports to sleeping well last night.  Appetite: The patient reports a good appetite.   Mild>(1-10) >Severe  Hopelessness (1-10): 0  Depression (1-10): 2-3  Anxiety (1-10): 3   Suicidal Ideation: The patient adamantly denies any suicidal ideations today.  Plan: No  Intent: No  Means: No   Homicidal Ideation: The patient adamantly denies any homicidal ideations today.  Plan: No  Intent: No.  Means: No   General Appearance/Behavior: The patient remains friendly and cooperative today with this provider.  Eye Contact: Good.  Speech: Appropriate in rate and volume with no pressuring noted today.  Motor Behavior: wnl.  Level of Consciousness: Alert and Oriented x 3.  Mental Status: Alert and Oriented x 3.  Mood: Mildly depressed.  Affect: Mildly constricted.  Anxiety Level: Mild anxiety reported today.  Thought Process: wnl.  Thought Content: The patient denies any visual  hallucinations today and denies any delusional thinking. The patient reports that her auditory hallucinations are "down to a mumble."  She reports they are the best they have been in years. Perception:. wnl.  Judgment: Good.  Insight: Good.  Cognition: Oriented to person, place and time.   Loss Factors: Financial problems / change in socioeconomic status  Historical Factors: Family history of mental illness or substance abuse.  History of mental illness.  Risk Reduction Factors:   Good insight into mental illness.  Good assess to healthcare.  Continued Clinical Symptoms:  Schizophrenia:   Paranoid or undifferentiated type Previous Psychiatric Diagnoses and Treatments Medical Diagnoses and Treatments/Surgeries  Discharge Diagnoses: AXIS I:   Schizophrenia - Paranoid Type.  AXIS II:   Deferred AXIS III:   1. Hypertension. AXIS IV:   Chronic Mental Illness.  Non-compliance with medications. AXIS V:   GAF at time of admission approximately 35.  GAF at time of discharge approximately 50.  Cognitive Features That Contribute To Risk:  None Noted.    Current Medications:  Current Facility-Administered Medications   Medication  Dose  Route  Frequency  Provider  Last Rate  Last Dose   .  acetaminophen (TYLENOL) tablet 650 mg  650 mg  Oral  Q6H PRN  Verne Spurr, PA-C   650 mg at 08/08/12 2117   .  alum & mag hydroxide-simeth (MAALOX/MYLANTA) 200-200-20 MG/5ML suspension 30 mL  30 mL  Oral  Q4H PRN  Verne Spurr, PA-C     .  benztropine (COGENTIN) tablet 0.5 mg  0.5 mg  Oral  BH-qamhs  Ronny Bacon, MD  0.5 mg at 08/13/12 0748   .  gabapentin (NEURONTIN) capsule 100 mg  100 mg  Oral  BH-qamhs  Perla Echavarria D Macari Zalesky, MD     .  haloperidol (HALDOL) tablet 10 mg  10 mg  Oral  QHS  Curlene Labrum Daziah Hesler, MD   10 mg at 08/12/12 2134   .  haloperidol (HALDOL) tablet 2 mg  2 mg  Oral  Q breakfast  Curlene Labrum Navraj Dreibelbis, MD   2 mg at 08/13/12 0748   .  lisinopril (PRINIVIL,ZESTRIL) tablet 10 mg  10 mg   Oral  Daily  Curlene Labrum Kelcey Wickstrom, MD   10 mg at 08/13/12 0748    And   .  hydrochlorothiazide (MICROZIDE) capsule 12.5 mg  12.5 mg  Oral  Daily  Curlene Labrum Nikira Kushnir, MD   12.5 mg at 08/13/12 0748   .  lamoTRIgine (LAMICTAL) tablet 150 mg  150 mg  Oral  QHS  Curlene Labrum Reika Callanan, MD   150 mg at 08/12/12 2134   .  magnesium hydroxide (MILK OF MAGNESIA) suspension 30 mL  30 mL  Oral  Daily PRN  Verne Spurr, PA-C     .  metoprolol succinate (TOPROL-XL) 24 hr tablet 100 mg  100 mg  Oral  Daily  Curlene Labrum Kei Langhorst, MD   100 mg at 08/13/12 0748   .  nicotine (NICODERM CQ - dosed in mg/24 hours) patch 21 mg  21 mg  Transdermal  Q0600  Curlene Labrum Rasheena Talmadge, MD   21 mg at 08/13/12 7829   .  potassium chloride SA (K-DUR,KLOR-CON) CR tablet 20 mEq  20 mEq  Oral  Daily  Curlene Labrum Guilford Shannahan, MD   20 mEq at 08/13/12 0748   .  traZODone (DESYREL) tablet 50 mg  50 mg  Oral  QHS PRN,MR X 1  Verne Spurr, PA-C   50 mg at 08/07/12 2227   .  DISCONTD: gabapentin (NEURONTIN) capsule 100 mg  100 mg  Oral  [id number]  Curlene Labrum Teaghan Melrose, MD   100 mg at 08/13/12 1358    Lab Results: No results found for this or any previous visit (from the past 48 hour(s)).   Physical Findings:  AIMS: Facial and Oral Movements  Muscles of Facial Expression: None, normal  Lips and Perioral Area: None, normal  Jaw: None, normal  Tongue: None, normal,Extremity Movements  Upper (arms, wrists, hands, fingers): None, normal  Lower (legs, knees, ankles, toes): None, normal, Trunk Movements  Neck, shoulders, hips: None, normal, Overall Severity  Severity of abnormal movements (highest score from questions above): None, normal  Incapacitation due to abnormal movements: None, normal  Patient's awareness of abnormal movements (rate only patient's report): No Awareness, Dental Status  Current problems with teeth and/or dentures?: Yes (missing teeth )  Does patient usually wear dentures?: No  CIWA: CIWA-Ar Total: 2  COWS: COWS Total Score: 1   Review of  Systems:  Neurological: The patient denies any headaches today. She denies any seizures or dizziness.  G.I.: The patient denies any constipation today or G.I. Upset.  Musculoskeletal: The patient denies any muscle or skeletal difficulties today.   Time was spent today discussing with the patient her current symptoms. The patient reports again to sleeping well last night and reports a good appetite. She reports mild feelings of sadness, anhedonia and depressed mood today as well as mild anxiety symptoms.  She adamantly denies any suicidal or homicidal ideations today as well as any visual hallucinations or delusional  thinking. The patient reports that her auditory hallucinations are "down to a mumble."  She reports they are the best they have been in years.  She asked today if she could be discharged to outpatient follow up and this will be ordered.  Treatment Plan Summary:  1. Daily contact with patient to assess and evaluate symptoms and progress in treatment.  2. Medication management  3. The patient will deny suicidal ideations or homicidal ideations for 48 hours prior to discharge and have a depression and anxiety rating of 3 or less. The patient will also deny any auditory or visual hallucinations or delusional thinking.  4. The patient will deny any symptoms of substance withdrawal at time of discharge.   Plan:  1. Will continue the medication Haldol 10 mgs po qhs for psychosis.  2. Will continue the medication Haldol at 2 mgs po q am to also address her psychotic symptoms.  3. Will continue the medication Lamictal at 150 mgs po qhs for mood stabilization.  4. Will continue the patient on the medication Neurontin at 100 mgs po q am, 2 pm and hs for anxiety.  5. Will continue the patient on her non-psychiatric medications as listed above.  6. Laboratory studies reviewed.  7. Will continue to monitor.  8. The patient will be discharged today at there request to outpatient follow  up.  Suicide Risk:  Minimal: No identifiable suicidal ideation.  Patients presenting with no risk factors but with morbid ruminations; may be classified as minimal risk based on the severity of the depressive symptoms  Plan Of Care/Follow-up recommendations:  Activity:  As tolerated. Diet:  Heart Healthy Diet. Other:  Please take all medications only as directed and keep all scheduled follow up appointments.  Please return to your local Emergency Department should you have any thoughts of harming yourself or others.  Quenna Doepke 08/16/2012, 2:20 PM

## 2012-08-16 NOTE — Progress Notes (Signed)
Patient ID: Jackie Odonnell, female   DOB: December 14, 1961, 51 y.o.   MRN:  1:1 Nursing note: The patient is resting in bed with eyes closed, but is not sleeping. She has been awake most of the night. Stated that she thinks her insomnia may stem from anxiety related to anticipating discharge . She is anxious to get back to her group home where she has lived for 7 years.  1:1 maintained for safety.Will continue to monitor.

## 2012-08-16 NOTE — Tx Team (Signed)
Interdisciplinary Treatment Plan Update (Adult)  Date: 08/16/2012  Time Reviewed: 11:19 AM   Progress in Treatment: Attending groups: Yes Participating in groups:  Yes Taking medication as prescribed: Yes Tolerating medication:  Yes Family/Significant othe contact made: Yes  Patient understands diagnosis:  Yes Discussing patient identified problems/goals with staff:  Yes Medical problems stabilized or resolved:  Yes Denies suicidal/homicidal ideation: Yes Issues/concerns per patient self-inventory:  None identified Other: N/A  New problem(s) identified: None Identified  Additional comments: N/A  Estimated length of stay: Discharge Today  Discharge Plan: Appointments in place with [facility] for med management and therapy 09/10/12 and 08/26/12. Patient will return to live with family.  New goal(s): N/A  Review of initial/current patient goals per problem list:   1.  Goal(s): Reduce depressive symptoms  Met:  Yes  Target date: by discharge  As evidenced by: Reducing depression from a 10 to a 2 as reported by pt.   2.  Goal (s): Eliminate Suicidal Ideation  Met:  Yes  Target date: by discharge  As evidenced by: Eliminate suicidal ideation.   3.  Goal(s): Reduce Psychosis  Met:  Yes  As evidenced by: "Voices almost gone, just a mumble. Best it's been."    Attendees: Patient:  Jackie Odonnell  08/16/2012 11:15 AM   Family:     Physician:  Franchot Gallo, MD 08/16/2012 11:14 AM  Nursing:      Case Manager:   08/16/2012 11:14 AM  Counselor:  Veto Kemps, MT-BC 08/16/2012 11:14 AM  Other: Gwenyth Bouillon RN 08/16/2012 11:14 AM  Other:  Joslyn Devon RN 08/16/2012 11:17 AM   Other:     Other:      Scribe for Treatment Team:   Barrie Folk 08/16/2012 11:14 AM

## 2012-08-16 NOTE — Progress Notes (Signed)
Psychoeducational Group Note  Date:  08/16/2012 Time:  1100  Group Topic/Focus:  Self Care:   The focus of this group is to help patients understand the importance of self-care in order to improve or restore emotional, physical, spiritual, interpersonal, and financial health.  Participation Level:  Active  Participation Quality:  Appropriate and Attentive  Affect:  Appropriate  Cognitive:  Appropriate  Insight:  Good  Engagement in Group:  Good  Additional Comments:  Pt was able to share good positive insight with the group this morning.  Darcel Frane E 08/16/2012, 12:29 PM

## 2012-08-16 NOTE — Progress Notes (Signed)
Patient is anticipating discharge home today.  She feels she is ready and will be discharged to her apartment; her sister will be picking her up around 2:30 this afternoon.  She denies any SI/HI/AVH.  Patient has been attending groups and attending her treatment.    Continue to monitor medication management and MD orders.  Collaborate care with treatment team members and POC regarding patient.  Patient is calm and cooperative; her behavior is appropriate on the unit.  She is interacting well with others.

## 2012-08-16 NOTE — Care Management (Signed)
Referral to [facility] faxed to [phone] requesting appointment for patient to be seen for primary care needs. [facility] will contact patient directly with appointment time and date. Joice Lofts RN MS EdS 08/16/2012  12:59 PM

## 2012-08-16 NOTE — Progress Notes (Signed)
[facility] Case Management Discharge Plan:  Will you be returning to the same living situation after discharge: Yes,  apartment At discharge, do you have transportation home?:Yes,  sister Do you have the ability to pay for your medications:Assistance through [employer] funds via [employer].  Interagency Information:     Release of information consent forms completed and in the chart;  Patient's signature needed at discharge.  Patient to Follow up at:  Follow-up Information    Follow up with [facility] on 08/26/2012. (Appt with Breck Coons, LPC at 9:30am.)    Contact information:   [address] [place], [place] [address] [phone] fax [phone]      Follow up with [facility] on 09/10/2012. (Appt. with Dr. Georgena Spurling at [facility].)    Contact information:   [address] [place], [place] [address] [phone] fax [phone]         Patient denies SI/HI:   No.    Safety Planning and Suicide Prevention discussed:  Yes,  Verbalized safety plan.  Barrier to discharge identified:No.  Summary and Recommendations: Able to verbalize understanding safety plan.   Jackie Odonnell 08/16/2012, 12:48 PM

## 2012-08-16 NOTE — Progress Notes (Signed)
08/16/2012      Time: 0930      Group Topic/Focus: The focus of this group is on discussing various styles of communication and communicating assertively using 'I' (feeling) statements.  Participation Level: Active  Participation Quality: Appropriate  Affect: Blunted  Cognitive: Alert   Additional Comments: None.  Lonnette Shrode 08/16/2012 1:51 PM

## 2012-08-16 NOTE — Progress Notes (Signed)
[facility] Group Notes:  (Counselor/Nursing/MHT/Case Management/Adjunct)  08/16/2012 2:46 PM  Type of Therapy:  Group Therapy  Participation Level:  Active  Participation Quality:  Appropriate  Affect:  Appropriate  Cognitive:  Oriented  Insight:  Good  Engagement in Group:  Good  Engagement in Therapy:  Good  Modes of Intervention:  Clarification, Education, Problem-solving and Support  Summary of Progress/Problems: Patient talked about her family making her mad because they were telling her what to do. Later she stated she kind of understood because she wasn't doing anything. Knows now that she was depressed. Encouraged her to talk to family about how they went about telling her what to do and what they might could do to be more supportive. Patient stated that her mother has to have it her way and there was no talking to her. Patient plans to be more engaged in her life and get back to taking better care of herself.   HartisAram Beecham 08/16/2012, 2:46 PM

## 2012-08-18 NOTE — Progress Notes (Signed)
Patient Discharge Instructions:  After Visit Summary (AVS):   Faxed to:  08/18/2012 Access to EMR:  08/18/2012 Psychiatric Admission Assessment Note:   Faxed to:  08/18/2012 Access to EMR:  08/18/2012 Suicide Risk Assessment - Discharge Assessment:   Faxed to:  08/18/2012 Access to EMR:  08/18/2012 Faxed/Sent to the Next Level Care provider:  08/18/2012 Next Level Care Provider Has Access to the EMR, 08/18/2012  Faxed to [facility] and [facility] [place], [place]; Dr Alvino Chapel @ [phone] And records provided to [facility] via [facility] access.  Wandra Scot, 08/18/2012, 3:56 PM

## 2012-09-05 NOTE — Discharge Summary (Signed)
Physician Discharge Summary Note  Patient:  Jackie Odonnell is an 51 y.o., female MRN:  [id number] DOB:  [birth year] Patient phone:  [phone] (home)  Patient address:   [address] [place] [address]   Date of Admission:  08/07/2012 Date of Discharge: 08/16/2012  Discharge Diagnoses: Principal Problem:  *Schizophrenia, paranoid  Axis Diagnosis:  AXIS I: Schizophrenia - Paranoid Type.  AXIS II: Deferred  AXIS III: 1. Hypertension.  AXIS IV: Chronic Mental Illness. Non-compliance with medications.  AXIS V: GAF at time of admission approximately 35. GAF at time of discharge approximately 50.   Level of Care:   Inpatient hospitalization.   Reason for Admission: This very pleasant woman presented to the [facility] for medical clearance prior to being accepted to [facility] and was found to be severely hyponatremic and hypokalemic. She was admitted to IM for correction. Once she was stabilized she was transferred to [facility] for further care and treatment. She has a history of schizophrenia, paranoid type and has been off of her medication for 2 days.  Hospital Course:   The patient attended treatment team meeting this am and met with treatment team members. The patient's symptoms, treatment plan and response to treatment was discussed. The patient endorsed that their symptoms have improved. The patient also stated that they felt stable for discharge.  They reported that from this hospital stay they had learned many coping skills.  In other to maintain their psychiatric stability, they will continue psychiatric care on an outpatient basis. They will follow-up as outlined below.  In addition they were instructed  to take all your medications as prescribed by their mental healthcare provider and to report any adverse effects and or reactions from your medicines to their outpatient provider promptly.  The patient is also instructed and cautioned to not engage in alcohol and or illegal drug use  while on prescription medicines.  In the event of worsening symptoms the patient is instructed to call the crisis hotline, 911 and or go to the nearest ED for appropriate evaluation and treatment of symptoms.   Also while a patient in this hospital, the patient received medication management for his psychiatric symptoms. They were ordered and received as outlined below:    Medication List     As of 09/05/2012 10:31 PM    STOP taking these medications         nicotine 21 mg/24hr patch   Commonly known as: NICODERM CQ - dosed in mg/24 hours      TAKE these medications      Indication    benztropine 0.5 MG tablet   Commonly known as: COGENTIN   Take 1 tablet (0.5 mg total) by mouth 2 (two) times daily in the am and at bedtime.. For EPS.       gabapentin 100 MG capsule   Commonly known as: NEURONTIN   Take 1 capsule (100 mg total) by mouth 2 (two) times daily in the am and at bedtime.. For anxiety.       haloperidol 10 MG tablet   Commonly known as: HALDOL   Take 1 tablet (10 mg total) by mouth at bedtime. For auditory hallucinations.       haloperidol 2 MG tablet   Commonly known as: HALDOL   Take 1 tablet (2 mg total) by mouth daily with breakfast. For auditory hallucinations.       hydrochlorothiazide 12.5 MG capsule   Commonly known as: MICROZIDE   Take 1 capsule (  12.5 mg total) by mouth daily. For blood pressure control.       Ipratropium-Albuterol 20-100 MCG/ACT Aers respimat   Commonly known as: COMBIVENT   Inhale 1 puff into the lungs every 6 (six) hours as needed for wheezing or shortness of breath (refractory cough).       lamoTRIgine 150 MG tablet   Commonly known as: LAMICTAL   Take 1 tablet (150 mg total) by mouth at bedtime. For mood stabilization.       lisinopril 10 MG tablet   Commonly known as: PRINIVIL,ZESTRIL   Take 1 tablet (10 mg total) by mouth daily. For blood pressure.       metoprolol succinate 100 MG 24 hr tablet   Commonly known as: TOPROL-XL     Take 1 tablet (100 mg total) by mouth daily. Take with or immediately following a meal for blood pressure.       potassium chloride SA 20 MEQ tablet   Commonly known as: K-DUR,KLOR-CON   Take 1 tablet (20 mEq total) by mouth daily. For hypokalemia.        They were also enrolled in group counseling sessions and activities in which they participated actively.       Follow-up Information    Follow up with [facility] on 08/26/2012. (Appt with Breck Coons, LPC at 9:30am.)    Contact information:   [address] [place], [place] [address] [phone] fax [phone]      Follow up with [facility] on 09/10/2012. (Appt. with Dr. Georgena Spurling at [facility].)    Contact information:   [address] [place], [place] [address] [phone] fax [phone]      Follow up with [facility]. (Referral has been faxed to [facility], they will call you with appointment.)         Upon discharge, patient adamantly denies suicidal, homicidal ideations, auditory, visual hallucinations and or delusional thinking. They left [facility] with all personal belongings via personal transportation in no apparent distress.  Consults:  Please see the patient's electronic medical record for more details.   Significant Diagnostic Studies:  Please see the patient's electronic medical record for more details.   Discharge Vitals:   Blood pressure 101/72, pulse 65, temperature 97 F (36.1 C), temperature source Oral, resp. rate 18, height 5' 5.75" (1.67 m), weight 80.287 kg (177 lb), SpO2 100.00%..  Mental Status Exam: Demographic factors:  Low socioeconomic status;Living alone;Unemployed   Current Mental Status Per Nursing Assessment::  On Admission: Self-harm thoughts  At Discharge: Time was spent today discussing with the patient her current symptoms. The patient reports again to sleeping well last night and  reports a good appetite. She reports mild feelings of sadness, anhedonia and depressed mood today as well as mild anxiety symptoms. She adamantly denies any suicidal or homicidal ideations today as well as any visual hallucinations or delusional thinking. The patient reports that her auditory hallucinations are "down to a mumble." She reports they are the best they have been in years. She asked today if she could be discharged to outpatient follow up and this will be ordered.   Current Mental Status Per Physician:  Diagnosis:  Axis I: Schizophrenia - Paranoid Type.  The patient was seen today and reports the following:  ADL's: Intact.  Sleep: The patient reports to sleeping well last night.  Appetite: The patient reports a good appetite.   Mild>(1-10) >Severe  Hopelessness (  1-10): 0  Depression (1-10): 2-3  Anxiety (1-10): 3  Suicidal Ideation: The patient adamantly denies any suicidal ideations today.  Plan: No  Intent: No  Means: No  Homicidal Ideation: The patient adamantly denies any homicidal ideations today.  Plan: No  Intent: No.  Means: No  General Appearance/Behavior: The patient remains friendly and cooperative today with this provider.  Eye Contact: Good.  Speech: Appropriate in rate and volume with no pressuring noted today.  Motor Behavior: wnl.  Level of Consciousness: Alert and Oriented x 3.  Mental Status: Alert and Oriented x 3.  Mood: Mildly depressed.  Affect: Mildly constricted.  Anxiety Level: Mild anxiety reported today.  Thought Process: wnl.  Thought Content: The patient denies any visual hallucinations today and denies any delusional thinking. The patient reports that her auditory hallucinations are "down to a mumble." She reports they are the best they have been in years.  Perception:. wnl.  Judgment: Good.  Insight: Good.  Cognition: Oriented to person, place and time.   Loss Factors:  Financial problems / change in socioeconomic status   Historical  Factors:  Family history of mental illness or substance abuse. History of mental illness.   Risk Reduction Factors:  Good insight into mental illness. Good assess to healthcare.   Continued Clinical Symptoms:  Schizophrenia: Paranoid or undifferentiated type  Previous Psychiatric Diagnoses and Treatments  Medical Diagnoses and Treatments/Surgeries   Discharge Diagnoses:  AXIS I: Schizophrenia - Paranoid Type.  AXIS II: Deferred  AXIS III: 1. Hypertension.  AXIS IV: Chronic Mental Illness. Non-compliance with medications.  AXIS V: GAF at time of admission approximately 35. GAF at time of discharge approximately 50.   Cognitive Features That Contribute To Risk:  None Noted.   Current Medications:  Current Facility-Administered Medications   Medication  Dose  Route  Frequency  Provider  Last Rate  Last Dose   .  acetaminophen (TYLENOL) tablet 650 mg  650 mg  Oral  Q6H PRN  Verne Spurr, PA-C   650 mg at 08/08/12 2117   .  alum & mag hydroxide-simeth (MAALOX/MYLANTA) 200-200-20 MG/5ML suspension 30 mL  30 mL  Oral  Q4H PRN  Verne Spurr, PA-C     .  benztropine (COGENTIN) tablet 0.5 mg  0.5 mg  Oral  BH-qamhs  Curlene Labrum Arjun Hard, MD   0.5 mg at 08/13/12 0748   .  gabapentin (NEURONTIN) capsule 100 mg  100 mg  Oral  BH-qamhs  Errick Salts D Taimur Fier, MD     .  haloperidol (HALDOL) tablet 10 mg  10 mg  Oral  QHS  Curlene Labrum Nile Dorning, MD   10 mg at 08/12/12 2134   .  haloperidol (HALDOL) tablet 2 mg  2 mg  Oral  Q breakfast  Curlene Labrum Kadijah Shamoon, MD   2 mg at 08/13/12 0748   .  lisinopril (PRINIVIL,ZESTRIL) tablet 10 mg  10 mg  Oral  Daily  Curlene Labrum Danaiya Steadman, MD   10 mg at 08/13/12 0748    And   .  hydrochlorothiazide (MICROZIDE) capsule 12.5 mg  12.5 mg  Oral  Daily  Curlene Labrum Carlei Huang, MD   12.5 mg at 08/13/12 0748   .  lamoTRIgine (LAMICTAL) tablet 150 mg  150 mg  Oral  QHS  Curlene Labrum Darcel Frane, MD   150 mg at 08/12/12 2134   .  magnesium hydroxide (MILK OF MAGNESIA) suspension 30 mL  30 mL  Oral  Daily  PRN  Verne Spurr, PA-C     .  metoprolol succinate (TOPROL-XL) 24 hr tablet 100 mg  100 mg  Oral  Daily  Curlene Labrum Mead Slane, MD   100 mg at 08/13/12 0748   .  nicotine (NICODERM CQ - dosed in mg/24 hours) patch 21 mg  21 mg  Transdermal  Q0600  Curlene Labrum Sivan Quast, MD   21 mg at 08/13/12 1610   .  potassium chloride SA (K-DUR,KLOR-CON) CR tablet 20 mEq  20 mEq  Oral  Daily  Curlene Labrum Granville Whitefield, MD   20 mEq at 08/13/12 0748   .  traZODone (DESYREL) tablet 50 mg  50 mg  Oral  QHS PRN,MR X 1  Verne Spurr, PA-C   50 mg at 08/07/12 2227   .  DISCONTD: gabapentin (NEURONTIN) capsule 100 mg  100 mg  Oral  [id number]  Curlene Labrum Cylus Douville, MD   100 mg at 08/13/12 1358    Lab Results: No results found for this or any previous visit (from the past 48 hour(s)).  Physical Findings:  AIMS: Facial and Oral Movements  Muscles of Facial Expression: None, normal  Lips and Perioral Area: None, normal  Jaw: None, normal  Tongue: None, normal,Extremity Movements  Upper (arms, wrists, hands, fingers): None, normal  Lower (legs, knees, ankles, toes): None, normal, Trunk Movements  Neck, shoulders, hips: None, normal, Overall Severity  Severity of abnormal movements (highest score from questions above): None, normal  Incapacitation due to abnormal movements: None, normal  Patient's awareness of abnormal movements (rate only patient's report): No Awareness, Dental Status  Current problems with teeth and/or dentures?: Yes (missing teeth )  Does patient usually wear dentures?: No  CIWA: CIWA-Ar Total: 2  COWS: COWS Total Score: 1   Review of Systems:  Neurological: The patient denies any headaches today. She denies any seizures or dizziness.  G.I.: The patient denies any constipation today or G.I. Upset.  Musculoskeletal: The patient denies any muscle or skeletal difficulties today.   Time was spent today discussing with the patient her current symptoms. The patient reports again to sleeping well last night and reports  a good appetite. She reports mild feelings of sadness, anhedonia and depressed mood today as well as mild anxiety symptoms. She adamantly denies any suicidal or homicidal ideations today as well as any visual hallucinations or delusional thinking. The patient reports that her auditory hallucinations are "down to a mumble." She reports they are the best they have been in years. She asked today if she could be discharged to outpatient follow up and this will be ordered.   Treatment Plan Summary:  1. Daily contact with patient to assess and evaluate symptoms and progress in treatment.  2. Medication management  3. The patient will deny suicidal ideations or homicidal ideations for 48 hours prior to discharge and have a depression and anxiety rating of 3 or less. The patient will also deny any auditory or visual hallucinations or delusional thinking.  4. The patient will deny any symptoms of substance withdrawal at time of discharge.   Plan:  1. Will continue the medication Haldol 10 mgs po qhs for psychosis.  2. Will continue the medication Haldol at 2 mgs po q am to also address her psychotic symptoms.  3. Will continue the medication Lamictal at 150 mgs po qhs for mood stabilization.  4. Will continue the patient on the medication Neurontin at 100 mgs po q am, 2 pm and hs for anxiety.  5. Will continue the patient on her non-psychiatric medications as listed above.  6. Laboratory studies reviewed.  7. Will continue to monitor.  8. The patient will be discharged today at there request to outpatient follow up.   uicide Risk:  Minimal: No identifiable suicidal ideation. Patients presenting with no risk factors but with morbid ruminations; may be classified as minimal risk based on the severity of the depressive symptoms   Plan Of Care/Follow-up recommendations:  Activity: As tolerated.  Diet: Heart Healthy Diet.  Other: Please take all medications only as directed and keep all scheduled follow up  appointments. Please return to your local Emergency Department should you have any thoughts of harming yourself or others.  Discharge destination:  Home  Is patient on multiple antipsychotic therapies at discharge:  No  Has Patient had three or more failed trials of antipsychotic monotherapy by history: N/A Recommended Plan for Multiple Antipsychotic Therapies: N/A  Discharge Orders    Future Orders Please Complete By Expires   Diet - low sodium heart healthy      Increase activity slowly      Discharge instructions      Comments:   Please take all medications only as directed and keep all scheduled follow up appointments.  Please return to your local Emergency Room if you have any thoughts of hurting yourself or others.       Medication List     As of 09/05/2012 10:31 PM    STOP taking these medications         nicotine 21 mg/24hr patch   Commonly known as: NICODERM CQ - dosed in mg/24 hours      TAKE these medications      Indication    benztropine 0.5 MG tablet   Commonly known as: COGENTIN   Take 1 tablet (0.5 mg total) by mouth 2 (two) times daily in the am and at bedtime.. For EPS.       gabapentin 100 MG capsule   Commonly known as: NEURONTIN   Take 1 capsule (100 mg total) by mouth 2 (two) times daily in the am and at bedtime.. For anxiety.       haloperidol 10 MG tablet   Commonly known as: HALDOL   Take 1 tablet (10 mg total) by mouth at bedtime. For auditory hallucinations.       haloperidol 2 MG tablet   Commonly known as: HALDOL   Take 1 tablet (2 mg total) by mouth daily with breakfast. For auditory hallucinations.       hydrochlorothiazide 12.5 MG capsule   Commonly known as: MICROZIDE   Take 1 capsule (12.5 mg total) by mouth daily. For blood pressure control.       Ipratropium-Albuterol 20-100 MCG/ACT Aers respimat   Commonly known as: COMBIVENT   Inhale 1 puff into the lungs every 6 (six) hours as needed for wheezing or shortness of breath  (refractory cough).       lamoTRIgine 150 MG tablet   Commonly known as: LAMICTAL   Take 1 tablet (150 mg total) by mouth at bedtime. For mood stabilization.       lisinopril 10 MG tablet   Commonly known as: PRINIVIL,ZESTRIL   Take 1 tablet (10 mg total) by mouth daily. For blood pressure.       metoprolol succinate 100 MG 24 hr tablet   Commonly known as: TOPROL-XL   Take 1 tablet (100 mg total) by mouth daily. Take with or immediately following a meal for blood pressure.       potassium chloride SA  20 MEQ tablet   Commonly known as: K-DUR,KLOR-CON   Take 1 tablet (20 mEq total) by mouth daily. For hypokalemia.            Follow-up Information    Follow up with [facility] on 08/26/2012. (Appt with Breck Coons, LPC at 9:30am.)    Contact information:   [address] [place], [place] [address] [phone] fax [phone]      Follow up with [facility] on 09/10/2012. (Appt. with Dr. Georgena Spurling at [facility]    Contact information:   [address] [place], [place] [address] [phone] fax [phone]      Follow up with [facility]. (Referral has been faxed to [facility], they will call you with appointment.)         Follow-up recommendations:   Activities: Resume typical activities Diet: Resume typical diet Other: Follow up with outpatient provider and report any side effects to out patient prescriber.  Comments:  Take all your medications as prescribed by your mental healthcare provider. Report any adverse effects and or reactions from your medicines to your outpatient provider promptly. Patient is instructed and cautioned to not engage in alcohol and or illegal drug use while on prescription medicines. In the event of worsening symptoms, patient is instructed to call the crisis hotline, 911 and or go to the nearest ED for appropriate evaluation and treatment of  symptoms.  Signed: Franchot Gallo 09/05/2012 10:31 PM

## 2014-01-24 ENCOUNTER — Telehealth: Payer: Self-pay | Admitting: Internal Medicine

## 2014-01-24 NOTE — Telephone Encounter (Signed)
No,but she can establish with another md.

## 2014-01-24 NOTE — Telephone Encounter (Signed)
Pt has not seen MD since 2010. Pt would like to re-est. Can I sch?

## 2014-01-25 NOTE — Telephone Encounter (Signed)
Pt is aware.  

## 2014-01-25 NOTE — Telephone Encounter (Signed)
lmom for pt to call back

## 2014-06-08 DIAGNOSIS — F259 Schizoaffective disorder, unspecified: Secondary | ICD-10-CM | POA: Diagnosis not present

## 2014-08-03 DIAGNOSIS — F259 Schizoaffective disorder, unspecified: Secondary | ICD-10-CM | POA: Diagnosis not present

## 2014-09-01 ENCOUNTER — Ambulatory Visit: Payer: Self-pay | Admitting: Internal Medicine

## 2014-10-12 ENCOUNTER — Ambulatory Visit: Payer: Medicaid Other | Admitting: Family

## 2014-11-07 ENCOUNTER — Ambulatory Visit (INDEPENDENT_AMBULATORY_CARE_PROVIDER_SITE_OTHER): Payer: Medicare Other

## 2014-11-07 ENCOUNTER — Encounter: Payer: Self-pay | Admitting: Family

## 2014-11-07 ENCOUNTER — Ambulatory Visit (INDEPENDENT_AMBULATORY_CARE_PROVIDER_SITE_OTHER): Payer: Medicare Other | Admitting: Family

## 2014-11-07 ENCOUNTER — Other Ambulatory Visit (INDEPENDENT_AMBULATORY_CARE_PROVIDER_SITE_OTHER): Payer: Medicare Other

## 2014-11-07 VITALS — BP 142/86 | HR 96 | Temp 98.1°F | Resp 18 | Ht 66.5 in | Wt 187.0 lb

## 2014-11-07 DIAGNOSIS — R053 Chronic cough: Secondary | ICD-10-CM

## 2014-11-07 DIAGNOSIS — Z23 Encounter for immunization: Secondary | ICD-10-CM

## 2014-11-07 DIAGNOSIS — R05 Cough: Secondary | ICD-10-CM

## 2014-11-07 DIAGNOSIS — F200 Paranoid schizophrenia: Secondary | ICD-10-CM | POA: Diagnosis not present

## 2014-11-07 LAB — CBC
HEMATOCRIT: 43.7 % (ref 36.0–46.0)
Hemoglobin: 13.7 g/dL (ref 12.0–15.0)
MCHC: 31.3 g/dL (ref 30.0–36.0)
MCV: 72.4 fl — AB (ref 78.0–100.0)
Platelets: 304 10*3/uL (ref 150.0–400.0)
RBC: 6.03 Mil/uL — AB (ref 3.87–5.11)
RDW: 15.8 % — ABNORMAL HIGH (ref 11.5–15.5)
WBC: 11.9 10*3/uL — ABNORMAL HIGH (ref 4.0–10.5)

## 2014-11-07 MED ORDER — IPRATROPIUM-ALBUTEROL 18-103 MCG/ACT IN AERO: 2.0000 | INHALATION_SPRAY | Freq: Four times a day (QID) | RESPIRATORY_TRACT | Status: DC | PRN

## 2014-11-07 NOTE — Progress Notes (Signed)
   Subjective:    Patient ID: Jackie Odonnell, female    DOB: 02/07/1961, 53 y.o.   MRN: [id number]  Chief Complaint  Patient presents with  . Establish Care    possible COPD    HPI:  Jackie Odonnell is a 53 y.o. female who presents today    1) Schizophrenia - [facility]. Maintained on Buspar, haldol, lamictal, and cogentin. Denies any current positive or negative symptoms.  2) COPD - Was told previously that is she did not stop smoking that she could develop COPD. Recently had a chest x-ray and the peak flow test which pt indicates was borderline for COPD. Denies any current shortness of breath. Coughing up mucus on a regular basis for at least the last year. Has previously been prescribed Combivent but does not use.   No Known Allergies   Current Outpatient Prescriptions  Medication Sig Dispense Refill  . benztropine (COGENTIN) 0.5 MG tablet Take 0.5 mg by mouth 2 (two) times daily.    . busPIRone (BUSPAR) 5 MG tablet Take 5 mg by mouth 2 (two) times daily.    . haloperidol (HALDOL) 5 MG tablet Take 5 mg by mouth 2 (two) times daily.    Marland Kitchen. lamoTRIgine (LAMICTAL) 200 MG tablet Take 200 mg by mouth at bedtime.    Marland Kitchen. lisinopril-hydrochlorothiazide (PRINZIDE,ZESTORETIC) 10-12.5 MG per tablet Take 1 tablet by mouth daily.    . metoprolol (LOPRESSOR) 100 MG tablet Take 100 mg by mouth 2 (two) times daily.    Marland Kitchen. albuterol-ipratropium (COMBIVENT) 18-103 MCG/ACT inhaler Inhale 2 puffs into the lungs every 6 (six) hours as needed for wheezing or shortness of breath. 14.7 g 0   No current facility-administered medications for this visit.   Past Medical History  Diagnosis Date  . Hypertension   . Bipolar affective     Chemical Imbalance    Review of Systems  See HPI    Objective:    BP 142/86 mmHg  Pulse 96  Temp(Src) 98.1 F (36.7 C) (Oral)  Resp 18  Ht 5' 6.5" (1.689 m)  Wt 187 lb (84.823 kg)  BMI 29.73 kg/m2  SpO2 99% Nursing note and  vital signs reviewed.  Physical Exam  Constitutional: She is oriented to person, place, and time. She appears well-developed and well-nourished. No distress.  Middle aged female sitting in the chair, dressed appropriately, appears stated age, occasionally slow to respond to questions, however answers appropriately.  Cardiovascular: Normal rate, regular rhythm, normal heart sounds and intact distal pulses.   Pulmonary/Chest: Effort normal. She has wheezes.  Neurological: She is alert and oriented to person, place, and time.  Skin: Skin is warm and dry.  Psychiatric: She has a normal mood and affect. Her behavior is normal. Judgment and thought content normal.       Assessment & Plan:

## 2014-11-07 NOTE — Assessment & Plan Note (Addendum)
Given smoking history and coughing, consider chronic bronchitis. Start Combivent. Refer to pulmonology for spirometry testing for determination of chronic bronchitis. Discussed use of Combivent and how and when to use.

## 2014-11-07 NOTE — Progress Notes (Signed)
Pre visit review using our clinic review tool, if applicable. No additional management support is needed unless otherwise documented below in the visit note. 

## 2014-11-07 NOTE — Patient Instructions (Addendum)
Thank you for choosing [facility].  Summary/Instructions:  Your prescription(s) have been submitted to your pharmacy. Please take as directed and contact our office if you believe you are having problem(s) with the medication(s).  Please stop by the lab on the basement level of the building for your blood work. Your results will be released to MyChart (or called to you) after review, usually within 72hours after test completion. If any changes need to be made, you will be notified at that same time.  Referrals have been made during this visit. You should expect to hear back from our schedulers in about 7-10 days in regards to establishing an appointment with the specialists we discussed.   If your symptoms worsen or fail to improve, please contact our office for further instruction, or in case of emergency go directly to the emergency room at the closest medical facility.   Chronic Asthmatic Bronchitis Chronic asthmatic bronchitis is a complication of persistent asthma. After a period of time with asthma, some people develop airflow obstruction that is present all the time, even when not having an asthma attack.There is also persistent inflammation of the airways, and the bronchial tubes produce more mucus. Chronic asthmatic bronchitis usually is a permanent problem with the lungs. CAUSES  Chronic asthmatic bronchitis happens most often in people who have asthma and also smoke cigarettes. Occasionally, it can happen to a person with long-standing or severe asthma even if the person is not a smoker. SIGNS AND SYMPTOMS  Chronic asthmatic bronchitis usually causes symptoms of both asthma and chronic bronchitis, including:   Coughing.  Increased sputum production.  Wheezing and shortness of breath.  Chest discomfort.  Recurring infections. DIAGNOSIS  Your health care provider will take a medical history and perform a physical exam. Chronic asthmatic bronchitis is suspected when a  person with asthma has abnormal results on breathing tests (pulmonary function tests) even when breathing symptoms are at their best. Other tests, such as a chest X-ray, may be performed to rule out other conditions.  TREATMENT  Treatment involves controlling symptoms with medicine and lifestyle changes.  Your health care provider may prescribe asthma medicines, including inhaler and nebulizer medicines.  Infection can be treated with medicine to kill germs (antibiotics). Serious infections may require hospitalization. These can include:  Pneumonia.  Sinus infections.  Acute bronchitis.   Preventing infection and hospitalization is very important. Get an influenza vaccination every year as directed by your health care provider. Ask your health care provider whether you need a pneumonia vaccine.  Ask your health care provider whether you would benefit from a pulmonary rehabilitation program. HOME CARE INSTRUCTIONS  Take medicines only as directed by your health care provider.  If you are a cigarette smoker, the most important thing that you can do is quit. Talk to your health care provider for help with quitting smoking.  Avoid pollen, dust, animal dander, molds, smoke, and other things that cause attacks.  Regular exercise is very important to help you feel better. Discuss possible exercise routines with your health care provider.  If animal dander is the cause of asthma, you may not be able to keep pets.  It is important that you:  Become educated about your medical condition.  Participate in maintaining wellness.  Seek medical care as directed. Delay in seeking medical care could cause permanent injury and may be a risk to your life. SEEK MEDICAL CARE IF:  You have wheezing and shortness of breath even if taking medicine to prevent  attacks.  You have muscle aches, chest pain, or thickening of sputum.  Your sputum changes from clear or white to yellow, green, gray, or  bloody. SEEK IMMEDIATE MEDICAL CARE IF:  Your usual medicines do not stop your wheezing.  You have increased coughing or shortness of breath or both.  You have increased difficulty breathing.  You have any problems from the medicine you are taking, such as a rash, itching, swelling, or trouble breathing. MAKE SURE YOU:   Understand these instructions.  Will watch your condition.  Will get help right away if you are not doing well or get worse. Document Released: 09/25/2006 Document Revised: 04/24/2014 Document Reviewed: 01/16/2014 [facility] Patient Information 2015 [employer] [place]. This information is not intended to replace advice given to you by your health care provider. Make sure you discuss any questions you have with your health care provider.

## 2014-11-07 NOTE — Assessment & Plan Note (Signed)
Stable. Continue current lamictal, haldol, benztropine and buspar. Management by psychiatry.

## 2014-11-08 ENCOUNTER — Telehealth: Payer: Self-pay | Admitting: Family

## 2014-11-08 DIAGNOSIS — E876 Hypokalemia: Secondary | ICD-10-CM

## 2014-11-08 DIAGNOSIS — R718 Other abnormality of red blood cells: Secondary | ICD-10-CM

## 2014-11-08 LAB — BASIC METABOLIC PANEL
BUN: 8 mg/dL (ref 6–23)
CHLORIDE: 97 meq/L (ref 96–112)
CO2: 37 meq/L — AB (ref 19–32)
CREATININE: 0.9 mg/dL (ref 0.4–1.2)
Calcium: 9.3 mg/dL (ref 8.4–10.5)
GFR: 89.9 mL/min (ref >60.00)
Glucose, Bld: 115 mg/dL — ABNORMAL HIGH (ref 70–99)
Potassium: 2.5 mEq/L — CL (ref 3.5–5.1)
Sodium: 141 mEq/L (ref 135–145)

## 2014-11-08 MED ORDER — POTASSIUM CHLORIDE CRYS ER 20 MEQ PO TBCR: 40.0000 meq | EXTENDED_RELEASE_TABLET | Freq: Two times a day (BID) | ORAL | Status: DC

## 2014-11-08 NOTE — Telephone Encounter (Signed)
Please call the patient and inform her that her potassium is low and potassium supplements have been sent to her pharmacy. It is important that she picks up the prescription and takes the medication. Please have her follow up on Tuesday or Wednesday next week for blood work. She does not need to make an appointment.

## 2014-11-08 NOTE — Telephone Encounter (Signed)
Called pt to inform her that her potassium is low and to pick up potassium supplements from her pharmacy as soon as she can. She is aware to come back in on Tuesday or Wednesday of next week to get her blood work rechecked.

## 2014-11-09 DIAGNOSIS — F250 Schizoaffective disorder, bipolar type: Secondary | ICD-10-CM | POA: Diagnosis not present

## 2014-11-28 ENCOUNTER — Telehealth: Payer: Self-pay | Admitting: Family

## 2014-11-28 DIAGNOSIS — E876 Hypokalemia: Secondary | ICD-10-CM

## 2014-11-28 MED ORDER — POTASSIUM CHLORIDE CRYS ER 20 MEQ PO TBCR: 40.0000 meq | EXTENDED_RELEASE_TABLET | Freq: Two times a day (BID) | ORAL | Status: DC

## 2014-11-28 MED ORDER — LISINOPRIL-HYDROCHLOROTHIAZIDE 10-12.5 MG PO TABS: 1.0000 | ORAL_TABLET | Freq: Every day | ORAL | Status: DC

## 2014-11-28 NOTE — Telephone Encounter (Signed)
Rx sent 

## 2014-11-28 NOTE — Telephone Encounter (Signed)
Patient is requesting scripts for klor con and lisinopril hydrochlorothiazide to be sent to [facility] [address]

## 2014-12-25 ENCOUNTER — Institutional Professional Consult (permissible substitution): Payer: Self-pay | Admitting: Emergency Medicine

## 2014-12-29 ENCOUNTER — Institutional Professional Consult (permissible substitution): Payer: Self-pay | Admitting: Pulmonary Disease

## 2015-01-08 DIAGNOSIS — F250 Schizoaffective disorder, bipolar type: Secondary | ICD-10-CM | POA: Diagnosis not present

## 2015-01-30 ENCOUNTER — Ambulatory Visit (INDEPENDENT_AMBULATORY_CARE_PROVIDER_SITE_OTHER)
Admission: RE | Admit: 2015-01-30 | Discharge: 2015-01-30 | Disposition: A | Discharge status: Home / Self Care | Payer: Medicare Other | Source: Ambulatory Visit | Attending: Pulmonary Disease | Admitting: Pulmonary Disease

## 2015-01-30 ENCOUNTER — Encounter: Payer: Self-pay | Admitting: Pulmonary Disease

## 2015-01-30 ENCOUNTER — Other Ambulatory Visit (INDEPENDENT_AMBULATORY_CARE_PROVIDER_SITE_OTHER): Payer: Medicare Other

## 2015-01-30 ENCOUNTER — Ambulatory Visit (INDEPENDENT_AMBULATORY_CARE_PROVIDER_SITE_OTHER): Payer: Medicare Other | Admitting: Pulmonary Disease

## 2015-01-30 VITALS — BP 118/78 | HR 94 | Ht 66.5 in | Wt 186.4 lb

## 2015-01-30 DIAGNOSIS — E876 Hypokalemia: Secondary | ICD-10-CM

## 2015-01-30 DIAGNOSIS — Z720 Tobacco use: Secondary | ICD-10-CM | POA: Diagnosis not present

## 2015-01-30 DIAGNOSIS — F17210 Nicotine dependence, cigarettes, uncomplicated: Secondary | ICD-10-CM | POA: Diagnosis not present

## 2015-01-30 DIAGNOSIS — R718 Other abnormality of red blood cells: Secondary | ICD-10-CM | POA: Diagnosis not present

## 2015-01-30 DIAGNOSIS — R053 Chronic cough: Secondary | ICD-10-CM

## 2015-01-30 DIAGNOSIS — R05 Cough: Secondary | ICD-10-CM

## 2015-01-30 DIAGNOSIS — R0989 Other specified symptoms and signs involving the circulatory and respiratory systems: Secondary | ICD-10-CM | POA: Diagnosis not present

## 2015-01-30 LAB — BASIC METABOLIC PANEL
BUN: 5 mg/dL — ABNORMAL LOW (ref 6–23)
CO2: 30 mEq/L (ref 19–32)
Calcium: 9.9 mg/dL (ref 8.4–10.5)
Chloride: 99 mEq/L (ref 96–112)
Creatinine, Ser: 0.87 mg/dL (ref 0.40–1.20)
GFR: 87.45 mL/min (ref >60.00)
Glucose, Bld: 105 mg/dL — ABNORMAL HIGH (ref 70–99)
POTASSIUM: 3 meq/L — AB (ref 3.5–5.1)
Sodium: 137 mEq/L (ref 135–145)

## 2015-01-30 LAB — FERRITIN: FERRITIN: 89.3 ng/mL (ref 10.0–291.0)

## 2015-01-30 LAB — IRON AND TIBC
%SAT: 16 % — AB (ref 20–55)
Iron: 49 ug/dL (ref 42–145)
TIBC: 315 ug/dL (ref 250–470)
UIBC: 266 ug/dL (ref 125–400)

## 2015-01-30 LAB — IRON: Iron: 52 ug/dL (ref 42–145)

## 2015-01-30 NOTE — Assessment & Plan Note (Addendum)
I explained to her today that this is most likely related to her ongoing tobacco use. Alternatively, the lisinopril could be contributing but I would be unwilling to stop that medication until she has quit smoking. Today we perform simple spirometry and showed no evidence of COPD. However, there did appear to be some very mild early airway obstruction which is consistent with small airways disease seen with  active smokers. I explained to her that the best approach at this point is to quit smoking now to prevent progression of airway symptoms and to COPD.   Plan: -Educated to quit smoking - chest x-ray for completeness sake  -If no improvement in cough after quitting smoking then consider changing off of lisinopril -Follow-up as needed

## 2015-01-30 NOTE — Assessment & Plan Note (Signed)
Educated at length to quit smoking today.

## 2015-01-30 NOTE — Progress Notes (Signed)
Subjective:    Patient ID: Jackie Odonnell, female    DOB: 1961-06-24, 54 y.o.   MRN: [id number]  HPI Chief Complaint  Patient presents with  . Advice Only    Referred by Dr. Carver Odonnell; checking for COPD   Jackie Odonnell was told that she had early COPD by another doctor and she was told to see [place] for the same.  She says that she has noticed a cough from time to time, particularly when she is smoking. Sh ehas noticed yellow phlegm production form time to to time.  She has been told that that she wheezes by doctors.  She denies shortness of breath.  She says that she lives an active lifestyle but she never has dyspnea.    She said that she has had PFTs in the office.  She was prescribed an inhaler after a hospital discharge once.  She said that she has never really taken the inhaler.  She continues to cough occasionally when she "pulls on a cigarette" or when she laughs.  She continues to smoke 1 pack per day, though she smoked 2 ppd for a few years.  She has been smoking steadily since age 58.  She said that her smoking increased more when she finished college in her 81's.  She has done clerical work over the years.    Past Medical History  Diagnosis Date  . Hypertension   . Bipolar affective     Chemical Imbalance     No family history on file.   History   Social History  . Marital Status: Single    Spouse Name: N/A    Number of Children: 0  . Years of Education: 15   Occupational History  . Disability     Schizophrenia   Social History Main Topics  . Smoking status: Current Every Day Smoker -- 2.00 packs/day for 15 years    Types: Cigarettes  . Smokeless tobacco: Never Used  . Alcohol Use: No  . Drug Use: No  . Sexual Activity: Not Currently   Other Topics Concern  . Not on file   Social History Narrative   Born and raised in [place], [place]. Currently resides in an apartment by herself. No pets. Fun: Watching tv and walking. Denies religious/spiritual beliefs effecting  health care.      No Known Allergies   Outpatient Prescriptions Prior to Visit  Medication Sig Dispense Refill  . benztropine (COGENTIN) 0.5 MG tablet Take 0.5 mg by mouth 2 (two) times daily.    . busPIRone (BUSPAR) 5 MG tablet Take 5 mg by mouth 2 (two) times daily.    . haloperidol (HALDOL) 5 MG tablet Take 5 mg by mouth 2 (two) times daily.    Marland Kitchen lamoTRIgine (LAMICTAL) 200 MG tablet Take 200 mg by mouth at bedtime.    Marland Kitchen lisinopril-hydrochlorothiazide (PRINZIDE,ZESTORETIC) 10-12.5 MG per tablet Take 1 tablet by mouth daily. 30 tablet 1  . metoprolol (LOPRESSOR) 100 MG tablet Take 100 mg by mouth 2 (two) times daily.    . potassium chloride SA (K-DUR,KLOR-CON) 20 MEQ tablet Take 2 tablets (40 mEq total) by mouth 2 (two) times daily. For 4 days and then one tablet daily. 30 tablet 1  . albuterol-ipratropium (COMBIVENT) 18-103 MCG/ACT inhaler Inhale 2 puffs into the lungs every 6 (six) hours as needed for wheezing or shortness of breath. (Patient not taking: Reported on 01/30/2015) 14.7 g 0   No facility-administered medications prior to visit.  Review of Systems  Constitutional: Negative for fever and unexpected weight change.  HENT: Positive for dental problem and postnasal drip. Negative for congestion, ear pain, nosebleeds, rhinorrhea, sinus pressure, sneezing, sore throat and trouble swallowing.   Eyes: Negative for redness and itching.  Respiratory: Positive for cough and wheezing. Negative for chest tightness and shortness of breath.   Cardiovascular: Negative for palpitations and leg swelling.  Gastrointestinal: Negative for nausea and vomiting.  Genitourinary: Negative for dysuria.  Musculoskeletal: Negative for joint swelling.  Skin: Negative for rash.  Neurological: Negative for headaches.  Hematological: Does not bruise/bleed easily.  Psychiatric/Behavioral: Negative for dysphoric mood. The patient is not nervous/anxious.        Objective:   Physical Exam Filed  Vitals:   01/30/15 1501  BP: 118/78  Pulse: 94  Height: 5' 6.5" (1.689 m)  Weight: 186 lb 6.4 oz (84.55 kg)  SpO2: 100%  RA  Gen: well appearing, no acute distress HEENT: NCAT, PERRL, EOMi, OP clear, neck supple without masses PULM: CTA B CV: RRR, no mgr, no JVD AB: BS+, soft, nontender, no hsm Ext: warm, no edema, no clubbing, no cyanosis Derm: no rash or skin breakdown Neuro: A&Ox4, CN II-XII intact, strength 5/5 in all 4 extremities         Assessment & Plan:   Chronic cough  I explained to her today that this is most likely related to her ongoing tobacco use. Alternatively, the lisinopril could be contributing but I would be unwilling to stop that medication until she has quit smoking. Today we perform simple spirometry and showed no evidence of COPD. However, there did appear to be some very mild early airway obstruction which is consistent with small airways disease seen with  active smokers. I explained to her that the best approach at this point is to quit smoking now to prevent progression of airway symptoms and to COPD.   Plan: -Educated to quit smoking - chest x-ray for completeness sake  -If no improvement in cough after quitting smoking then consider changing off of lisinopril -Follow-up as needed   Tobacco abuse  Educated at length to quit smoking today.     Updated Medication List Outpatient Encounter Prescriptions as of 01/30/2015  Medication Sig  . benztropine (COGENTIN) 0.5 MG tablet Take 0.5 mg by mouth 2 (two) times daily.  . busPIRone (BUSPAR) 5 MG tablet Take 5 mg by mouth 2 (two) times daily.  . haloperidol (HALDOL) 5 MG tablet Take 5 mg by mouth 2 (two) times daily.  Marland Kitchen. lamoTRIgine (LAMICTAL) 200 MG tablet Take 200 mg by mouth at bedtime.  Marland Kitchen. lisinopril-hydrochlorothiazide (PRINZIDE,ZESTORETIC) 10-12.5 MG per tablet Take 1 tablet by mouth daily.  . metoprolol (LOPRESSOR) 100 MG tablet Take 100 mg by mouth 2 (two) times daily.  . potassium chloride  SA (K-DUR,KLOR-CON) 20 MEQ tablet Take 2 tablets (40 mEq total) by mouth 2 (two) times daily. For 4 days and then one tablet daily.  . [DISCONTINUED] albuterol-ipratropium (COMBIVENT) 18-103 MCG/ACT inhaler Inhale 2 puffs into the lungs every 6 (six) hours as needed for wheezing or shortness of breath. (Patient not taking: Reported on 01/30/2015)

## 2015-01-30 NOTE — Patient Instructions (Signed)
Quit smoking! We will call you with the Chest X-ray We will see you back as needed

## 2015-01-31 ENCOUNTER — Telehealth: Payer: Self-pay | Admitting: Family

## 2015-01-31 DIAGNOSIS — E876 Hypokalemia: Secondary | ICD-10-CM

## 2015-01-31 MED ORDER — POTASSIUM CHLORIDE CRYS ER 20 MEQ PO TBCR: 20.0000 meq | EXTENDED_RELEASE_TABLET | Freq: Every day | ORAL | Status: DC

## 2015-01-31 MED ORDER — METOPROLOL TARTRATE 100 MG PO TABS: 100.0000 mg | ORAL_TABLET | Freq: Two times a day (BID) | ORAL | Status: DC

## 2015-01-31 MED ORDER — LISINOPRIL-HYDROCHLOROTHIAZIDE 10-12.5 MG PO TABS: 1.0000 | ORAL_TABLET | Freq: Every day | ORAL | Status: DC

## 2015-01-31 NOTE — Telephone Encounter (Signed)
Please inform the patient that her iron is in the low normal range. I would recommend starting ferrous sulfate which is available over-the-counter to ensure she has good iron stores. I will also refill her potassium and would like her to continue to take 20 mEq daily. We can plan to follow up in 3 months for repeat blood work.

## 2015-01-31 NOTE — Telephone Encounter (Signed)
Left msg on triage requesting call bck. Called pt back she stated she was needing refill on her potassium & BP meds, Gave her md response below potassium has already been sent to [facility]. Will [facility] BP to [facility] as well...Raechel Chute/lmb

## 2015-05-23 ENCOUNTER — Telehealth: Payer: Self-pay | Admitting: Geriatric Medicine

## 2015-05-23 NOTE — Telephone Encounter (Signed)
Patient is going to schedule her mammogram and make sure the results are sent to our office.

## 2015-06-07 DIAGNOSIS — F250 Schizoaffective disorder, bipolar type: Secondary | ICD-10-CM | POA: Diagnosis not present

## 2015-08-02 DIAGNOSIS — F250 Schizoaffective disorder, bipolar type: Secondary | ICD-10-CM | POA: Diagnosis not present

## 2015-08-30 ENCOUNTER — Telehealth: Payer: Self-pay | Admitting: *Deleted

## 2015-08-30 DIAGNOSIS — E876 Hypokalemia: Secondary | ICD-10-CM

## 2015-08-30 MED ORDER — METOPROLOL TARTRATE 100 MG PO TABS: 100.0000 mg | ORAL_TABLET | Freq: Two times a day (BID) | ORAL | Status: DC

## 2015-08-30 MED ORDER — POTASSIUM CHLORIDE CRYS ER 20 MEQ PO TBCR: 20.0000 meq | EXTENDED_RELEASE_TABLET | Freq: Every day | ORAL | Status: DC

## 2015-08-30 MED ORDER — LISINOPRIL-HYDROCHLOROTHIAZIDE 10-12.5 MG PO TABS: 1.0000 | ORAL_TABLET | Freq: Every day | ORAL | Status: DC

## 2015-08-30 NOTE — Telephone Encounter (Signed)
Receive call pt states she need refills on her lisinopril, metoprolol and potassium. She has change pharmacy need rx's to go to [facility]. Sent electronically....Raechel Chute

## 2015-09-04 DIAGNOSIS — Z780 Asymptomatic menopausal state: Secondary | ICD-10-CM | POA: Diagnosis not present

## 2015-09-04 DIAGNOSIS — Z1231 Encounter for screening mammogram for malignant neoplasm of breast: Secondary | ICD-10-CM | POA: Diagnosis not present

## 2015-09-06 DIAGNOSIS — F250 Schizoaffective disorder, bipolar type: Secondary | ICD-10-CM | POA: Diagnosis not present

## 2015-09-26 ENCOUNTER — Encounter: Payer: Self-pay | Admitting: Family

## 2015-10-04 ENCOUNTER — Encounter: Payer: Self-pay | Admitting: Family

## 2015-10-04 ENCOUNTER — Ambulatory Visit (INDEPENDENT_AMBULATORY_CARE_PROVIDER_SITE_OTHER): Payer: Medicare Other | Admitting: Family

## 2015-10-04 ENCOUNTER — Other Ambulatory Visit (INDEPENDENT_AMBULATORY_CARE_PROVIDER_SITE_OTHER): Payer: Medicare Other

## 2015-10-04 VITALS — BP 140/102 | HR 87 | Temp 98.4°F | Resp 18 | Ht 66.5 in | Wt 190.8 lb

## 2015-10-04 DIAGNOSIS — Z0000 Encounter for general adult medical examination without abnormal findings: Secondary | ICD-10-CM

## 2015-10-04 DIAGNOSIS — Z23 Encounter for immunization: Secondary | ICD-10-CM | POA: Diagnosis not present

## 2015-10-04 DIAGNOSIS — F200 Paranoid schizophrenia: Secondary | ICD-10-CM | POA: Diagnosis not present

## 2015-10-04 DIAGNOSIS — I10 Essential (primary) hypertension: Secondary | ICD-10-CM

## 2015-10-04 DIAGNOSIS — Z01419 Encounter for gynecological examination (general) (routine) without abnormal findings: Secondary | ICD-10-CM

## 2015-10-04 LAB — COMPREHENSIVE METABOLIC PANEL
ALT: 27 U/L (ref 0–35)
AST: 14 U/L (ref 0–37)
Albumin: 4.1 g/dL (ref 3.5–5.2)
Alkaline Phosphatase: 116 U/L (ref 39–117)
BILIRUBIN TOTAL: 0.4 mg/dL (ref 0.2–1.2)
BUN: 6 mg/dL (ref 6–23)
CALCIUM: 9.7 mg/dL (ref 8.4–10.5)
CHLORIDE: 101 meq/L (ref 96–112)
CO2: 31 mEq/L (ref 19–32)
CREATININE: 0.89 mg/dL (ref 0.40–1.20)
GFR: 84.97 mL/min (ref >60.00)
GLUCOSE: 95 mg/dL (ref 70–99)
Potassium: 3.3 mEq/L — ABNORMAL LOW (ref 3.5–5.1)
Sodium: 141 mEq/L (ref 135–145)
Total Protein: 7.1 g/dL (ref 6.0–8.3)

## 2015-10-04 LAB — CBC
HCT: 48 % — ABNORMAL HIGH (ref 36.0–46.0)
HEMOGLOBIN: 15.4 g/dL — AB (ref 12.0–15.0)
MCHC: 32 g/dL (ref 30.0–36.0)
MCV: 71.3 fl — AB (ref 78.0–100.0)
PLATELETS: 305 10*3/uL (ref 150.0–400.0)
RBC: 6.72 Mil/uL — ABNORMAL HIGH (ref 3.87–5.11)
RDW: 16.6 % — AB (ref 11.5–15.5)
WBC: 12 10*3/uL — ABNORMAL HIGH (ref 4.0–10.5)

## 2015-10-04 NOTE — Progress Notes (Signed)
Subjective:    Patient ID: Jackie Odonnell, female    DOB: 1961/12/07, 54 y.o.   MRN: [id number]  Chief Complaint  Patient presents with  . Follow-up    wants to know why she has to take the metoprolol twice daily when she is taking 2 BP pills    HPI:  Jackie Odonnell is a 54 y.o. female who  has a past medical history of Hypertension and Bipolar affective (HCC). and presents today for an office follow up.  1.) Hypertension - Currently maintained on metoprolol and lisinopril-HCTZ. Indicates that she is not taking the medications as prescribed secondary to confusion about the medication. She is currently taking lisinopril-hctz once daily and metoprolol once daily. Denies adverse side effects of the medication. Does not currently take her blood pressure at home, but does have it taken at the mental health center. Does drink about 2 L of soda per day. Is due for an eye exam which she is scheduled for.   BP Readings from Last 3 Encounters:  10/04/15 140/102  01/30/15 118/78  11/07/14 142/86    2.) Schizophrenia - Currently managed by psychiatry and on haldol, benztropine, lamotrigine, and buspirone. Takes the medication as prescribed and denies adverse side effects of the medication that she notices. She does express auditory hallucinations but they are not threatening or loud. Denies visual hallucinations. Denies suicidal ideations. Requests a referral to counseling for individualized counseling as she feels the group therapy   No Known Allergies   Current Outpatient Prescriptions on File Prior to Visit  Medication Sig Dispense Refill  . benztropine (COGENTIN) 0.5 MG tablet Take 0.5 mg by mouth 2 (two) times daily.    . busPIRone (BUSPAR) 5 MG tablet Take 5 mg by mouth 2 (two) times daily.    . haloperidol (HALDOL) 5 MG tablet Take 5 mg by mouth 2 (two) times daily.    Marland Kitchen lamoTRIgine (LAMICTAL) 200 MG tablet Take 200 mg by mouth at bedtime.    Marland Kitchen lisinopril-hydrochlorothiazide  (PRINZIDE,ZESTORETIC) 10-12.5 MG per tablet Take 1 tablet by mouth daily. 90 tablet 1  . metoprolol (LOPRESSOR) 100 MG tablet Take 1 tablet (100 mg total) by mouth 2 (two) times daily. 180 tablet 1  . potassium chloride SA (K-DUR,KLOR-CON) 20 MEQ tablet Take 1 tablet (20 mEq total) by mouth daily. 90 tablet 1   No current facility-administered medications on file prior to visit.    Past Surgical History  Procedure Laterality Date  . Abdominal hysterectomy    . Ectopic pregnancy surgery    . Intestines removed      scar tissue issue    Review of Systems  Respiratory: Negative for chest tightness.   Cardiovascular: Negative for chest pain, palpitations and leg swelling.  Psychiatric/Behavioral: Positive for hallucinations. Negative for behavioral problems and decreased concentration.      Objective:    BP 140/102 mmHg  Pulse 87  Temp(Src) 98.4 F (36.9 C) (Oral)  Resp 18  Ht 5' 6.5" (1.689 m)  Wt 190 lb 12.8 oz (86.546 kg)  BMI 30.34 kg/m2  SpO2 99% Nursing note and vital signs reviewed.  Physical Exam  Constitutional: She is oriented to person, place, and time. She appears well-developed and well-nourished. No distress.  Cardiovascular: Normal rate, regular rhythm, normal heart sounds and intact distal pulses.   Pulmonary/Chest: Effort normal and breath sounds normal.  Neurological: She is alert and oriented to person, place, and time.  Skin: Skin is warm and dry.  Psychiatric:  She has a normal mood and affect. Her behavior is normal. Judgment and thought content normal.  Mild lip movement noted.        Assessment & Plan:   Problem List Items Addressed This Visit      Cardiovascular and Mediastinum   Essential hypertension - Primary    Blood pressure is remains above goal of 140/90 with current regimen most likely related to medication non-compliance secondary to misunderstanding of instructions. Continue current dosage of lisinopril-HCTZ and metoprolol. Conisder  metoprolol XL to make regimen more simplistic. Patient just refilled metoprolol and has a 2 month supply remaining. Obtain CMET to check kidney function and electrolytes.         Other   Schizophrenia, paranoid (HCC)    Appears to be adequately controlled with current regimen with mild EPS symptoms noted. Obtain CMET and CBC for therapeutic monitoring of medications. Refer to psychology for counseling. Continue current dosage of halperidol, buspirone, benztropine and lamotrigine with changes per psychiatry. Denies suicidal ideations.        Other Visit Diagnoses    Well woman exam        Relevant Orders    Ambulatory referral to Gynecology

## 2015-10-04 NOTE — Assessment & Plan Note (Signed)
Blood pressure is remains above goal of 140/90 with current regimen most likely related to medication non-compliance secondary to misunderstanding of instructions. Continue current dosage of lisinopril-HCTZ and metoprolol. Conisder metoprolol XL to make regimen more simplistic. Patient just refilled metoprolol and has a 2 month supply remaining. Obtain CMET to check kidney function and electrolytes.

## 2015-10-04 NOTE — Assessment & Plan Note (Signed)
Appears to be adequately controlled with current regimen with mild EPS symptoms noted. Obtain CMET and CBC for therapeutic monitoring of medications. Refer to psychology for counseling. Continue current dosage of halperidol, buspirone, benztropine and lamotrigine with changes per psychiatry. Denies suicidal ideations.

## 2015-10-04 NOTE — Patient Instructions (Addendum)
Thank you for choosing [facility].  Summary/Instructions:   Please stop by the lab on the basement level of the building for your blood work. Your results will be released to MyChart (or called to you) after review, usually within 72 hours after test completion. If any changes need to be made, you will be notified at that same time.  Referrals have been made during this visit. You should expect to hear back from our schedulers in about 7-10 days in regards to establishing an appointment with the specialists we discussed.   If your symptoms worsen or fail to improve, please contact our office for further instruction, or in case of emergency go directly to the emergency room at the closest medical facility.        

## 2015-10-04 NOTE — Progress Notes (Signed)
Pre visit review using our clinic review tool, if applicable. No additional management support is needed unless otherwise documented below in the visit note. 

## 2015-10-07 ENCOUNTER — Telehealth: Payer: Self-pay | Admitting: Family

## 2015-10-07 NOTE — Telephone Encounter (Signed)
Please inform patient that her kidney function, liver function, and electrolytes are within the normal limits. Her red/white blood cells show that her red blood cells are slightly small, however consistent with previous blood work. We will continue to monitor this and recheck in a 3 months.

## 2015-10-08 NOTE — Telephone Encounter (Signed)
Pt states that she takes iron everyday and was wanting to know should she increase to 2 pills a day. Please advise

## 2015-10-08 NOTE — Telephone Encounter (Signed)
Please continue to take her iron at this time and we will recheck in about 3 months.

## 2015-10-09 NOTE — Telephone Encounter (Signed)
Pt aware.

## 2015-10-31 ENCOUNTER — Ambulatory Visit: Payer: Federal, State, Local not specified - Other | Admitting: Licensed Clinical Social Worker

## 2015-11-07 ENCOUNTER — Ambulatory Visit: Payer: Federal, State, Local not specified - Other | Admitting: Licensed Clinical Social Worker

## 2015-11-07 ENCOUNTER — Encounter: Payer: Self-pay | Admitting: Family

## 2016-02-19 DIAGNOSIS — F250 Schizoaffective disorder, bipolar type: Secondary | ICD-10-CM | POA: Diagnosis not present

## 2016-03-05 ENCOUNTER — Ambulatory Visit: Payer: Self-pay | Admitting: Family

## 2016-03-06 ENCOUNTER — Other Ambulatory Visit: Payer: Self-pay | Admitting: Family

## 2016-04-01 ENCOUNTER — Encounter: Payer: Self-pay | Admitting: Family

## 2016-04-01 ENCOUNTER — Other Ambulatory Visit (INDEPENDENT_AMBULATORY_CARE_PROVIDER_SITE_OTHER): Payer: Medicare Other

## 2016-04-01 ENCOUNTER — Ambulatory Visit (INDEPENDENT_AMBULATORY_CARE_PROVIDER_SITE_OTHER): Payer: Medicare Other | Admitting: Family

## 2016-04-01 VITALS — BP 112/78 | HR 80 | Temp 97.9°F | Resp 16 | Ht 66.5 in | Wt 197.0 lb

## 2016-04-01 DIAGNOSIS — R718 Other abnormality of red blood cells: Secondary | ICD-10-CM | POA: Insufficient documentation

## 2016-04-01 DIAGNOSIS — I10 Essential (primary) hypertension: Secondary | ICD-10-CM

## 2016-04-01 LAB — IBC PANEL
IRON: 77 ug/dL (ref 42–145)
Saturation Ratios: 21.3 % (ref 20.0–50.0)
TRANSFERRIN: 258 mg/dL (ref 212.0–360.0)

## 2016-04-01 LAB — CBC
HCT: 45.1 % (ref 36.0–46.0)
HEMOGLOBIN: 14.4 g/dL (ref 12.0–15.0)
MCHC: 32 g/dL (ref 30.0–36.0)
MCV: 73.3 fl — ABNORMAL LOW (ref 78.0–100.0)
PLATELETS: 332 10*3/uL (ref 150.0–400.0)
RBC: 6.15 Mil/uL — AB (ref 3.87–5.11)
RDW: 15.7 % — ABNORMAL HIGH (ref 11.5–15.5)
WBC: 12.4 10*3/uL — AB (ref 4.0–10.5)

## 2016-04-01 LAB — COMPREHENSIVE METABOLIC PANEL
ALBUMIN: 4.3 g/dL (ref 3.5–5.2)
ALK PHOS: 106 U/L (ref 39–117)
ALT: 15 U/L (ref 0–35)
AST: 13 U/L (ref 0–37)
BUN: 7 mg/dL (ref 6–23)
CHLORIDE: 100 meq/L (ref 96–112)
CO2: 29 mEq/L (ref 19–32)
Calcium: 9.9 mg/dL (ref 8.4–10.5)
Creatinine, Ser: 0.9 mg/dL (ref 0.40–1.20)
GFR: 83.72 mL/min (ref >60.00)
Glucose, Bld: 98 mg/dL (ref 70–99)
POTASSIUM: 3.6 meq/L (ref 3.5–5.1)
SODIUM: 137 meq/L (ref 135–145)
TOTAL PROTEIN: 7.1 g/dL (ref 6.0–8.3)
Total Bilirubin: 0.6 mg/dL (ref 0.2–1.2)

## 2016-04-01 LAB — FERRITIN: Ferritin: 135.6 ng/mL (ref 10.0–291.0)

## 2016-04-01 NOTE — Assessment & Plan Note (Signed)
Hypertension is well-controlled and below goal 140/90 with current regimen. No adverse side effects. Blood pressure at home averages below goal 140/90. No symptoms of end organ damage. Continue current dosage of lisinopril-hydrochlorothiazide and metoprolol. Obtain complete metabolic panel to check potassium levels and kidney function.

## 2016-04-01 NOTE — Patient Instructions (Signed)
Thank you for choosing [facility].  Summary/Instructions:  Continue to take your medications as prescribed.    Please stop by the lab on the basement level of the building for your blood work. Your results will be released to MyChart (or called to you) after review, usually within 72 hours after test completion. If any changes need to be made, you will be notified at that same time.

## 2016-04-01 NOTE — Progress Notes (Signed)
Pre visit review using our clinic review tool, if applicable. No additional management support is needed unless otherwise documented below in the visit note. 

## 2016-04-01 NOTE — Assessment & Plan Note (Signed)
Previously noted to have microcytosis and started on iron supplementation. No evidence of anemia. Obtain IBC panel, ferritin, CBC, and hemoglobin electrophoresis with concern for possible thalassemia. No current symptoms. Continue current dosage pending blood work results.

## 2016-04-01 NOTE — Progress Notes (Signed)
 Subjective:    Patient ID: Jackie Odonnell, female    DOB: 01/05/1961, 54 y.o.   MRN: [id number]  Chief Complaint  Patient presents with  . Follow-up    hypertension and iron levels    HPI:  Jackie Odonnell is a 54 y.o. female who  has a past medical history of Hypertension and Bipolar affective (HCC). and presents today for a follow up office visit.   1.) Hypertension - Currently maintained on lisinopril-hydrochlorothiazide and metoprolol. Reports taking medication as prescribed and denies adverse side effects or hypotensive readings. Denies symptoms of end organ damage. Blood pressures have been around the 110/70-80s.   BP Readings from Last 3 Encounters:  04/01/16 112/78  10/04/15 140/102  01/30/15 118/78     2.) Iron levels - previously noted to have microcytosis and slightly decreased iron saturation levels. Denies any symptoms of shortness of breath or fatigue. Currently taking OTC iron supplementation.   Lab Results  Component Value Date   IRON 49 01/30/2015   IRON 52 01/30/2015   TIBC 315 01/30/2015   FERRITIN 89.3 01/30/2015    No Known Allergies   Current Outpatient Prescriptions on File Prior to Visit  Medication Sig Dispense Refill  . benztropine (COGENTIN) 0.5 MG tablet Take 0.5 mg by mouth 2 (two) times daily.    . busPIRone (BUSPAR) 5 MG tablet Take 5 mg by mouth 2 (two) times daily.    . haloperidol (HALDOL) 5 MG tablet Take 5 mg by mouth 2 (two) times daily.    . lamoTRIgine (LAMICTAL) 200 MG tablet Take 200 mg by mouth at bedtime.    . lisinopril-hydrochlorothiazide (PRINZIDE,ZESTORETIC) 10-12.5 MG tablet Take 1 tablet by mouth daily. Keep April appt for future refills 90 tablet 0  . metoprolol (LOPRESSOR) 100 MG tablet Take 1 tablet (100 mg total) by mouth 2 (two) times daily. Keep April appt for future refills 180 tablet 0  . potassium chloride SA (K-DUR,KLOR-CON) 20 MEQ tablet Take 1 tablet (20 mEq total) by mouth daily. 90 tablet 1   No current  facility-administered medications on file prior to visit.      Review of Systems  Constitutional: Negative for fever and chills.  Eyes:       Negative for changes in vision.  Respiratory: Negative for chest tightness.   Cardiovascular: Negative for chest pain, palpitations and leg swelling.  Neurological: Negative for dizziness, weakness and headaches.      Objective:    BP 112/78 mmHg  Pulse 80  Temp(Src) 97.9 F (36.6 C) (Oral)  Resp 16  Ht 5' 6.5" (1.689 m)  Wt 197 lb (89.359 kg)  BMI 31.32 kg/m2  SpO2 92% Nursing note and vital signs reviewed.  Physical Exam  Constitutional: She is oriented to person, place, and time. She appears well-developed and well-nourished. No distress.  Cardiovascular: Normal rate, regular rhythm, normal heart sounds and intact distal pulses.   Pulmonary/Chest: Effort normal and breath sounds normal.  Neurological: She is alert and oriented to person, place, and time.  Skin: Skin is warm and dry.  Psychiatric: She has a normal mood and affect. Her behavior is normal. Judgment and thought content normal.       Assessment & Plan:   Problem List Items Addressed This Visit      Cardiovascular and Mediastinum   Essential hypertension - Primary    Hypertension is well-controlled and below goal 140/90 with current regimen. No adverse side effects. Blood pressure at home averages below goal   140/90. No symptoms of end organ damage. Continue current dosage of lisinopril-hydrochlorothiazide and metoprolol. Obtain complete metabolic panel to check potassium levels and kidney function.      Relevant Orders   Comp Met (CMET)     Other   Microcytosis    Previously noted to have microcytosis and started on iron supplementation. No evidence of anemia. Obtain IBC panel, ferritin, CBC, and hemoglobin electrophoresis with concern for possible thalassemia. No current symptoms. Continue current dosage pending blood work results.      Relevant Orders   CBC     IBC panel   Ferritin   Hemoglobinopathy evaluation       I am having Ms. Delker maintain her haloperidol, busPIRone, lamoTRIgine, benztropine, potassium chloride SA, lisinopril-hydrochlorothiazide, and metoprolol.   Follow-up: Return in about 6 months (around 10/01/2016).  Mauricio Po, FNP

## 2016-04-02 ENCOUNTER — Ambulatory Visit: Payer: Self-pay | Admitting: Family

## 2016-04-03 LAB — HEMOGLOBINOPATHY EVALUATION
HEMOGLOBIN OTHER: 0 %
HGB F QUANT: 0 % (ref 0.0–2.0)
HGB S QUANTITAION: 0 %
Hgb A2 Quant: 2.5 % (ref 2.2–3.2)
Hgb A: 97.5 % (ref 96.8–97.8)

## 2016-04-06 ENCOUNTER — Telehealth: Payer: Self-pay | Admitting: Family

## 2016-04-06 NOTE — Telephone Encounter (Signed)
Please inform patient that her kidney function, electrolytes, and liver function are all within the normal ranges. Her iron levels are normal with no evidence of iron deficiency or anemia at this time. She can plan to follow up in 6 months or sooner if needed.

## 2016-04-07 NOTE — Telephone Encounter (Signed)
Spoke with pt to inform. Pt will call back to make appt.

## 2016-06-02 ENCOUNTER — Other Ambulatory Visit: Payer: Self-pay | Admitting: Family

## 2016-08-07 DIAGNOSIS — F250 Schizoaffective disorder, bipolar type: Secondary | ICD-10-CM | POA: Diagnosis not present

## 2016-09-04 ENCOUNTER — Other Ambulatory Visit: Payer: Self-pay | Admitting: Family

## 2016-10-09 DIAGNOSIS — F250 Schizoaffective disorder, bipolar type: Secondary | ICD-10-CM | POA: Diagnosis not present

## 2016-12-02 ENCOUNTER — Other Ambulatory Visit: Payer: Self-pay | Admitting: Family

## 2016-12-04 ENCOUNTER — Ambulatory Visit: Payer: Self-pay | Admitting: Family

## 2017-03-05 DIAGNOSIS — F250 Schizoaffective disorder, bipolar type: Secondary | ICD-10-CM | POA: Diagnosis not present

## 2017-03-17 ENCOUNTER — Ambulatory Visit: Payer: Self-pay | Admitting: Family

## 2017-03-31 ENCOUNTER — Ambulatory Visit: Payer: Self-pay

## 2017-04-09 ENCOUNTER — Ambulatory Visit (INDEPENDENT_AMBULATORY_CARE_PROVIDER_SITE_OTHER): Payer: Medicare Other | Admitting: Family

## 2017-04-09 ENCOUNTER — Other Ambulatory Visit (INDEPENDENT_AMBULATORY_CARE_PROVIDER_SITE_OTHER): Payer: Medicare Other

## 2017-04-09 ENCOUNTER — Encounter: Payer: Self-pay | Admitting: Family

## 2017-04-09 VITALS — BP 132/86 | HR 91 | Temp 97.9°F | Resp 14 | Ht 66.5 in | Wt 201.0 lb

## 2017-04-09 DIAGNOSIS — E876 Hypokalemia: Secondary | ICD-10-CM | POA: Diagnosis not present

## 2017-04-09 DIAGNOSIS — I10 Essential (primary) hypertension: Secondary | ICD-10-CM | POA: Diagnosis not present

## 2017-04-09 LAB — BASIC METABOLIC PANEL
BUN: 5 mg/dL — AB (ref 6–23)
CALCIUM: 9.3 mg/dL (ref 8.4–10.5)
CO2: 29 meq/L (ref 19–32)
CREATININE: 0.87 mg/dL (ref 0.40–1.20)
Chloride: 105 mEq/L (ref 96–112)
GFR: 86.74 mL/min (ref >60.00)
GLUCOSE: 78 mg/dL (ref 70–99)
Potassium: 3 mEq/L — ABNORMAL LOW (ref 3.5–5.1)
SODIUM: 140 meq/L (ref 135–145)

## 2017-04-09 MED ORDER — METOPROLOL TARTRATE 100 MG PO TABS: ORAL_TABLET | ORAL | 2 refills | Status: DC

## 2017-04-09 MED ORDER — LISINOPRIL-HYDROCHLOROTHIAZIDE 10-12.5 MG PO TABS: 1.0000 | ORAL_TABLET | Freq: Every day | ORAL | 2 refills | Status: DC

## 2017-04-09 MED ORDER — POTASSIUM CHLORIDE CRYS ER 20 MEQ PO TBCR: 20.0000 meq | EXTENDED_RELEASE_TABLET | Freq: Every day | ORAL | 2 refills | Status: DC

## 2017-04-09 NOTE — Patient Instructions (Signed)
Thank you for choosing [facility].  SUMMARY AND INSTRUCTIONS:  Please continue to take your medications as prescribed.  Your blood pressure looks good.  Continue to follow a low sodium diet.   Medication:  Your prescription(s) have been submitted to your pharmacy or been printed and provided for you. Please take as directed and contact our office if you believe you are having problem(s) with the medication(s) or have any questions.  Labs:  Please stop by the lab on the lower level of the building for your blood work. Your results will be released to MyChart (or called to you) after review, usually within 72 hours after test completion. If any changes need to be made, you will be notified at that same time.  1.) The lab is open from 7:30am to 5:30 pm Monday-Friday 2.) No appointment is necessary 3.) Fasting (if needed) is 6-8 hours after food and drink; black coffee and water are okay   Follow up:  If your symptoms worsen or fail to improve, please contact our office for further instruction, or in case of emergency go directly to the emergency room at the closest medical facility.

## 2017-04-09 NOTE — Assessment & Plan Note (Signed)
Blood pressure below goal 140/90 with current regimen and no adverse side effects. Obtain basic metabolic profile. Denies worse headache of life with any symptoms of end organ damage noted on physical exam. Continue current dosage lisinopril-hydrochlorothiazide and metoprolol. Encouraged to monitor blood pressure at home and follow sodium diet.

## 2017-04-09 NOTE — Progress Notes (Signed)
Subjective:    Patient ID: Jackie Odonnell, female    DOB: 01/31/1961, 56 y.o.   MRN: [id number]  Chief Complaint  Patient presents with  . Follow-up    states she needs refills on bp meds and potassium    HPI:  Jackie Odonnell is Odonnell 56 y.o. female who  has Odonnell past medical history of Bipolar affective (HCC) and Hypertension. and presents today for Odonnell follow up office visit.   Hypertension - Currently maintained on lisinopril-hctz and metoprolol. Reports taking the medication as prescribed and denies adverse side effects or hypotensive readings. Denies worst headache of life with no symptoms of end organ damage. Not currently checking blood pressures at home. Working on Odonnell low sodium diet.   BP Readings from Last 3 Encounters:  04/09/17 132/86  04/01/16 112/78  10/04/15 (!) 140/102     No Known Allergies    Outpatient Medications Prior to Visit  Medication Sig Dispense Refill  . benztropine (COGENTIN) 0.5 MG tablet Take 0.5 mg by mouth 2 (two) times daily.    . busPIRone (BUSPAR) 5 MG tablet Take 5 mg by mouth 2 (two) times daily.    . haloperidol (HALDOL) 5 MG tablet Take 5 mg by mouth 2 (two) times daily.    Marland Kitchen lamoTRIgine (LAMICTAL) 200 MG tablet Take 200 mg by mouth at bedtime.    Marland Kitchen lisinopril-hydrochlorothiazide (PRINZIDE,ZESTORETIC) 10-12.5 MG tablet TAKE 1 TABLET BY MOUTH DAILY 90 tablet 2  . metoprolol (LOPRESSOR) 100 MG tablet TAKE 1 TABLET BY MOUTH TWICE DAILY. KEEP APRIL APPT FOR FUTURE REFILLS 180 tablet 0  . potassium chloride SA (K-DUR,KLOR-CON) 20 MEQ tablet TAKE 1 TABLET BY MOUTH ONCE DAILY 90 tablet 2   No facility-administered medications prior to visit.     Review of Systems  Constitutional: Negative for chills and fever.  Eyes:       Negative for changes in vision  Respiratory: Negative for cough, chest tightness and wheezing.   Cardiovascular: Negative for chest pain, palpitations and leg swelling.  Neurological: Negative for dizziness, weakness and  light-headedness.      Objective:    BP 132/86 (BP Location: Left Arm, Patient Position: Sitting, Cuff Size: Large)   Pulse 91   Temp 97.9 F (36.6 C) (Oral)   Resp 14   Ht 5' 6.5" (1.689 m)   Wt 201 lb (91.2 kg)   SpO2 93%   BMI 31.96 kg/m  Nursing note and vital signs reviewed.  Physical Exam  Constitutional: She is oriented to person, place, and time. She appears well-developed and well-nourished. No distress.  Cardiovascular: Normal rate, regular rhythm, normal heart sounds and intact distal pulses.   Pulmonary/Chest: Effort normal and breath sounds normal.  Neurological: She is alert and oriented to person, place, and time.  Skin: Skin is warm and dry.  Psychiatric: She has Odonnell normal mood and affect. Her behavior is normal. Judgment and thought content normal.       Assessment & Plan:   Problem List Items Addressed This Visit      Cardiovascular and Mediastinum   Essential hypertension - Primary    Blood pressure below goal 140/90 with current regimen and no adverse side effects. Obtain basic metabolic profile. Denies worse headache of life with any symptoms of end organ damage noted on physical exam. Continue current dosage lisinopril-hydrochlorothiazide and metoprolol. Encouraged to monitor blood pressure at home and follow sodium diet.      Relevant Medications   lisinopril-hydrochlorothiazide (PRINZIDE,ZESTORETIC) 10-12.5  MG tablet   metoprolol (LOPRESSOR) 100 MG tablet   potassium chloride SA (K-DUR,KLOR-CON) 20 MEQ tablet   Other Relevant Orders   Basic Metabolic Panel (BMET)     Other   Hypokalemia    Obtain BMET. Continue current dosage of potassium pending BMET results.       Relevant Medications   potassium chloride SA (K-DUR,KLOR-CON) 20 MEQ tablet       I have changed Ms. Krise's metoprolol and potassium chloride SA. I am also having her maintain her haloperidol, busPIRone, lamoTRIgine, benztropine, and lisinopril-hydrochlorothiazide.   Meds  ordered this encounter  Medications  . lisinopril-hydrochlorothiazide (PRINZIDE,ZESTORETIC) 10-12.5 MG tablet    Sig: Take 1 tablet by mouth daily.    Dispense:  90 tablet    Refill:  2    Order Specific Question:   Supervising Provider    Answer:   Jackie Odonnell [[id number]]  . metoprolol (LOPRESSOR) 100 MG tablet    Sig: TAKE 1 TABLET BY MOUTH TWICE DAILY.    Dispense:  180 tablet    Refill:  2    Order Specific Question:   Supervising Provider    Answer:   Jackie Odonnell [[id number]]  . potassium chloride SA (K-DUR,KLOR-CON) 20 MEQ tablet    Sig: Take 1 tablet (20 mEq total) by mouth daily.    Dispense:  90 tablet    Refill:  2    Order Specific Question:   Supervising Provider    Answer:   Jackie Odonnell [[id number]]     Follow-up: Return in about 6 months (around 10/09/2017), or if symptoms worsen or fail to improve.  Jackie Luz, FNP

## 2017-04-09 NOTE — Assessment & Plan Note (Signed)
Obtain BMET. Continue current dosage of potassium pending BMET results.  

## 2017-04-14 ENCOUNTER — Ambulatory Visit: Payer: Self-pay

## 2017-04-23 ENCOUNTER — Ambulatory Visit: Payer: Self-pay

## 2017-05-21 ENCOUNTER — Ambulatory Visit: Payer: Self-pay

## 2017-06-08 DIAGNOSIS — F250 Schizoaffective disorder, bipolar type: Secondary | ICD-10-CM | POA: Diagnosis not present

## 2017-06-14 NOTE — Progress Notes (Addendum)
Subjective:   Jackie Odonnell is a 56 y.o. female who presents for an Initial Medicare Annual Wellness Visit.  Review of Systems    No ROS.  Medicare Wellness Visit. Additional risk factors are reflected in the social history.   Cardiac Risk Factors include: advanced age (>70men, >107 women);hypertension Sleep patterns: feels rested on waking, gets up 1 times nightly to void and sleeps 7-9 hours nightly.    Home Safety/Smoke Alarms: Feels safe in home. Smoke alarms in place.  Living environment; residence and Firearm Safety: 1-story house/ trailer, no firearms.Lives alone , no needs for DME, Limited family and friend support Seat Belt Safety/Bike Helmet: Wears seat belt.   Counseling:   Eye Exam- appointment yearly Dental- dentures  Female:   Pap- Last 08/21/10, S/P hysterectomy, OBGYN resource provided per request      Mammo-  Last 09/04/15, Negative - BI-RADS 1      Dexa scan- Last 11/07/15, low bone mass     CCS- Last N/D, referral placed today    Objective:    Today's Vitals   06/16/17 1318 06/16/17 1320  BP: 126/78   Pulse: 78   Resp: 20   SpO2: 98%   Weight: 195 lb (88.5 kg)   Height: 5\' 7"  (1.702 m)   PainSc:  5    Body mass index is 30.54 kg/m.   Current Medications (verified) Outpatient Encounter Prescriptions as of 06/16/2017  Medication Sig  . benztropine (COGENTIN) 0.5 MG tablet Take 0.5 mg by mouth 2 (two) times daily.  . busPIRone (BUSPAR) 5 MG tablet Take 5 mg by mouth 2 (two) times daily.  . Cholecalciferol (VITAMIN D PO) Take 1 tablet by mouth daily.  . haloperidol (HALDOL) 5 MG tablet Take 5 mg by mouth 2 (two) times daily.  Marland Kitchen lamoTRIgine (LAMICTAL) 200 MG tablet Take 200 mg by mouth at bedtime.  Marland Kitchen lisinopril-hydrochlorothiazide (PRINZIDE,ZESTORETIC) 10-12.5 MG tablet Take 1 tablet by mouth daily.  . metoprolol (LOPRESSOR) 100 MG tablet TAKE 1 TABLET BY MOUTH TWICE DAILY.  . Multiple Vitamins-Iron (MULTIVITAMINS WITH IRON) TABS tablet Take 1  tablet by mouth daily.  . Multiple Vitamins-Minerals (MULTIVITAMIN ADULTS PO) Take 1 tablet by mouth 1 day or 1 dose.  . potassium chloride SA (K-DUR,KLOR-CON) 20 MEQ tablet Take 1 tablet (20 mEq total) by mouth daily.   No facility-administered encounter medications on file as of 06/16/2017.     Allergies (verified) Patient has no known allergies.   History: Past Medical History:  Diagnosis Date  . Bipolar affective (HCC)    Chemical Imbalance  . Hypertension    Past Surgical History:  Procedure Laterality Date  . ABDOMINAL HYSTERECTOMY    . ECTOPIC PREGNANCY SURGERY    . intestines removed     scar tissue issue   History reviewed. No pertinent family history. Social History   Occupational History  . Disability     Schizophrenia   Social History Main Topics  . Smoking status: Current Every Day Smoker    Packs/day: 2.00    Years: 15.00    Types: Cigarettes  . Smokeless tobacco: Never Used  . Alcohol use No  . Drug use: No  . Sexual activity: Not Currently    Tobacco Counseling Ready to quit: Not Answered Counseling given: Not Answered   Activities of Daily Living In your present state of health, do you have any difficulty performing the following activities: 06/16/2017 04/09/2017  Hearing? N N  Vision? N N  Difficulty concentrating or  making decisions? N N  Walking or climbing stairs? N N  Dressing or bathing? N N  Doing errands, shopping? N N  Preparing Food and eating ? N -  Using the Toilet? N -  In the past six months, have you accidently leaked urine? N -  Do you have problems with loss of bowel control? N -  Managing your Medications? N -  Managing your Finances? N -  Housekeeping or managing your Housekeeping? N -  Some recent data might be hidden    Immunizations and Health Maintenance Immunization History  Administered Date(s) Administered  . Influenza,inj,Quad PF,36+ Mos 11/07/2014, 10/04/2015  . Td 09/22/1999, 04/25/2009   Health  Maintenance Due  Topic Date Due  . Hepatitis C Screening  Oct 05, 1961  . HIV Screening  08/24/1976  . PAP SMEAR  08/24/1982  . COLONOSCOPY  08/25/2011    Patient Care Team: Veryl Speak, FNP as PCP - General (Family Medicine)  Indicate any recent Medical Services you may have received from other than [facility] providers in the past year (date may be approximate).     Assessment:   This is a routine wellness examination for [place].Physical assessment deferred to PCP.   Hearing/Vision screen Hearing Screening Comments: Able to hear conversational tones w/o difficulty. No issues reported.    Dietary issues and exercise activities discussed: Current Exercise Habits: Home exercise routine, Type of exercise: walking, Time (Minutes): 30, Frequency (Times/Week): 3, Weekly Exercise (Minutes/Week): 90, Intensity: Mild, Exercise limited by: None identified  Diet (meal preparation, eat out, water intake, caffeinated beverages, dairy products, fruits and vegetables): in general, a "healthy" diet  , well balanced, low fat/ cholesterol, low salt  Reviewed heart healthy and high fiber diet, encouraged patient to increase daily water intake. Diet education was provided via handout.   Goals    . <enter goal here>    . [place] to walk 3 times a week and  save my money for a car      Depression Screen PHQ 2/9 Scores 06/16/2017  PHQ - 2 Score 2  PHQ- 9 Score 5    Fall Risk Fall Risk  06/16/2017  Falls in the past year? No    Cognitive Function:       Ad8 score reviewed for issues:  Issues making decisions: no  Less interest in hobbies / activities: no  Repeats questions, stories (family complaining): no  Trouble using ordinary gadgets (microwave, computer, phone):no  Forgets the month or year: no  Mismanaging finances: no  Remembering appts: no  Daily problems with thinking and/or memory: no Ad8 score is= 0    Screening Tests Health Maintenance  Topic Date Due  .  Hepatitis C Screening  09-28-1961  . HIV Screening  08/24/1976  . PAP SMEAR  08/24/1982  . COLONOSCOPY  08/25/2011  . INFLUENZA VACCINE  07/22/2017  . MAMMOGRAM  09/03/2017  . TETANUS/TDAP  04/26/2019      Plan:    Continue doing brain stimulating activities (puzzles, reading, adult coloring books, staying active) to keep memory sharp.   Continue to eat heart healthy diet (full of fruits, vegetables, whole grains, lean protein, water--limit salt, fat, and sugar intake) and increase physical activity as tolerated.  I have personally reviewed and noted the following in the patient's chart:   . Medical and social history . Use of alcohol, tobacco or illicit drugs  . Current medications and supplements . Functional ability and status . Nutritional status . Physical activity . Advanced directives .  List of other physicians . Vitals . Screenings to include cognitive, depression, and falls . Referrals and appointments  In addition, I have reviewed and discussed with patient certain preventive protocols, quality metrics, and best practice recommendations. A written personalized care plan for preventive services as well as general preventive health recommendations were provided to patient.     Wanda PlumpJill A Wine, RN   06/16/2017    Medical screening examination/treatment/procedure(s) were performed by non-physician practitioner and as supervising physician I was immediately available for consultation/collaboration. I agree with above. Pincus SanesStacy J Burns, MD

## 2017-06-14 NOTE — Progress Notes (Signed)
Pre visit review using our clinic review tool, if applicable. No additional management support is needed unless otherwise documented below in the visit note. 

## 2017-06-16 ENCOUNTER — Ambulatory Visit (INDEPENDENT_AMBULATORY_CARE_PROVIDER_SITE_OTHER): Payer: Medicare Other | Admitting: *Deleted

## 2017-06-16 VITALS — BP 126/78 | HR 78 | Resp 20 | Ht 67.0 in | Wt 195.0 lb

## 2017-06-16 DIAGNOSIS — Z1231 Encounter for screening mammogram for malignant neoplasm of breast: Secondary | ICD-10-CM

## 2017-06-16 DIAGNOSIS — Z0000 Encounter for general adult medical examination without abnormal findings: Secondary | ICD-10-CM | POA: Diagnosis not present

## 2017-06-16 DIAGNOSIS — Z1211 Encounter for screening for malignant neoplasm of colon: Secondary | ICD-10-CM

## 2017-06-16 DIAGNOSIS — Z1239 Encounter for other screening for malignant neoplasm of breast: Secondary | ICD-10-CM

## 2017-06-16 NOTE — Patient Instructions (Addendum)
Continue doing brain stimulating activities (puzzles, reading, adult coloring books, staying active) to keep memory sharp.   Continue to eat heart healthy diet (full of fruits, vegetables, whole grains, lean protein, water--limit salt, fat, and sugar intake) and increase physical activity as tolerated.   [facility] [place] Address: [address], [place], [place] [address] Hours: Open ? Closes 5PM Phone: [phone] High-Fiber Diet Fiber, also called dietary fiber, is a type of carbohydrate found in fruits, vegetables, whole grains, and beans. A high-fiber diet can have many health benefits. Your health care provider may recommend a high-fiber diet to help:  Prevent constipation. Fiber can make your bowel movements more regular.  Lower your cholesterol.  Relieve hemorrhoids, uncomplicated diverticulosis, or irritable bowel syndrome.  Prevent overeating as part of a weight-loss plan.  Prevent heart disease, type 2 diabetes, and certain cancers.  What is my plan? The recommended daily intake of fiber includes:  38 grams for men under age 56.  30 grams for men over age 56.  25 grams for women under age 56.  21 grams for women over age 350.  You can get the recommended daily intake of dietary fiber by eating a variety of fruits, vegetables, grains, and beans. Your health care provider may also recommend a fiber supplement if it is not possible to get enough fiber through your diet. What do I need to know about a high-fiber diet?  Fiber supplements have not been widely studied for their effectiveness, so it is better to get fiber through food sources.  Always check the fiber content on thenutrition facts label of any prepackaged food. Look for foods that contain at least 5 grams of fiber per serving.  Ask your dietitian if you have questions about specific foods that are related to your condition, especially if those  foods are not listed in the following section.  Increase your daily fiber consumption gradually. Increasing your intake of dietary fiber too quickly may cause bloating, cramping, or gas.  Drink plenty of water. Water helps you to digest fiber. What foods can I eat? Grains Whole-grain breads. Multigrain cereal. Oats and oatmeal. Brown rice. Barley. Bulgur wheat. Millet. Bran muffins. Popcorn. Rye wafer crackers. Vegetables Sweet potatoes. Spinach. Kale. Artichokes. Cabbage. Broccoli. Green peas. Carrots. Squash. Fruits Berries. Pears. Apples. Oranges. Avocados. Prunes and raisins. Dried figs. Meats and Other Protein Sources Navy, kidney, pinto, and soy beans. Split peas. Lentils. Nuts and seeds. Dairy Fiber-fortified yogurt. Beverages Fiber-fortified soy milk. Fiber-fortified orange juice. Other Fiber bars. The items listed above may not be a complete list of recommended foods or beverages. Contact your dietitian for more options. What foods are not recommended? Grains White bread. Pasta made with refined flour. White rice. Vegetables Fried potatoes. Canned vegetables. Well-cooked vegetables. Fruits Fruit juice. Cooked, strained fruit. Meats and Other Protein Sources Fatty cuts of meat. Fried Environmental education officerpoultry or fried fish. Dairy Milk. Yogurt. Cream cheese. Sour cream. Beverages Soft drinks. Other Cakes and pastries. Butter and oils. The items listed above may not be a complete list of foods and beverages to avoid. Contact your dietitian for more information. What are some tips for including high-fiber foods in my diet?  Eat a wide variety of high-fiber foods.  Make sure that half of all grains consumed each day are whole grains.  Replace breads and cereals made from refined flour or white flour with whole-grain breads and cereals.  Replace white rice with brown rice, bulgur wheat, or  millet.  Start the day with a breakfast that is high in fiber, such as a cereal that contains at  least 5 grams of fiber per serving.  Use beans in place of meat in soups, salads, or pasta.  Eat high-fiber snacks, such as berries, raw vegetables, nuts, or popcorn. This information is not intended to replace advice given to you by your health care provider. Make sure you discuss any questions you have with your health care provider. Document Released: 12/08/2005 Document Revised: 05/15/2016 Document Reviewed: 05/23/2014 Elsevier Interactive Patient Education  2017 [employer].

## 2017-08-18 ENCOUNTER — Encounter: Payer: Self-pay | Admitting: Family

## 2017-10-14 DIAGNOSIS — Z1231 Encounter for screening mammogram for malignant neoplasm of breast: Secondary | ICD-10-CM | POA: Diagnosis not present

## 2017-10-14 DIAGNOSIS — Z124 Encounter for screening for malignant neoplasm of cervix: Secondary | ICD-10-CM | POA: Diagnosis not present

## 2017-10-14 DIAGNOSIS — Z01419 Encounter for gynecological examination (general) (routine) without abnormal findings: Secondary | ICD-10-CM | POA: Diagnosis not present

## 2017-10-22 DIAGNOSIS — F250 Schizoaffective disorder, bipolar type: Secondary | ICD-10-CM | POA: Diagnosis not present

## 2018-02-04 DIAGNOSIS — F250 Schizoaffective disorder, bipolar type: Secondary | ICD-10-CM | POA: Diagnosis not present

## 2018-03-08 ENCOUNTER — Telehealth: Payer: Self-pay | Admitting: Internal Medicine

## 2018-03-08 NOTE — Telephone Encounter (Signed)
Patient is asking to establish care with you. He was a previous patient of Greg Calone's. Please advise.

## 2018-03-08 NOTE — Telephone Encounter (Signed)
Copied from CRM [phone]. Topic: Appointment Scheduling - Prior Auth Required for Appointment >> Mar 08, 2018  2:54 PM Jackie Odonnell, Jackie Odonnell wrote: No appointment has been scheduled. Patient is requesting transition of care appt with [name] Per scheduling protocol, this appointment requires a prior authorization prior to scheduling. Will provider take pt on as np?   Route to department's PEC pool.

## 2018-03-09 NOTE — Telephone Encounter (Signed)
Not taking new patients sorry.  

## 2018-03-10 NOTE — Telephone Encounter (Signed)
Appointment scheduled with [name] to establish care.

## 2018-03-18 ENCOUNTER — Other Ambulatory Visit: Payer: Self-pay | Admitting: *Deleted

## 2018-03-18 DIAGNOSIS — I10 Essential (primary) hypertension: Secondary | ICD-10-CM

## 2018-03-18 DIAGNOSIS — E876 Hypokalemia: Secondary | ICD-10-CM

## 2018-03-18 MED ORDER — METOPROLOL TARTRATE 100 MG PO TABS
ORAL_TABLET | ORAL | 0 refills | Status: DC
Start: 2018-03-18 — End: 2018-06-17

## 2018-03-18 NOTE — Telephone Encounter (Signed)
Rec'd fax pt requesting refill on her Metoprolol 100 mg. Pt has appt set-up to see [name] for 05/14/18. Per office policy sent 30 day to local pharmacy until appt...Raechel Chute/lmb

## 2018-03-19 MED ORDER — LISINOPRIL-HYDROCHLOROTHIAZIDE 10-12.5 MG PO TABS: 1.0000 | ORAL_TABLET | Freq: Every day | ORAL | 0 refills | Status: DC

## 2018-03-19 MED ORDER — POTASSIUM CHLORIDE CRYS ER 20 MEQ PO TBCR
20.0000 meq | EXTENDED_RELEASE_TABLET | Freq: Every day | ORAL | 0 refills | Status: DC
Start: 2018-03-19 — End: 2018-06-17

## 2018-03-19 NOTE — Addendum Note (Signed)
Addended by: Deatra JamesBRAND, LUCY M on: 03/19/2018 10:01 AM   Modules accepted: Orders

## 2018-05-05 DIAGNOSIS — F250 Schizoaffective disorder, bipolar type: Secondary | ICD-10-CM | POA: Diagnosis not present

## 2018-05-14 ENCOUNTER — Telehealth: Payer: Self-pay | Admitting: *Deleted

## 2018-05-14 ENCOUNTER — Other Ambulatory Visit (INDEPENDENT_AMBULATORY_CARE_PROVIDER_SITE_OTHER): Payer: Self-pay

## 2018-05-14 ENCOUNTER — Encounter: Payer: Self-pay | Admitting: Family

## 2018-05-14 ENCOUNTER — Ambulatory Visit (INDEPENDENT_AMBULATORY_CARE_PROVIDER_SITE_OTHER): Payer: Medicare Other | Admitting: Family

## 2018-05-14 VITALS — BP 150/92 | HR 85 | Temp 98.2°F | Ht 67.0 in | Wt 197.1 lb

## 2018-05-14 DIAGNOSIS — I10 Essential (primary) hypertension: Secondary | ICD-10-CM

## 2018-05-14 DIAGNOSIS — Z1211 Encounter for screening for malignant neoplasm of colon: Secondary | ICD-10-CM

## 2018-05-14 DIAGNOSIS — Z1159 Encounter for screening for other viral diseases: Secondary | ICD-10-CM

## 2018-05-14 DIAGNOSIS — Z114 Encounter for screening for human immunodeficiency virus [HIV]: Secondary | ICD-10-CM

## 2018-05-14 DIAGNOSIS — Z13220 Encounter for screening for lipoid disorders: Secondary | ICD-10-CM

## 2018-05-14 DIAGNOSIS — Z113 Encounter for screening for infections with a predominantly sexual mode of transmission: Secondary | ICD-10-CM

## 2018-05-14 DIAGNOSIS — Z720 Tobacco use: Secondary | ICD-10-CM

## 2018-05-14 LAB — COMPREHENSIVE METABOLIC PANEL
ALT: 15 U/L (ref 0–35)
AST: 12 U/L (ref 0–37)
Albumin: 4.3 g/dL (ref 3.5–5.2)
Alkaline Phosphatase: 109 U/L (ref 39–117)
BUN: 10 mg/dL (ref 6–23)
CALCIUM: 10 mg/dL (ref 8.4–10.5)
CHLORIDE: 99 meq/L (ref 96–112)
CO2: 31 meq/L (ref 19–32)
Creatinine, Ser: 0.91 mg/dL (ref 0.40–1.20)
GFR: 82.03 mL/min (ref >60.00)
Glucose, Bld: 99 mg/dL (ref 70–99)
POTASSIUM: 2.8 meq/L — AB (ref 3.5–5.1)
SODIUM: 141 meq/L (ref 135–145)
Total Bilirubin: 0.6 mg/dL (ref 0.2–1.2)
Total Protein: 7 g/dL (ref 6.0–8.3)

## 2018-05-14 LAB — CBC WITH DIFFERENTIAL/PLATELET
BASOS ABS: 0.1 10*3/uL (ref 0.0–0.1)
Basophils Relative: 0.8 % (ref 0.0–3.0)
EOS PCT: 0.3 % (ref 0.0–5.0)
Eosinophils Absolute: 0 10*3/uL (ref 0.0–0.7)
HEMATOCRIT: 43.8 % (ref 36.0–46.0)
Hemoglobin: 14.3 g/dL (ref 12.0–15.0)
LYMPHS PCT: 30.9 % (ref 12.0–46.0)
Lymphs Abs: 2.9 10*3/uL (ref 0.7–4.0)
MCHC: 32.6 g/dL (ref 30.0–36.0)
MCV: 72.5 fl — AB (ref 78.0–100.0)
MONOS PCT: 9.7 % (ref 3.0–12.0)
Monocytes Absolute: 0.9 10*3/uL (ref 0.1–1.0)
NEUTROS PCT: 58.3 % (ref 43.0–77.0)
Neutro Abs: 5.4 10*3/uL (ref 1.4–7.7)
Platelets: 286 10*3/uL (ref 150.0–400.0)
RBC: 6.05 Mil/uL — AB (ref 3.87–5.11)
RDW: 16.5 % — ABNORMAL HIGH (ref 11.5–15.5)
WBC: 9.2 10*3/uL (ref 4.0–10.5)

## 2018-05-14 LAB — LIPID PANEL
CHOL/HDL RATIO: 3
CHOLESTEROL: 135 mg/dL (ref 0–200)
HDL: 45.1 mg/dL (ref >39.00)
LDL CALC: 59 mg/dL (ref 0–99)
NonHDL: 90.07
Triglycerides: 155 mg/dL — ABNORMAL HIGH (ref 0.0–149.0)
VLDL: 31 mg/dL (ref 0.0–40.0)

## 2018-05-14 MED ORDER — POTASSIUM CHLORIDE CRYS ER 20 MEQ PO TBCR: 20.0000 meq | EXTENDED_RELEASE_TABLET | Freq: Two times a day (BID) | ORAL | 0 refills | Status: DC

## 2018-05-14 MED ORDER — LISINOPRIL-HYDROCHLOROTHIAZIDE 20-12.5 MG PO TABS: 1.0000 | ORAL_TABLET | Freq: Every day | ORAL | 0 refills | Status: DC

## 2018-05-14 NOTE — Progress Notes (Addendum)
Jackie Odonnell is a 57 y.o. female with the following history as recorded in EpicCare:  Patient Active Problem List   Diagnosis Date Noted  . Microcytosis 04/01/2016  . Chronic cough 11/07/2014  . Schizophrenia, paranoid ([place]) 08/09/2012  . Tobacco abuse 08/06/2012  . Hyponatremia 08/05/2012  . Hypokalemia 08/05/2012  . Suicidal ideation 08/05/2012  . Essential hypertension 04/25/2009  . ASTHMA 10/18/2007  . PNEUMONIA, HX OF 10/18/2007    Current Outpatient Medications  Medication Sig Dispense Refill  . benztropine (COGENTIN) 0.5 MG tablet Take 0.5 mg by mouth 2 (two) times daily.    . busPIRone (BUSPAR) 10 MG tablet TK 1 T PO TID  2  . busPIRone (BUSPAR) 5 MG tablet Take 5 mg by mouth 2 (two) times daily.    . Cholecalciferol (VITAMIN D PO) Take 1 tablet by mouth daily.    . haloperidol (HALDOL) 10 MG tablet TK 1 T PO BID  2  . lamoTRIgine (LAMICTAL) 200 MG tablet Take 200 mg by mouth at bedtime.    . metoprolol tartrate (LOPRESSOR) 100 MG tablet TAKE 1 TABLET BY MOUTH TWICE DAILY. Must keep scheduled appt for future refills 180 tablet 0  . Multiple Vitamins-Iron (MULTIVITAMINS WITH IRON) TABS tablet Take 1 tablet by mouth daily.    . Multiple Vitamins-Minerals (MULTIVITAMIN ADULTS PO) Take 1 tablet by mouth 1 day or 1 dose.    . potassium chloride SA (K-DUR,KLOR-CON) 20 MEQ tablet Take 1 tablet (20 mEq total) by mouth daily. Must keep appt w/new provider for future refills 90 tablet 0  . lisinopril-hydrochlorothiazide (ZESTORETIC) 20-12.5 MG tablet Take 1 tablet by mouth daily. 90 tablet 0  . potassium chloride SA (K-DUR,KLOR-CON) 20 MEQ tablet Take 1 tablet (20 mEq total) by mouth 2 (two) times daily. 60 tablet 0   No current facility-administered medications for this visit.     Allergies: Patient has no known allergies.  Past Medical History:  Diagnosis Date  . Bipolar affective ([place])    Chemical Imbalance  . Hypertension     Past Surgical History:  Procedure Laterality  Date  . ABDOMINAL HYSTERECTOMY    . ECTOPIC PREGNANCY SURGERY    . intestines removed     scar tissue issue    History reviewed. No pertinent family history.  Social History   Tobacco Use  . Smoking status: Current Every Day Smoker    Packs/day: 2.00    Years: 15.00    Pack years: 30.00    Types: Cigarettes  . Smokeless tobacco: Never Used  Substance Use Topics  . Alcohol use: No    Alcohol/week: 0.0 oz    Subjective:  Patient presents for a follow up on her hypertension; last seen in 2018; notes that psychiatrist has mentioned that blood pressure is not controlled. Denies any chest pain, shortness of breath, blurred vision or headache. Is taking medication as scheduled.  Smokes 2 ppd; knows she needs to quit; not ready at this time- down from 3 ppd;   Agreeable to getting her labs updated; does see GYN yearly for pap smear/ mammogram- due again in October 2018; Discussed need/ benefit of colonoscopy- she is agreeable to referral being done but notes that transportation has been a problem in the past;    Objective:  Vitals:   05/14/18 1315  BP: (!) 150/92  Pulse: 85  Temp: 98.2 F (36.8 C)  TempSrc: Oral  SpO2: 99%  Weight: 197 lb 1.3 oz (89.4 kg)  Height: 5' 7"  (1.702 m)  General: Well developed, well nourished, in no acute distress  Skin : Warm and dry.  Head: Normocephalic and atraumatic  Neck: Supple without thyromegaly, adenopathy  Lungs: Respirations unlabored; clear to auscultation bilaterally without wheeze, rales, rhonchi  CVS exam: normal rate and regular rhythm.  Neurologic: Alert and oriented; speech intact; face symmetrical; moves all extremities well; CNII-XII intact without focal deficit   Assessment:  1. Essential hypertension   2. Lipid screening   3. Screen for STD (sexually transmitted disease)   4. Need for hepatitis C screening test   5. Screening for HIV without presence of risk factors   6. Encounter for screening colonoscopy   7.  Tobacco abuse     Plan:  1. Uncontrolled; increase to Lisinopril HCT 20/12.5 mg daily; follow-up in 1 month, sooner prn. 2. Check lipid panel; Update HIV and Hep C; update order for screening colonoscopy; keep planned appointment with GYN for pap smear/ mammogram. 7. Stressed need to quit smoking- discussed trying stress ball or taking a different walking route so she will not pass the store where she buys cigarettes; patient admits she is not ready to quit completely at this time- uses the cigarettes as a stress reducer; recommended Lung Cancer CT screen- she defers.   Return for AWV with Sharee Pimple.  Orders Placed This Encounter  Procedures  . CBC w/Diff    Standing Status:   Future    Number of Occurrences:   1    Standing Expiration Date:   05/14/2019  . Comp Met (CMET)    Standing Status:   Future    Number of Occurrences:   1    Standing Expiration Date:   05/14/2019  . Lipid panel    Standing Status:   Future    Number of Occurrences:   1    Standing Expiration Date:   05/15/2019  . Hepatitis C Antibody    Standing Status:   Future    Number of Occurrences:   1    Standing Expiration Date:   05/14/2019  . HIV antibody    Standing Status:   Future    Number of Occurrences:   1    Standing Expiration Date:   05/15/2019  . Ambulatory referral to Gastroenterology    Referral Priority:   Routine    Referral Type:   Consultation    Referral Reason:   Specialty Services Required    Number of Visits Requested:   1    Requested Prescriptions   Signed Prescriptions Disp Refills  . lisinopril-hydrochlorothiazide (ZESTORETIC) 20-12.5 MG tablet 90 tablet 0    Sig: Take 1 tablet by mouth daily.

## 2018-05-14 NOTE — Telephone Encounter (Signed)
Pt's Kcl is critical at 2.8. Dr. Posey Rea informed in Laura's absence.   Per Dr. Posey Rea- pt needs to take Kdur 20 mg bid and RTC in 1 month for repeat BMET. New rx sent. Pt informed.

## 2018-05-14 NOTE — Patient Instructions (Signed)
Steps to Quit Smoking Smoking tobacco can be bad for your health. It can also affect almost every organ in your body. Smoking puts you and people around you at risk for many serious long-lasting (chronic) diseases. Quitting smoking is hard, but it is one of the best things that you can do for your health. It is never too late to quit. What are the benefits of quitting smoking? When you quit smoking, you lower your risk for getting serious diseases and conditions. They can include:  Lung cancer or lung disease.  Heart disease.  Stroke.  Heart attack.  Not being able to have children (infertility).  Weak bones (osteoporosis) and broken bones (fractures).  If you have coughing, wheezing, and shortness of breath, those symptoms may get better when you quit. You may also get sick less often. If you are pregnant, quitting smoking can help to lower your chances of having a baby of low birth weight. What can I do to help me quit smoking? Talk with your doctor about what can help you quit smoking. Some things you can do (strategies) include:  Quitting smoking totally, instead of slowly cutting back how much you smoke over a period of time.  Going to in-person counseling. You are more likely to quit if you go to many counseling sessions.  Using resources and support systems, such as: ? Online chats with a counselor. ? Phone quitlines. ? Printed self-help materials. ? Support groups or group counseling. ? Text messaging programs. ? Mobile phone apps or applications.  Taking medicines. Some of these medicines may have nicotine in them. If you are pregnant or breastfeeding, do not take any medicines to quit smoking unless your doctor says it is okay. Talk with your doctor about counseling or other things that can help you.  Talk with your doctor about using more than one strategy at the same time, such as taking medicines while you are also going to in-person counseling. This can help make  quitting easier. What things can I do to make it easier to quit? Quitting smoking might feel very hard at first, but there is a lot that you can do to make it easier. Take these steps:  Talk to your family and friends. Ask them to support and encourage you.  Call phone quitlines, reach out to support groups, or work with a counselor.  Ask people who smoke to not smoke around you.  Avoid places that make you want (trigger) to smoke, such as: ? Bars. ? Parties. ? Smoke-break areas at work.  Spend time with people who do not smoke.  Lower the stress in your life. Stress can make you want to smoke. Try these things to help your stress: ? Getting regular exercise. ? Deep-breathing exercises. ? Yoga. ? Meditating. ? Doing a body scan. To do this, close your eyes, focus on one area of your body at a time from head to toe, and notice which parts of your body are tense. Try to relax the muscles in those areas.  Download or buy apps on your mobile phone or tablet that can help you stick to your quit plan. There are many free apps, such as QuitGuide from the CDC (Centers for Disease Control and Prevention). You can find more support from smokefree.gov and other websites.  This information is not intended to replace advice given to you by your health care provider. Make sure you discuss any questions you have with your health care provider. Document Released: 10/04/2009 Document   Revised: 08/05/2016 Document Reviewed: 04/24/2015 Elsevier Interactive Patient Education  2018 Elsevier Inc.  

## 2018-05-16 LAB — HEPATITIS C ANTIBODY
HEP C AB: NONREACTIVE
SIGNAL TO CUT-OFF: 0.02 (ref <1.00)

## 2018-05-16 LAB — HIV ANTIBODY (ROUTINE TESTING W REFLEX): HIV 1&2 Ab, 4th Generation: NONREACTIVE

## 2018-05-18 ENCOUNTER — Other Ambulatory Visit: Payer: Self-pay | Admitting: Family

## 2018-05-18 DIAGNOSIS — E876 Hypokalemia: Secondary | ICD-10-CM

## 2018-06-17 ENCOUNTER — Other Ambulatory Visit (INDEPENDENT_AMBULATORY_CARE_PROVIDER_SITE_OTHER): Payer: Medicare Other

## 2018-06-17 ENCOUNTER — Ambulatory Visit (INDEPENDENT_AMBULATORY_CARE_PROVIDER_SITE_OTHER): Payer: Medicare Other | Admitting: Family

## 2018-06-17 ENCOUNTER — Encounter: Payer: Self-pay | Admitting: Family

## 2018-06-17 VITALS — BP 138/82 | HR 82 | Temp 98.3°F | Ht 67.0 in | Wt 194.0 lb

## 2018-06-17 DIAGNOSIS — Z720 Tobacco use: Secondary | ICD-10-CM

## 2018-06-17 DIAGNOSIS — E876 Hypokalemia: Secondary | ICD-10-CM

## 2018-06-17 DIAGNOSIS — I10 Essential (primary) hypertension: Secondary | ICD-10-CM

## 2018-06-17 LAB — BASIC METABOLIC PANEL
BUN: 8 mg/dL (ref 6–23)
CO2: 26 mEq/L (ref 19–32)
Calcium: 10.2 mg/dL (ref 8.4–10.5)
Chloride: 95 mEq/L — ABNORMAL LOW (ref 96–112)
Creatinine, Ser: 1 mg/dL (ref 0.40–1.20)
GFR: 73.54 mL/min (ref >60.00)
GLUCOSE: 116 mg/dL — AB (ref 70–99)
POTASSIUM: 3.8 meq/L (ref 3.5–5.1)
SODIUM: 130 meq/L — AB (ref 135–145)

## 2018-06-17 MED ORDER — METOPROLOL TARTRATE 100 MG PO TABS: ORAL_TABLET | ORAL | 1 refills | Status: DC

## 2018-06-17 MED ORDER — LISINOPRIL-HYDROCHLOROTHIAZIDE 20-12.5 MG PO TABS: 1.0000 | ORAL_TABLET | Freq: Every day | ORAL | 1 refills | Status: DC

## 2018-06-17 MED ORDER — POTASSIUM CHLORIDE CRYS ER 20 MEQ PO TBCR: 20.0000 meq | EXTENDED_RELEASE_TABLET | Freq: Every day | ORAL | 1 refills | Status: DC

## 2018-06-17 NOTE — Progress Notes (Signed)
Jackie Odonnell is a 57 y.o. female with the following history as recorded in EpicCare:  Patient Active Problem List   Diagnosis Date Noted  . Microcytosis 04/01/2016  . Chronic cough 11/07/2014  . Schizophrenia, paranoid ([facility]) 08/09/2012  . Tobacco abuse 08/06/2012  . Hyponatremia 08/05/2012  . Hypokalemia 08/05/2012  . Suicidal ideation 08/05/2012  . Essential hypertension 04/25/2009  . ASTHMA 10/18/2007  . PNEUMONIA, HX OF 10/18/2007    Current Outpatient Medications  Medication Sig Dispense Refill  . benztropine (COGENTIN) 0.5 MG tablet Take 0.5 mg by mouth 2 (two) times daily.    . busPIRone (BUSPAR) 10 MG tablet TK 1 T PO TID  2  . busPIRone (BUSPAR) 5 MG tablet Take 5 mg by mouth 2 (two) times daily.    . Cholecalciferol (VITAMIN D PO) Take 1 tablet by mouth daily.    . haloperidol (HALDOL) 10 MG tablet TK 1 T PO BID  2  . lamoTRIgine (LAMICTAL) 200 MG tablet Take 200 mg by mouth at bedtime.    Marland Kitchen lisinopril-hydrochlorothiazide (ZESTORETIC) 20-12.5 MG tablet Take 1 tablet by mouth daily. 90 tablet 1  . metoprolol tartrate (LOPRESSOR) 100 MG tablet TAKE 1 TABLET BY MOUTH TWICE DAILY. 180 tablet 1  . Multiple Vitamins-Iron (MULTIVITAMINS WITH IRON) TABS tablet Take 1 tablet by mouth daily.    . Multiple Vitamins-Minerals (MULTIVITAMIN ADULTS PO) Take 1 tablet by mouth 1 day or 1 dose.    . potassium chloride SA (K-DUR,KLOR-CON) 20 MEQ tablet Take 1 tablet (20 mEq total) by mouth daily. 90 tablet 1   No current facility-administered medications for this visit.     Allergies: Patient has no known allergies.  Past Medical History:  Diagnosis Date  . Bipolar affective ([facility])    Chemical Imbalance  . Hypertension     Past Surgical History:  Procedure Laterality Date  . ABDOMINAL HYSTERECTOMY    . ECTOPIC PREGNANCY SURGERY    . intestines removed     scar tissue issue    History reviewed. No pertinent family history.  Social History   Tobacco Use  . Smoking status:  Current Every Day Smoker    Packs/day: 2.00    Years: 15.00    Pack years: 30.00    Types: Cigarettes  . Smokeless tobacco: Never Used  Substance Use Topics  . Alcohol use: No    Alcohol/week: 0.0 oz    Subjective:  Patient presents to follow-up on hypertension; doing well with recent adjustment; Denies any chest pain, shortness of breath, blurred vision or headache; does need to get potassium re-checked today; knows that needs to quit smoking;    Objective:  Vitals:   06/17/18 1259  BP: 138/82  Pulse: 82  Temp: 98.3 F (36.8 C)  TempSrc: Oral  SpO2: 99%  Weight: 194 lb 0.6 oz (88 kg)  Height: 5\' 7"  (1.702 m)    General: Well developed, well nourished, in no acute distress  Skin : Warm and dry.  Head: Normocephalic and atraumatic  Lungs: Respirations unlabored; clear to auscultation bilaterally without wheeze, rales, rhonchi  CVS exam: normal rate and regular rhythm.  Neurologic: Alert and oriented; speech intact; face symmetrical; moves all extremities well; CNII-XII intact without focal deficit  Assessment:  1. Essential hypertension   2. Hypokalemia   3. Tobacco abuse     Plan:  1. Improved/ stable; Continue same medications; refills updated; follow-up in 6 months, sooner prn. 2. Check BMP today; stressed need to take potassium daily; 3.  Stressed need to quit smoking; order updated Lung Cancer CT today;  Reviewed lipid panel- patient defers more medication today;   Return in about 6 months (around 12/17/2018).  Orders Placed This Encounter  Procedures  . Basic Metabolic Panel (BMET)    Standing Status:   Future    Number of Occurrences:   1    Standing Expiration Date:   06/17/2019  . Ambulatory Referral for Lung Cancer Scre    Referral Priority:   Routine    Referral Type:   Consultation    Referral Reason:   Specialty Services Required    Number of Visits Requested:   1    Requested Prescriptions   Signed Prescriptions Disp Refills  .  lisinopril-hydrochlorothiazide (ZESTORETIC) 20-12.5 MG tablet 90 tablet 1    Sig: Take 1 tablet by mouth daily.  . metoprolol tartrate (LOPRESSOR) 100 MG tablet 180 tablet 1    Sig: TAKE 1 TABLET BY MOUTH TWICE DAILY.  Marland Kitchen. potassium chloride SA (K-DUR,KLOR-CON) 20 MEQ tablet 90 tablet 1    Sig: Take 1 tablet (20 mEq total) by mouth daily.

## 2018-06-21 ENCOUNTER — Other Ambulatory Visit: Payer: Self-pay | Admitting: Acute Care

## 2018-06-21 DIAGNOSIS — Z122 Encounter for screening for malignant neoplasm of respiratory organs: Secondary | ICD-10-CM

## 2018-06-21 DIAGNOSIS — F17210 Nicotine dependence, cigarettes, uncomplicated: Principal | ICD-10-CM

## 2018-06-29 ENCOUNTER — Telehealth: Payer: Self-pay | Admitting: Acute Care

## 2018-06-29 NOTE — Telephone Encounter (Signed)
SD[facility] and CT appt cancelled.  Will close this message and refer to referral notes.

## 2018-07-09 ENCOUNTER — Encounter: Payer: Self-pay | Admitting: Acute Care

## 2018-07-14 ENCOUNTER — Ambulatory Visit: Payer: Self-pay

## 2018-07-15 ENCOUNTER — Ambulatory Visit: Payer: Self-pay

## 2018-07-16 ENCOUNTER — Encounter: Payer: Self-pay | Admitting: Family

## 2018-08-06 ENCOUNTER — Ambulatory Visit: Payer: Self-pay

## 2018-08-10 ENCOUNTER — Ambulatory Visit: Payer: Self-pay

## 2018-08-25 ENCOUNTER — Ambulatory Visit: Payer: Self-pay

## 2018-10-07 DIAGNOSIS — F250 Schizoaffective disorder, bipolar type: Secondary | ICD-10-CM | POA: Diagnosis not present

## 2018-11-12 ENCOUNTER — Ambulatory Visit: Payer: Self-pay

## 2018-11-24 NOTE — Progress Notes (Addendum)
Subjective:   Jackie Odonnell is a 57 y.o. female who presents for Medicare Annual (Subsequent) preventive examination.  Review of Systems:  No ROS.  Medicare Wellness Visit. Additional risk factors are reflected in the social history.  Cardiac Risk Factors include: advanced age (>74men, >58 women);hypertension Sleep patterns: feels rested on waking, gets up 1 times nightly to void and sleeps 8-9 hours nightly.    Home Safety/Smoke Alarms: Feels safe in home. Smoke alarms in place.  Living environment; residence and Firearm Safety: 1-story house/ trailer, no firearms. Lives alone, no needs for DME, good support system Seat Belt Safety/Bike Helmet: Wears seat belt.      Objective:     Vitals: BP 122/80   Pulse 92   Resp 17   Ht 5\' 7"  (1.702 m)   Wt 192 lb (87.1 kg)   SpO2 99%   BMI 30.07 kg/m   Body mass index is 30.07 kg/m.  Advanced Directives 11/25/2018 06/16/2017 08/06/2012  Does Patient Have a Medical Advance Directive? No No Patient does not have advance directive  Would patient like information on creating a medical advance directive? No - Patient declined Yes (ED - Information included in AVS) -  Pre-existing out of facility DNR order (yellow form or pink MOST form) - - No  Some encounter information is confidential and restricted. Go to Review Flowsheets activity to see all data.    Tobacco Social History   Tobacco Use  Smoking Status Current Every Day Smoker  . Packs/day: 2.00  . Years: 15.00  . Pack years: 30.00  . Types: Cigarettes  Smokeless Tobacco Never Used     Ready to quit: No Counseling given: No  Past Medical History:  Diagnosis Date  . Bipolar affective ([facility])    Chemical Imbalance  . Hypertension    Past Surgical History:  Procedure Laterality Date  . ABDOMINAL HYSTERECTOMY    . ECTOPIC PREGNANCY SURGERY    . intestines removed     scar tissue issue   No family history on file. Social History   Socioeconomic History  . Marital  status: Single    Spouse name: Not on file  . Number of children: 0  . Years of education: 71  . Highest education level: Not on file  Occupational History  . Occupation: Disability    Comment: Schizophrenia  Social Needs  . Financial resource strain: Not hard at all  . Food insecurity:    Worry: Never true    Inability: Never true  . Transportation needs:    Medical: No    Non-medical: No  Tobacco Use  . Smoking status: Current Every Day Smoker    Packs/day: 2.00    Years: 15.00    Pack years: 30.00    Types: Cigarettes  . Smokeless tobacco: Never Used  Substance and Sexual Activity  . Alcohol use: No    Alcohol/week: 0.0 standard drinks  . Drug use: No  . Sexual activity: Not Currently  Lifestyle  . Physical activity:    Days per week: 0 days    Minutes per session: 0 min  . Stress: Not at all  Relationships  . Social connections:    Talks on phone: More than three times a week    Gets together: More than three times a week    Attends religious service: 1 to 4 times per year    Active member of club or organization: Yes    Attends meetings of clubs or organizations: More  than 4 times per year    Relationship status: Not on file  Other Topics Concern  . Not on file  Social History Narrative   Born and raised in [place], [place]. Currently resides in an apartment by herself. No pets. Fun: Watching tv and walking. Denies religious/spiritual beliefs effecting health care.     Outpatient Encounter Medications as of 11/25/2018  Medication Sig  . benztropine (COGENTIN) 0.5 MG tablet Take 0.5 mg by mouth 2 (two) times daily.  . busPIRone (BUSPAR) 10 MG tablet TK 1 T PO TID  . busPIRone (BUSPAR) 5 MG tablet Take 5 mg by mouth 2 (two) times daily.  . Cholecalciferol (VITAMIN D PO) Take 1 tablet by mouth daily.  . haloperidol (HALDOL) 10 MG tablet TK 1 T PO BID  . lamoTRIgine (LAMICTAL) 200 MG tablet Take 200 mg by mouth at bedtime.  Marland Kitchen lisinopril-hydrochlorothiazide  (ZESTORETIC) 20-12.5 MG tablet Take 1 tablet by mouth daily.  . metoprolol tartrate (LOPRESSOR) 100 MG tablet TAKE 1 TABLET BY MOUTH TWICE DAILY.  . Multiple Vitamins-Iron (MULTIVITAMINS WITH IRON) TABS tablet Take 1 tablet by mouth daily.  . potassium chloride SA (K-DUR,KLOR-CON) 20 MEQ tablet Take 1 tablet (20 mEq total) by mouth daily.  . [DISCONTINUED] Multiple Vitamins-Minerals (MULTIVITAMIN ADULTS PO) Take 1 tablet by mouth 1 day or 1 dose.   No facility-administered encounter medications on file as of 11/25/2018.     Activities of Daily Living In your present state of health, do you have any difficulty performing the following activities: 11/25/2018  Hearing? N  Vision? N  Difficulty concentrating or making decisions? N  Walking or climbing stairs? N  Dressing or bathing? N  Doing errands, shopping? N  Preparing Food and eating ? N  Using the Toilet? N  In the past six months, have you accidently leaked urine? N  Do you have problems with loss of bowel control? N  Managing your Medications? N  Managing your Finances? N  Housekeeping or managing your Housekeeping? N  Some recent data might be hidden    Patient Care Team: Olive Bass, FNP as PCP - General (Internal Medicine)    Assessment:   This is a routine wellness examination for [place]. Physical assessment deferred to PCP.   Exercise Activities and Dietary recommendations Current Exercise Habits: Home exercise routine, Type of exercise: walking, Time (Minutes): 35, Frequency (Times/Week): 3, Weekly Exercise (Minutes/Week): 105, Intensity: Mild, Exercise limited by: None identified  Diet (meal preparation, eat out, water intake, caffeinated beverages, dairy products, fruits and vegetables): in general, a "healthy" diet  , well balanced   Reviewed heart healthy diet. Encouraged patient to increase daily water and healthy fluid intake.  Goals    . <enter goal here>    . [place] to walk 3 times a week and   save my money for a car    . Patient Stated     I will drink more water and gear up to prepare myself to quit smoking.       Fall Risk Fall Risk  11/25/2018 06/16/2017  Falls in the past year? 0 No   Depression Screen PHQ 2/9 Scores 11/25/2018 06/16/2017  PHQ - 2 Score 2 2  PHQ- 9 Score 3 5     Cognitive Function       Ad8 score reviewed for issues:  Issues making decisions: no  Less interest in hobbies / activities: no  Repeats questions, stories (family complaining): no  Trouble using ordinary gadgets (microwave,  computer, phone):no  Forgets the month or year: no  Mismanaging finances: no  Remembering appts: no  Daily problems with thinking and/or memory: no Ad8 score is= 0  Immunization History  Administered Date(s) Administered  . Influenza,inj,Quad PF,6+ Mos 11/07/2014, 10/04/2015, 11/25/2018  . Td 09/22/1999, 04/25/2009   Screening Tests Health Maintenance  Topic Date Due  . COLONOSCOPY  08/25/2011  . TETANUS/TDAP  04/26/2019  . MAMMOGRAM  09/22/2019  . PAP SMEAR  09/21/2020  . INFLUENZA VACCINE  Completed  . Hepatitis C Screening  Completed  . HIV Screening  Completed      Plan:     Patient states she will schedule colonoscopy screening when she can arrange transportation.   Continue doing brain stimulating activities (puzzles, reading, adult coloring books, staying active) to keep memory sharp.   Continue to eat heart healthy diet (full of fruits, vegetables, whole grains, lean protein, water--limit salt, fat, and sugar intake) and increase physical activity as tolerated.  I have personally reviewed and noted the following in the patient's chart:   . Medical and social history . Use of alcohol, tobacco or illicit drugs  . Current medications and supplements . Functional ability and status . Nutritional status . Physical activity . Advanced directives . List of other physicians . Vitals . Screenings to include cognitive, depression, and  falls . Referrals and appointments  In addition, I have reviewed and discussed with patient certain preventive protocols, quality metrics, and best practice recommendations. A written personalized care plan for preventive services as well as general preventive health recommendations were provided to patient.     Wanda PlumpJill A Whittney Steenson, RN  11/25/2018  Medical screening examination/treatment/procedure(s) were performed by non-physician practitioner and as supervising physician I was immediately available for consultation/collaboration. I agree with above. Jacinta ShoeAleksei Plotnikov, MD

## 2018-11-25 ENCOUNTER — Ambulatory Visit (INDEPENDENT_AMBULATORY_CARE_PROVIDER_SITE_OTHER): Payer: Medicare Other | Admitting: *Deleted

## 2018-11-25 VITALS — BP 122/80 | HR 92 | Resp 17 | Ht 67.0 in | Wt 192.0 lb

## 2018-11-25 DIAGNOSIS — Z0000 Encounter for general adult medical examination without abnormal findings: Secondary | ICD-10-CM

## 2018-11-25 DIAGNOSIS — Z23 Encounter for immunization: Secondary | ICD-10-CM | POA: Diagnosis not present

## 2018-11-25 NOTE — Patient Instructions (Addendum)
Continue doing brain stimulating activities (puzzles, reading, adult coloring books, staying active) to keep memory sharp.   Continue to eat heart healthy diet (full of fruits, vegetables, whole grains, lean protein, water--limit salt, fat, and sugar intake) and increase physical activity as tolerated.   Ms. Boal , Thank you for taking time to come for your Medicare Wellness Visit. I appreciate your ongoing commitment to your health goals. Please review the following plan we discussed and let me know if I can assist you in the future.   These are the goals we discussed: Goals    . <enter goal here>    . [place] to walk 3 times a week and  save my money for a car    . Patient Stated     I will drink more water and gear up to prepare myself to quit smoking.       This is a list of the screening recommended for you and due dates:  Health Maintenance  Topic Date Due  . Colon Cancer Screening  08/25/2011  . Flu Shot  07/22/2018  . Tetanus Vaccine  04/26/2019  . Mammogram  09/22/2019  . Pap Smear  09/21/2020  .  Hepatitis C: One time screening is recommended by Center for Disease Control  (CDC) for  adults born from [birth year] through 1965.   Completed  . HIV Screening  Completed     Influenza Virus Vaccine (Flucelvax) What is this medicine? INFLUENZA VIRUS VACCINE (in floo EN zuh VAHY ruhs vak SEEN) helps to reduce the risk of getting influenza also known as the flu. The vaccine only helps protect you against some strains of the flu. This medicine may be used for other purposes; ask your health care provider or pharmacist if you have questions. COMMON BRAND NAME(S): FLUCELVAX What should I tell my health care provider before I take this medicine? They need to know if you have any of these conditions: -bleeding disorder like hemophilia -fever or infection -Guillain-Barre syndrome or other neurological problems -immune system problems -infection with the human immunodeficiency virus  (HIV) or AIDS -low blood platelet counts -multiple sclerosis -an unusual or allergic reaction to influenza virus vaccine, other medicines, foods, dyes or preservatives -pregnant or trying to get pregnant -breast-feeding How should I use this medicine? This vaccine is for injection into a muscle. It is given by a health care professional. A copy of Vaccine Information Statements will be given before each vaccination. Read this sheet carefully each time. The sheet may change frequently. Talk to your pediatrician regarding the use of this medicine in children. Special care may be needed. Overdosage: If you think you've taken too much of this medicine contact a poison control center or emergency room at once. Overdosage: If you think you have taken too much of this medicine contact a poison control center or emergency room at once. NOTE: This medicine is only for you. Do not share this medicine with others. What if I miss a dose? This does not apply. What may interact with this medicine? -chemotherapy or radiation therapy -medicines that lower your immune system like etanercept, anakinra, infliximab, and adalimumab -medicines that treat or prevent blood clots like warfarin -phenytoin -steroid medicines like prednisone or cortisone -theophylline -vaccines This list may not describe all possible interactions. Give your health care provider a list of all the medicines, herbs, non-prescription drugs, or dietary supplements you use. Also tell them if you smoke, drink alcohol, or use illegal drugs. Some items may interact with  your medicine. What should I watch for while using this medicine? Report any side effects that do not go away within 3 days to your doctor or health care professional. Call your health care provider if any unusual symptoms occur within 6 weeks of receiving this vaccine. You may still catch the flu, but the illness is not usually as bad. You cannot get the flu from the vaccine.  The vaccine will not protect against colds or other illnesses that may cause fever. The vaccine is needed every year. What side effects may I notice from receiving this medicine? Side effects that you should report to your doctor or health care professional as soon as possible: -allergic reactions like skin rash, itching or hives, swelling of the face, lips, or tongue Side effects that usually do not require medical attention (Report these to your doctor or health care professional if they continue or are bothersome.): -fever -headache -muscle aches and pains -pain, tenderness, redness, or swelling at the injection site -tiredness This list may not describe all possible side effects. Call your doctor for medical advice about side effects. You may report side effects to FDA at 1-800-FDA-1088. Where should I keep my medicine? The vaccine will be given by a health care professional in a clinic, pharmacy, doctor's office, or other health care setting. You will not be given vaccine doses to store at home. NOTE: This sheet is a summary. It may not cover all possible information. If you have questions about this medicine, talk to your doctor, pharmacist, or health care provider.  2018 Elsevier/Gold Standard (2011-11-19 14:06:47)

## 2018-12-03 ENCOUNTER — Encounter: Payer: Self-pay | Admitting: Gastroenterology

## 2018-12-19 ENCOUNTER — Other Ambulatory Visit: Payer: Self-pay | Admitting: Family

## 2018-12-19 DIAGNOSIS — I10 Essential (primary) hypertension: Secondary | ICD-10-CM

## 2018-12-19 DIAGNOSIS — E876 Hypokalemia: Secondary | ICD-10-CM

## 2019-01-06 ENCOUNTER — Ambulatory Visit (AMBULATORY_SURGERY_CENTER): Payer: Self-pay

## 2019-01-06 VITALS — Ht 66.0 in | Wt 191.8 lb

## 2019-01-06 DIAGNOSIS — Z1211 Encounter for screening for malignant neoplasm of colon: Secondary | ICD-10-CM

## 2019-01-06 MED ORDER — NA SULFATE-K SULFATE-MG SULF 17.5-3.13-1.6 GM/177ML PO SOLN: 1.0000 | Freq: Once | ORAL | 0 refills | Status: AC

## 2019-01-06 NOTE — Progress Notes (Signed)
Denies allergies to eggs or soy products. Denies complication of anesthesia or sedation. Denies use of weight loss medication. Denies use of O2.   Emmi instructions declined.  A sample of Suprep was given to the patient. Lot # [id number] exp date 11/12/19. Patient verbalizes understanding of instructions.

## 2019-01-12 ENCOUNTER — Encounter: Payer: Self-pay | Admitting: Gastroenterology

## 2019-01-20 ENCOUNTER — Telehealth: Payer: Self-pay | Admitting: Gastroenterology

## 2019-01-20 ENCOUNTER — Encounter: Payer: Medicare Other | Admitting: Gastroenterology

## 2019-01-25 ENCOUNTER — Other Ambulatory Visit: Payer: Self-pay | Admitting: Family

## 2019-02-17 ENCOUNTER — Other Ambulatory Visit: Payer: Self-pay | Admitting: Family

## 2019-02-17 DIAGNOSIS — I10 Essential (primary) hypertension: Secondary | ICD-10-CM

## 2019-02-17 DIAGNOSIS — E876 Hypokalemia: Secondary | ICD-10-CM

## 2019-03-09 ENCOUNTER — Ambulatory Visit: Payer: Self-pay | Admitting: Family

## 2019-03-31 ENCOUNTER — Telehealth: Payer: Self-pay

## 2019-03-31 DIAGNOSIS — F250 Schizoaffective disorder, bipolar type: Secondary | ICD-10-CM | POA: Diagnosis not present

## 2019-03-31 NOTE — Telephone Encounter (Signed)
Spoke with patient today. She was wanting to come into the office today to have her blood pressure checked. Patient had no complaints of headache, blurred vision, nausea , shortness of breath, or any other symptoms. She was advised to visit a minute clinic or urgent care as Vernona Rieger was out of the office today and we were currently offering virtual visits but no seeing patients currently due to COVID-19. She was advised to call me back with her blood pressure readings so I could pass them to [place]. She knows that we will need to get her set up for a visit also. She will have pressure checked and call me back with the readings so I can have Vernona Rieger advise.

## 2019-04-11 NOTE — Telephone Encounter (Signed)
Called and left message to return call back to clinic to follow up and see if she was able to get her blood pressure checked.

## 2019-06-20 ENCOUNTER — Other Ambulatory Visit: Payer: Self-pay | Admitting: Family

## 2019-06-20 DIAGNOSIS — E876 Hypokalemia: Secondary | ICD-10-CM

## 2019-06-20 DIAGNOSIS — I10 Essential (primary) hypertension: Secondary | ICD-10-CM

## 2019-07-29 ENCOUNTER — Ambulatory Visit: Payer: Medicare Other | Admitting: Family

## 2019-09-16 ENCOUNTER — Other Ambulatory Visit: Payer: Self-pay | Admitting: Family

## 2019-09-16 DIAGNOSIS — E876 Hypokalemia: Secondary | ICD-10-CM

## 2019-09-16 DIAGNOSIS — I10 Essential (primary) hypertension: Secondary | ICD-10-CM

## 2019-09-22 ENCOUNTER — Other Ambulatory Visit: Payer: Self-pay | Admitting: Family

## 2019-09-22 DIAGNOSIS — I10 Essential (primary) hypertension: Secondary | ICD-10-CM

## 2019-11-27 ENCOUNTER — Other Ambulatory Visit: Payer: Self-pay | Admitting: Family

## 2019-11-27 DIAGNOSIS — I10 Essential (primary) hypertension: Secondary | ICD-10-CM

## 2019-11-27 DIAGNOSIS — E876 Hypokalemia: Secondary | ICD-10-CM

## 2019-11-30 ENCOUNTER — Other Ambulatory Visit: Payer: Self-pay | Admitting: Family

## 2019-11-30 MED ORDER — LISINOPRIL-HYDROCHLOROTHIAZIDE 20-12.5 MG PO TABS: 1.0000 | ORAL_TABLET | Freq: Every day | ORAL | 0 refills | Status: DC

## 2020-01-02 ENCOUNTER — Inpatient Hospital Stay (HOSPITAL_COMMUNITY): Payer: Medicare Other

## 2020-01-02 ENCOUNTER — Emergency Department (HOSPITAL_COMMUNITY): Payer: Medicare Other

## 2020-01-02 ENCOUNTER — Other Ambulatory Visit: Payer: Self-pay

## 2020-01-02 ENCOUNTER — Encounter (HOSPITAL_COMMUNITY): Payer: Self-pay | Admitting: Family Medicine

## 2020-01-02 ENCOUNTER — Inpatient Hospital Stay (HOSPITAL_COMMUNITY)
Admission: EM | Admit: 2020-01-02 | Discharge: 2020-01-05 | DRG: 641 | Disposition: A | Discharge status: Home Health | Payer: Medicare Other | Attending: Internal Medicine | Admitting: Internal Medicine

## 2020-01-02 DIAGNOSIS — R112 Nausea with vomiting, unspecified: Secondary | ICD-10-CM | POA: Diagnosis not present

## 2020-01-02 DIAGNOSIS — F319 Bipolar disorder, unspecified: Secondary | ICD-10-CM | POA: Diagnosis present

## 2020-01-02 DIAGNOSIS — Z20822 Contact with and (suspected) exposure to covid-19: Secondary | ICD-10-CM | POA: Diagnosis present

## 2020-01-02 DIAGNOSIS — I10 Essential (primary) hypertension: Secondary | ICD-10-CM | POA: Diagnosis not present

## 2020-01-02 DIAGNOSIS — E876 Hypokalemia: Secondary | ICD-10-CM | POA: Diagnosis present

## 2020-01-02 DIAGNOSIS — F200 Paranoid schizophrenia: Secondary | ICD-10-CM | POA: Diagnosis not present

## 2020-01-02 DIAGNOSIS — G9340 Encephalopathy, unspecified: Secondary | ICD-10-CM | POA: Diagnosis not present

## 2020-01-02 DIAGNOSIS — R197 Diarrhea, unspecified: Secondary | ICD-10-CM | POA: Diagnosis present

## 2020-01-02 DIAGNOSIS — R0902 Hypoxemia: Secondary | ICD-10-CM | POA: Diagnosis not present

## 2020-01-02 DIAGNOSIS — E861 Hypovolemia: Secondary | ICD-10-CM | POA: Diagnosis present

## 2020-01-02 DIAGNOSIS — J45909 Unspecified asthma, uncomplicated: Secondary | ICD-10-CM | POA: Diagnosis not present

## 2020-01-02 DIAGNOSIS — R9431 Abnormal electrocardiogram [ECG] [EKG]: Secondary | ICD-10-CM | POA: Diagnosis not present

## 2020-01-02 DIAGNOSIS — J4520 Mild intermittent asthma, uncomplicated: Secondary | ICD-10-CM

## 2020-01-02 DIAGNOSIS — T502X5A Adverse effect of carbonic-anhydrase inhibitors, benzothiadiazides and other diuretics, initial encounter: Secondary | ICD-10-CM | POA: Diagnosis present

## 2020-01-02 DIAGNOSIS — T796XXA Traumatic ischemia of muscle, initial encounter: Secondary | ICD-10-CM | POA: Diagnosis present

## 2020-01-02 DIAGNOSIS — F17210 Nicotine dependence, cigarettes, uncomplicated: Secondary | ICD-10-CM | POA: Diagnosis not present

## 2020-01-02 DIAGNOSIS — E878 Other disorders of electrolyte and fluid balance, not elsewhere classified: Secondary | ICD-10-CM | POA: Diagnosis not present

## 2020-01-02 DIAGNOSIS — Z720 Tobacco use: Secondary | ICD-10-CM | POA: Diagnosis not present

## 2020-01-02 DIAGNOSIS — E860 Dehydration: Secondary | ICD-10-CM | POA: Diagnosis present

## 2020-01-02 DIAGNOSIS — Z79899 Other long term (current) drug therapy: Secondary | ICD-10-CM | POA: Diagnosis not present

## 2020-01-02 DIAGNOSIS — R5381 Other malaise: Secondary | ICD-10-CM | POA: Diagnosis not present

## 2020-01-02 DIAGNOSIS — J410 Simple chronic bronchitis: Secondary | ICD-10-CM | POA: Diagnosis present

## 2020-01-02 DIAGNOSIS — I959 Hypotension, unspecified: Secondary | ICD-10-CM | POA: Diagnosis not present

## 2020-01-02 DIAGNOSIS — E871 Hypo-osmolality and hyponatremia: Secondary | ICD-10-CM | POA: Diagnosis not present

## 2020-01-02 DIAGNOSIS — R42 Dizziness and giddiness: Secondary | ICD-10-CM | POA: Diagnosis not present

## 2020-01-02 LAB — COMPREHENSIVE METABOLIC PANEL
ALT: 49 U/L — ABNORMAL HIGH (ref 0–44)
ALT: 46 U/L — ABNORMAL HIGH (ref 0–44)
AST: 95 U/L — ABNORMAL HIGH (ref 15–41)
AST: 86 U/L — ABNORMAL HIGH (ref 15–41)
Albumin: 3.4 g/dL — ABNORMAL LOW (ref 3.5–5.0)
Albumin: 3.4 g/dL — ABNORMAL LOW (ref 3.5–5.0)
Alkaline Phosphatase: 106 U/L (ref 38–126)
Alkaline Phosphatase: 100 U/L (ref 38–126)
BUN: 7 mg/dL (ref 6–20)
BUN: 8 mg/dL (ref 6–20)
CO2: 35 mmol/L — ABNORMAL HIGH (ref 22–32)
CO2: 38 mmol/L — ABNORMAL HIGH (ref 22–32)
Calcium: 8.3 mg/dL — ABNORMAL LOW (ref 8.9–10.3)
Calcium: 8.3 mg/dL — ABNORMAL LOW (ref 8.9–10.3)
Chloride: <65 mmol/L — CL (ref 98–111)
Chloride: <65 mmol/L — CL (ref 98–111)
Creatinine, Ser: 0.79 mg/dL (ref 0.44–1.00)
Creatinine, Ser: 0.91 mg/dL (ref 0.44–1.00)
GFR calc Af Amer: >60 mL/min (ref >60)
GFR calc Af Amer: >60 mL/min (ref >60)
GFR calc non Af Amer: >60 mL/min (ref >60)
GFR calc non Af Amer: >60 mL/min (ref >60)
Glucose, Bld: 100 mg/dL — ABNORMAL HIGH (ref 70–99)
Glucose, Bld: 102 mg/dL — ABNORMAL HIGH (ref 70–99)
Potassium: <2 mmol/L — CL (ref 3.5–5.1)
Potassium: <2 mmol/L — CL (ref 3.5–5.1)
Sodium: 117 mmol/L — CL (ref 135–145)
Sodium: 117 mmol/L — CL (ref 135–145)
Total Bilirubin: 1.1 mg/dL (ref 0.3–1.2)
Total Bilirubin: 1 mg/dL (ref 0.3–1.2)
Total Protein: 6.5 g/dL (ref 6.5–8.1)
Total Protein: 6.3 g/dL — ABNORMAL LOW (ref 6.5–8.1)

## 2020-01-02 LAB — CK: Total CK: 2329 U/L — ABNORMAL HIGH (ref 38–234)

## 2020-01-02 LAB — CBC
HCT: 42.6 % (ref 36.0–46.0)
HCT: 39.4 % (ref 36.0–46.0)
Hemoglobin: 14.2 g/dL (ref 12.0–15.0)
Hemoglobin: 13.2 g/dL (ref 12.0–15.0)
MCH: 23.3 pg — ABNORMAL LOW (ref 26.0–34.0)
MCH: 23.4 pg — ABNORMAL LOW (ref 26.0–34.0)
MCHC: 33.3 g/dL (ref 30.0–36.0)
MCHC: 33.5 g/dL (ref 30.0–36.0)
MCV: 70 fL — ABNORMAL LOW (ref 80.0–100.0)
MCV: 69.7 fL — ABNORMAL LOW (ref 80.0–100.0)
Platelets: 297 10*3/uL (ref 150–400)
Platelets: 270 10*3/uL (ref 150–400)
RBC: 6.09 MIL/uL — ABNORMAL HIGH (ref 3.87–5.11)
RBC: 5.65 MIL/uL — ABNORMAL HIGH (ref 3.87–5.11)
RDW: 13.6 % (ref 11.5–15.5)
RDW: 13.6 % (ref 11.5–15.5)
WBC: 12.9 10*3/uL — ABNORMAL HIGH (ref 4.0–10.5)
WBC: 12.2 10*3/uL — ABNORMAL HIGH (ref 4.0–10.5)
nRBC: 0 % (ref 0.0–0.2)
nRBC: 0 % (ref 0.0–0.2)

## 2020-01-02 LAB — LACTATE DEHYDROGENASE: LDH: 259 U/L — ABNORMAL HIGH (ref 98–192)

## 2020-01-02 LAB — PHOSPHORUS: Phosphorus: 2.1 mg/dL — ABNORMAL LOW (ref 2.5–4.6)

## 2020-01-02 LAB — URINALYSIS, ROUTINE W REFLEX MICROSCOPIC
Bilirubin Urine: NEGATIVE
Glucose, UA: NEGATIVE mg/dL
Hgb urine dipstick: NEGATIVE
Ketones, ur: 5 mg/dL — AB
Leukocytes,Ua: NEGATIVE
Nitrite: NEGATIVE
Protein, ur: NEGATIVE mg/dL
Specific Gravity, Urine: 1.002 — ABNORMAL LOW (ref 1.005–1.030)
pH: 7 (ref 5.0–8.0)

## 2020-01-02 LAB — BLOOD GAS, VENOUS
Acid-Base Excess: 15.9 mmol/L — ABNORMAL HIGH (ref 0.0–2.0)
Bicarbonate: 41.6 mmol/L — ABNORMAL HIGH (ref 20.0–28.0)
O2 Saturation: 54.2 %
Patient temperature: 98.6
pCO2, Ven: 52.7 mmHg (ref 44.0–60.0)
pH, Ven: 7.509 — ABNORMAL HIGH (ref 7.250–7.430)

## 2020-01-02 LAB — RESPIRATORY PANEL BY RT PCR (FLU A&B, COVID)
Influenza A by PCR: NEGATIVE
Influenza B by PCR: NEGATIVE
SARS Coronavirus 2 by RT PCR: NEGATIVE

## 2020-01-02 LAB — FIBRINOGEN: Fibrinogen: 477 mg/dL — ABNORMAL HIGH (ref 210–475)

## 2020-01-02 LAB — TSH: TSH: 0.692 u[IU]/mL (ref 0.350–4.500)

## 2020-01-02 LAB — LIPASE, BLOOD: Lipase: 23 U/L (ref 11–51)

## 2020-01-02 LAB — TROPONIN I (HIGH SENSITIVITY): Troponin I (High Sensitivity): 56 ng/L — ABNORMAL HIGH (ref <18)

## 2020-01-02 LAB — POC SARS CORONAVIRUS 2 AG -  ED: SARS Coronavirus 2 Ag: NEGATIVE

## 2020-01-02 LAB — FERRITIN: Ferritin: 263 ng/mL (ref 11–307)

## 2020-01-02 LAB — MAGNESIUM: Magnesium: 2.1 mg/dL (ref 1.7–2.4)

## 2020-01-02 LAB — C-REACTIVE PROTEIN: CRP: 7 mg/dL — ABNORMAL HIGH (ref <1.0)

## 2020-01-02 LAB — D-DIMER, QUANTITATIVE: D-Dimer, Quant: 2.42 ug/mL-FEU — ABNORMAL HIGH (ref 0.00–0.50)

## 2020-01-02 LAB — PROCALCITONIN: Procalcitonin: <0.1 ng/mL

## 2020-01-02 MED ORDER — POTASSIUM CHLORIDE 10 MEQ/100ML IV SOLN
10.0000 meq | Freq: Once | INTRAVENOUS | Status: AC
  Administered 2020-01-02: 18:00:00 10 meq via INTRAVENOUS
  Filled 2020-01-02: qty 100

## 2020-01-02 MED ORDER — THIAMINE HCL 100 MG/ML IJ SOLN
100.0000 mg | Freq: Every day | INTRAMUSCULAR | Status: DC
  Administered 2020-01-02: 100 mg via INTRAVENOUS
  Filled 2020-01-02: qty 2

## 2020-01-02 MED ORDER — POTASSIUM CHLORIDE CRYS ER 20 MEQ PO TBCR
40.0000 meq | EXTENDED_RELEASE_TABLET | Freq: Once | ORAL | Status: AC
  Administered 2020-01-02: 40 meq via ORAL
  Filled 2020-01-02: qty 2

## 2020-01-02 MED ORDER — SODIUM CHLORIDE 0.9 % IV SOLN: INTRAVENOUS | Status: DC

## 2020-01-02 MED ORDER — POTASSIUM PHOSPHATE MONOBASIC 500 MG PO TABS
500.0000 mg | ORAL_TABLET | Freq: Three times a day (TID) | ORAL | Status: DC
  Administered 2020-01-02 – 2020-01-05 (×10): 500 mg via ORAL
  Filled 2020-01-02 (×14): qty 1

## 2020-01-02 MED ORDER — FOLIC ACID 1 MG PO TABS
1.0000 mg | ORAL_TABLET | Freq: Every day | ORAL | Status: DC
  Administered 2020-01-02 – 2020-01-05 (×4): 1 mg via ORAL
  Filled 2020-01-02 (×4): qty 1

## 2020-01-02 MED ORDER — SODIUM CHLORIDE 0.9 % IV BOLUS
500.0000 mL | Freq: Once | INTRAVENOUS | Status: AC
  Administered 2020-01-02: 500 mL via INTRAVENOUS

## 2020-01-02 MED ORDER — POTASSIUM CHLORIDE 10 MEQ/100ML IV SOLN
10.0000 meq | INTRAVENOUS | Status: AC
  Administered 2020-01-02 – 2020-01-03 (×6): 10 meq via INTRAVENOUS
  Filled 2020-01-02 (×6): qty 100

## 2020-01-02 NOTE — ED Notes (Signed)
Pt placed on purewick education provided. Urine sample still needed

## 2020-01-02 NOTE — ED Provider Notes (Signed)
[facility] DEPT Provider Note   CSN: [id number] Arrival date & time: 01/02/20  1416     History Chief Complaint  Patient presents with  . Multiple Complaints    Jackie Odonnell is a 59 y.o. female with a past medical history of anxiety, bipolar, paranoid schizophrenia, hypertension, who presents today for evaluation of multiple complaints.  She reports that 3 days ago she felt generally weak, reports nausea, vomiting, poor appetite, feeling dizzy and off balance.  She states that had a mild fever 3 days ago.  She states that then she felt better yesterday and initially felt well today.  She states "I feel like everything is about to shut down."  She reports that she did fall recently, she is unable to tell me exactly when, she denies striking her head however reports that she has pain in her left knee since, and denies any other injuries.  She is unable to tell me when today she started feeling unwell again.  She states that her sister kept telling her she needed to get seen however she kept delaying.   She denies any specific abdominal pain, chest pain, or headache.  She states that she has recently been noncompliant with her potassium however she called to get a refill.  Patient is a poor historian.  She is unable to tell me how long she was out of her potassium or when she re-started it.   HPI     Past Medical History:  Diagnosis Date  . Anxiety   . Bipolar affective ([place])    Chemical Imbalance  . Depression   . Hypertension     Patient Active Problem List   Diagnosis Date Noted  . Microcytosis 04/01/2016  . Chronic cough 11/07/2014  . Schizophrenia, paranoid ([place]) 08/09/2012  . Tobacco abuse 08/06/2012  . Hyponatremia 08/05/2012  . Hypokalemia 08/05/2012  . Suicidal ideation 08/05/2012  . Essential hypertension 04/25/2009  . ASTHMA 10/18/2007  . PNEUMONIA, HX OF 10/18/2007    Past Surgical History:  Procedure Laterality Date  .  ABDOMINAL HYSTERECTOMY    . ECTOPIC PREGNANCY SURGERY    . intestines removed     scar tissue issue     OB History   No obstetric history on file.     Family History  Problem Relation Age of Onset  . Colon cancer Neg Hx   . Esophageal cancer Neg Hx   . Rectal cancer Neg Hx   . Stomach cancer Neg Hx     Social History   Tobacco Use  . Smoking status: Current Every Day Smoker    Packs/day: 2.00    Years: 15.00    Pack years: 30.00    Types: Cigarettes  . Smokeless tobacco: Never Used  Substance Use Topics  . Alcohol use: No    Alcohol/week: 0.0 standard drinks  . Drug use: No    Home Medications Prior to Admission medications   Medication Sig Start Date End Date Taking? Authorizing Provider  benztropine (COGENTIN) 0.5 MG tablet Take 0.5 mg by mouth 2 (two) times daily.   Yes [provider]  busPIRone (BUSPAR) 10 MG tablet Take 10 mg by mouth 3 (three) times daily.  05/05/18  Yes [provider]  haloperidol (HALDOL) 10 MG tablet Take 10 mg by mouth 2 (two) times daily.  05/05/18  Yes [provider]  lamoTRIgine (LAMICTAL) 100 MG tablet Take 100 mg by mouth 2 (two) times daily. 11/11/19  Yes [provider]  lisinopril-hydrochlorothiazide (ZESTORETIC) 20-12.5 MG tablet Take 1 tablet by mouth daily. 11/30/19  Yes Olive Bass, FNP  metoprolol tartrate (LOPRESSOR) 100 MG tablet TAKE 1 TABLET BY MOUTH TWICE DAILY Patient taking differently: Take 100 mg by mouth 2 (two) times daily.  09/23/19  Yes Olive Bass, FNP  Multiple Vitamins-Iron (MULTIVITAMINS WITH IRON) TABS tablet Take 1 tablet by mouth daily.   Yes [provider]  potassium chloride SA (KLOR-CON) 20 MEQ tablet TAKE 1 TABLET(20 MEQ) BY MOUTH DAILY Patient taking differently: Take 20 mEq by mouth daily.  11/28/19  Yes Olive Bass, FNP    Allergies    Patient has no known allergies.  Review of Systems   Review of Systems  Constitutional:  Positive for chills, fatigue and fever.  HENT: Negative for congestion.   Eyes: Negative for visual disturbance.  Respiratory: Negative for cough and shortness of breath.   Cardiovascular: Negative for chest pain.  Gastrointestinal: Positive for diarrhea, nausea and vomiting. Negative for abdominal pain.  Musculoskeletal: Positive for myalgias.  Skin: Negative for color change and wound.  Neurological: Positive for dizziness and weakness (Whole body). Negative for light-headedness, numbness and headaches.  All other systems reviewed and are negative.   Physical Exam Updated Vital Signs BP 132/63   Pulse 88   Temp 98.4 F (36.9 C) (Oral)   Resp 12   Ht 5' 6.5" (1.689 m)   Wt 86.2 kg   SpO2 98%   BMI 30.21 kg/m   Physical Exam Vitals and nursing note reviewed.  Constitutional:      General: She is not in acute distress.    Appearance: She is ill-appearing.  HENT:     Head: Normocephalic and atraumatic.  Eyes:     Extraocular Movements: Extraocular movements intact.     Conjunctiva/sclera: Conjunctivae normal.     Comments: Disconjugate gaze  Cardiovascular:     Rate and Rhythm: Normal rate and regular rhythm.     Pulses: Normal pulses.     Heart sounds: Normal heart sounds. No murmur.  Pulmonary:     Effort: Pulmonary effort is normal. No respiratory distress.     Breath sounds: Normal breath sounds.  Abdominal:     General: Abdomen is flat. There is no distension.     Palpations: Abdomen is soft.     Tenderness: There is no abdominal tenderness.  Musculoskeletal:     Cervical back: Full passive range of motion without pain, normal range of motion and neck supple.  Skin:    General: Skin is warm and dry.  Neurological:     Mental Status: She is alert.     Comments: Patient is awake, alert.  Speech is not slurred.  Moves all 4 extremities without difficulty.  Psychiatric:        Attention and Perception: She is inattentive.        Mood and Affect: Mood normal.  Affect is flat.        Speech: Speech is tangential.        Behavior: Behavior normal.     Comments: Patient occasionally has difficulty with the specifics of a question, such as when asked when her symptoms started she told me her symptom.      ED Results / Procedures / Treatments   Labs (all labs ordered are listed, but only abnormal results are displayed) Labs Reviewed  COMPREHENSIVE METABOLIC PANEL - Abnormal; Notable for the following components:      Result Value  Sodium 117 (*)    Potassium <2.0 (*)    Chloride <65 (*)    CO2 35 (*)    Glucose, Bld 100 (*)    Calcium 8.3 (*)    Albumin 3.4 (*)    AST 95 (*)    ALT 49 (*)    All other components within normal limits  CBC - Abnormal; Notable for the following components:   WBC 12.9 (*)    RBC 6.09 (*)    MCV 70.0 (*)    MCH 23.3 (*)    All other components within normal limits  CBC - Abnormal; Notable for the following components:   WBC 12.2 (*)    RBC 5.65 (*)    MCV 69.7 (*)    MCH 23.4 (*)    All other components within normal limits  COMPREHENSIVE METABOLIC PANEL - Abnormal; Notable for the following components:   Sodium 117 (*)    Potassium <2.0 (*)    Chloride <65 (*)    CO2 38 (*)    Glucose, Bld 102 (*)    Calcium 8.3 (*)    Total Protein 6.3 (*)    Albumin 3.4 (*)    AST 86 (*)    ALT 46 (*)    All other components within normal limits  BLOOD GAS, VENOUS - Abnormal; Notable for the following components:   pH, Ven 7.509 (*)    Bicarbonate 41.6 (*)    Acid-Base Excess 15.9 (*)    All other components within normal limits  PHOSPHORUS - Abnormal; Notable for the following components:   Phosphorus 2.1 (*)    All other components within normal limits  RESPIRATORY PANEL BY RT PCR (FLU A&B, COVID)  LIPASE, BLOOD  MAGNESIUM  URINALYSIS, ROUTINE W REFLEX MICROSCOPIC  SODIUM, URINE, RANDOM  CREATININE, URINE, RANDOM  OSMOLALITY, URINE  TSH  CK  POC SARS CORONAVIRUS 2 AG -  ED     EKG None  Radiology DG Chest Port 1 View  Result Date: 01/02/2020 CLINICAL DATA:  Dizziness, fatigue EXAM: PORTABLE CHEST 1 VIEW COMPARISON:  01/30/2015 FINDINGS: The heart size and mediastinal contours are within normal limits. Both lungs are clear. The visualized skeletal structures are unremarkable. IMPRESSION: No active disease. Electronically Signed   By: Duanne Guess D.O.   On: 01/02/2020 18:00    Procedures .Critical Care Performed by: Cristina Gong, PA-C Authorized by: Cristina Gong, PA-C   Critical care provider statement:    Critical care time (minutes):  45   Critical care was necessary to treat or prevent imminent or life-threatening deterioration of the following conditions:  Metabolic crisis   Critical care was time spent personally by me on the following activities:  Discussions with consultants, evaluation of patient's response to treatment, examination of patient, ordering and performing treatments and interventions, ordering and review of laboratory studies, ordering and review of radiographic studies, pulse oximetry, re-evaluation of patient's condition, obtaining history from patient or surrogate and review of old charts Comments:     Multiple significant electrolyte derangements   (including critical care time)  Medications Ordered in ED Medications  potassium chloride 10 mEq in 100 mL IVPB (10 mEq Intravenous New Bag/Given 01/02/20 2328)  0.9 %  sodium chloride infusion ( Intravenous New Bag/Given 01/02/20 2239)  potassium chloride SA (KLOR-CON) CR tablet 40 mEq (40 mEq Oral Given 01/02/20 1753)  sodium chloride 0.9 % bolus 500 mL (0 mLs Intravenous Stopped 01/02/20 1824)  potassium chloride 10 mEq in  100 mL IVPB (0 mEq Intravenous Stopped 01/02/20 1933)    ED Course  I have reviewed the triage vital signs and the nursing notes.  Pertinent labs & imaging results that were available during my care of the patient were reviewed by me and  considered in my medical decision making (see chart for details).    MDM Rules/Calculators/A&P                     Patient presents today for evaluation of generally feeling unwell.  History is limited by patient being a poor historian.  Labs are obtained and reviewed, significant for a sodium of 117, potassium under 2 and chloride under 65, this was repeated given the significant abnormality with consistent results.  VBG was obtained showing a pH of 7.5 concerning for alkalosis.  She was given p.o. potassium and multiple rounds of IV potassium were ordered.  As patient reports she has been out of her potassium I suspect that this may be contributing, however there is no clear cause for her multiple electrolyte derangements at this time.  Do question coronavirus, given her generally feeling unwell with reportedly poor p.o. intake, and testing was sent.  Antigen and PCR testing were both negative for coronavirus.    I spoke with Dr. Adela Glimpse who will see patient for admission.   Jackie Odonnell was evaluated in Emergency Department on 01/02/2020 for the symptoms described in the history of present illness. She was evaluated in the context of the global COVID-19 pandemic, which necessitated consideration that the patient might be at risk for infection with the SARS-CoV-2 virus that causes COVID-19. Institutional protocols and algorithms that pertain to the evaluation of patients at risk for COVID-19 are in a state of rapid change based on information released by regulatory bodies including the CDC and federal and state organizations. These policies and algorithms were followed during the patient's care in the ED.   Final Clinical Impression(s) / ED Diagnoses Final diagnoses:  Hypokalemia  Hyponatremia  Hypochloremia    Rx / DC Orders ED Discharge Orders    None       Norman Clay 01/02/20 2342    Alvira Monday, MD 01/04/20 1510

## 2020-01-02 NOTE — ED Notes (Signed)
Date and time results received: 01/02/20  20:11  (use smartphrase ".now" to insert current time)  Test: Sodium 117 , Potassium < 2.0, Chloride < 65 Critical Value:   Name of Provider Notified: Dr. Casimer Lanius   Orders Received? Or Actions Taken?: Continue to monitor patient and await new orders.

## 2020-01-02 NOTE — ED Notes (Signed)
Pt made aware urine sample needed 

## 2020-01-02 NOTE — ED Notes (Signed)
PA made aware pt not able to give urine sample and in and out cath should wait until bladder has contents.

## 2020-01-02 NOTE — H&P (Signed)
Jackie Odonnell [id number]:[id number] DOB: Apr 19, 1961 DOA: 01/02/2020     PCP: Marrian Salvage, FNP   Outpatient Specialists:  NONE     Patient arrived to ER on 01/02/20 at 1416  Patient coming from: home Lives alone,      Chief Complaint:   Chief Complaint  Patient presents with  . Multiple Complaints    HPI: Jackie Odonnell is a 59 y.o. female with medical history significant of schizoaffective disorder, tobacco abuse, hypokalemia    Presented with generalized weakness nausea vomiting diarrhea feels like her bones are hurting her for the past 3 days called EMS Reports fever few days ago. She has not been compliant with all her medications vomiting She continues to smoke  Family states she has not been taking her medicines  She goes shopping No sick contacts  Infectious risk factors:  Reports  Fever, N/V/Diarrhea/abdominal pain,  Body aches, severe fatigue   In  ER RAPID COVID TEST  NEGATIVE  in house  PCR testing  Pending  No results found for: SARSCOV2NAA   Regarding pertinent Chronic problems:      HTN on hydrochlorothiazide Lopressor  Schizoaffective disorder Haldol and BuSpar and Cogentin    obesity-   BMI Readings from Last 1 Encounters:  01/02/20 30.21 kg/m       Asthma -well  controlled on home inhalers     While in ER: Found to have sodium of 117 Chest x-ray unremarkable Rapid Covid negative  I Called :  Nephrology    Dr.Patel  They Recommend admit to medicine   Will see in AM*  in ER   The following Work up has been ordered so far:  Orders Placed This Encounter  Procedures  . Respiratory Panel by RT PCR (Flu A&B, Covid) - Nasopharyngeal Swab  . DG Chest Port 1 View  . Lipase, blood  . Comprehensive metabolic panel  . CBC  . Urinalysis, Routine w reflex microscopic  . CBC  . Comprehensive metabolic panel  . Magnesium  . Blood gas, venous (at [facility] and [facility], not at [facility])  . Phosphorus  . Diet NPO time specified  . Saline Lock  IV, Maintain IV access  . Cardiac monitoring  . Consult to hospitalist  ALL PATIENTS BEING ADMITTED/HAVING PROCEDURES NEED COVID-19 SCREENING  . Pulse oximetry, continuous  . POC SARS Coronavirus 2 Ag-ED - Nasal Swab (BD Veritor Kit)  . ED EKG  . EKG 12-Lead  . EKG 12-Lead     Following Medications were ordered in ER: Medications  potassium chloride 10 mEq in 100 mL IVPB (has no administration in time range)  potassium chloride SA (KLOR-CON) CR tablet 40 mEq (40 mEq Oral Given 01/02/20 1753)  sodium chloride 0.9 % bolus 500 mL (0 mLs Intravenous Stopped 01/02/20 1824)  potassium chloride 10 mEq in 100 mL IVPB (0 mEq Intravenous Stopped 01/02/20 1933)        Consult Orders  (From admission, onward)         Start     Ordered   01/02/20 2015  Consult to hospitalist  ALL PATIENTS BEING ADMITTED/HAVING PROCEDURES NEED COVID-19 SCREENING  Once    Comments: ALL PATIENTS BEING ADMITTED/HAVING PROCEDURES NEED COVID-19 SCREENING  Provider:  (Not yet assigned)  Question Answer Comment  Place call to: [facility]   Reason for Consult Admit      01/02/20 2014           Significant initial  Findings: Abnormal Labs Reviewed  COMPREHENSIVE METABOLIC PANEL - Abnormal; Notable for the following components:      Result Value   Sodium 117 (*)    Potassium <2.0 (*)    Chloride <65 (*)    CO2 35 (*)    Glucose, Bld 100 (*)    Calcium 8.3 (*)    Albumin 3.4 (*)    AST 95 (*)    ALT 49 (*)    All other components within normal limits  CBC - Abnormal; Notable for the following components:   WBC 12.9 (*)    RBC 6.09 (*)    MCV 70.0 (*)    MCH 23.3 (*)    All other components within normal limits  CBC - Abnormal; Notable for the following components:   WBC 12.2 (*)    RBC 5.65 (*)    MCV 69.7 (*)    MCH 23.4 (*)    All other components within normal limits  COMPREHENSIVE METABOLIC PANEL - Abnormal; Notable for the following components:   Sodium 117 (*)    Potassium <2.0 (*)     Chloride <65 (*)    CO2 38 (*)    Glucose, Bld 102 (*)    Calcium 8.3 (*)    Total Protein 6.3 (*)    Albumin 3.4 (*)    AST 86 (*)    ALT 46 (*)    All other components within normal limits  BLOOD GAS, VENOUS - Abnormal; Notable for the following components:   pH, Ven 7.509 (*)    Bicarbonate 41.6 (*)    Acid-Base Excess 15.9 (*)    All other components within normal limits  PHOSPHORUS - Abnormal; Notable for the following components:   Phosphorus 2.1 (*)    All other components within normal limits   Otherwise labs showing:    Recent Labs  Lab 01/02/20 1533 01/02/20 1756  NA 117* 117*  K <2.0* <2.0*  CO2 35* 38*  GLUCOSE 100* 102*  BUN 7 8  CREATININE 0.79 0.91  CALCIUM 8.3* 8.3*  MG  --  2.1  PHOS  --  2.1*   Cr   Stable,  Lab Results  Component Value Date   CREATININE 0.91 01/02/2020   CREATININE 0.79 01/02/2020   CREATININE 1.00 06/17/2018    Recent Labs  Lab 01/02/20 1533 01/02/20 1756  AST 95* 86*  ALT 49* 46*  ALKPHOS 106 100  BILITOT 1.1 1.0  PROT 6.5 6.3*  ALBUMIN 3.4* 3.4*   Lab Results  Component Value Date   CALCIUM 8.3 (L) 01/02/2020   PHOS 2.1 (L) 01/02/2020      WBC      Component Value Date/Time   WBC 12.2 (H) 01/02/2020 1756   ANC    Component Value Date/Time   NEUTROABS 5.4 05/14/2018 1404   ALC No components found for: LYMPHAB    Plt: Lab Results  Component Value Date   PLT 270 01/02/2020    Lactic Acid, Venous No results found for: LATICACIDVEN  Procalcitonin   Ordered   COVID-19 Labs  No results for input(s): DDIMER, FERRITIN, LDH, CRP in the last 72 hours.  No results found for: SARSCOV2NAA   HG/HCT  stable,      Component Value Date/Time   HGB 13.2 01/02/2020 1756   HCT 39.4 01/02/2020 1756    Recent Labs  Lab 01/02/20 1533  LIPASE 23   No results for input(s): AMMONIA in the last 168 hours.  No components found for: LABALBU  Cardiac Panel (last 3 results) No results for  input(s): CKTOTAL, CKMB, TROPONINI, RELINDX in the last 72 hours.     ECG: Ordered Personally reviewed by me showing: HR : 85 Rhythm:  NSR peaked p waves   no evidence of ischemic changes QTC 594    UA  ordered        CXR -  NON acute    ED Triage Vitals  Enc Vitals Group     BP 01/02/20 1425 (!) 110/58     Pulse Rate 01/02/20 1425 91     Resp 01/02/20 1425 16     Temp 01/02/20 1425 98.4 F (36.9 C)     Temp Source 01/02/20 1425 Oral     SpO2 01/02/20 1425 100 %     Weight 01/02/20 1446 190 lb (86.2 kg)     Height 01/02/20 1446 5' 6.5" (1.689 m)     Head Circumference --      Peak Flow --      Pain Score 01/02/20 1445 8     Pain Loc --      Pain Edu? --      Excl. in [place]? --   TMAX(24)@       Latest  Blood pressure 132/63, pulse 88, temperature 98.4 F (36.9 C), temperature source Oral, resp. rate 12, height 5' 6.5" (1.689 m), weight 86.2 kg, SpO2 98 %.     Hospitalist was called for admission for hyponatremia   Review of Systems:    Pertinent positives include:  Fevers, chills, fatigue  Constitutional:  No weight loss, night sweats, weight loss  HEENT:  No headaches, Difficulty swallowing,Tooth/dental problems,Sore throat,  No sneezing, itching, ear ache, nasal congestion, post nasal drip,  Cardio-vascular:  No chest pain, Orthopnea, PND, anasarca, dizziness, palpitations.no Bilateral lower extremity swelling  GI:  No heartburn, indigestion, abdominal pain, nausea, vomiting, diarrhea, change in bowel habits, loss of appetite, melena, blood in stool, hematemesis Resp:  no shortness of breath at rest. No dyspnea on exertion, No excess mucus, no productive cough, No non-productive cough, No coughing up of blood.No change in color of mucus.No wheezing. Skin:  no rash or lesions. No jaundice GU:  no dysuria, change in color of urine, no urgency or frequency. No straining to urinate.  No flank pain.  Musculoskeletal:  No joint pain or no joint swelling. No  decreased range of motion. No back pain.  Psych:  No change in mood or affect. No depression or anxiety. No memory loss.  Neuro: no localizing neurological complaints, no tingling, no weakness, no double vision, no gait abnormality, no slurred speech, no confusion  All systems reviewed and apart from [place] all are negative  Past Medical History:   Past Medical History:  Diagnosis Date  . Anxiety   . Bipolar affective ([place])    Chemical Imbalance  . Depression   . Hypertension      Past Surgical History:  Procedure Laterality Date  . ABDOMINAL HYSTERECTOMY    . ECTOPIC PREGNANCY SURGERY    . intestines removed     scar tissue issue    Social History:  Ambulatory   Independently     reports that she has been smoking cigarettes. She has a 30.00 pack-year smoking history. She has never used smokeless tobacco. She reports that she does not drink alcohol or use drugs.    Family History:   Family History  Problem Relation Age of Onset  . Colon cancer Neg Hx   .  Esophageal cancer Neg Hx   . Rectal cancer Neg Hx   . Stomach cancer Neg Hx     Allergies: No Known Allergies   Prior to Admission medications   Medication Sig Start Date End Date Taking? Authorizing Provider  benztropine (COGENTIN) 0.5 MG tablet Take 0.5 mg by mouth 2 (two) times daily.   Yes [provider]  busPIRone (BUSPAR) 10 MG tablet Take 10 mg by mouth 3 (three) times daily.  05/05/18  Yes [provider]  haloperidol (HALDOL) 10 MG tablet Take 10 mg by mouth 2 (two) times daily.  05/05/18  Yes [provider]  lamoTRIgine (LAMICTAL) 100 MG tablet Take 100 mg by mouth 2 (two) times daily. 11/11/19  Yes [provider]  lisinopril-hydrochlorothiazide (ZESTORETIC) 20-12.5 MG tablet Take 1 tablet by mouth daily. 11/30/19  Yes Marrian Salvage, FNP  metoprolol tartrate (LOPRESSOR) 100 MG tablet TAKE 1 TABLET BY MOUTH TWICE DAILY Patient taking differently: Take 100 mg by  mouth 2 (two) times daily.  09/23/19  Yes Marrian Salvage, FNP  Multiple Vitamins-Iron (MULTIVITAMINS WITH IRON) TABS tablet Take 1 tablet by mouth daily.   Yes [provider]  potassium chloride SA (KLOR-CON) 20 MEQ tablet TAKE 1 TABLET(20 MEQ) BY MOUTH DAILY Patient taking differently: Take 20 mEq by mouth daily.  11/28/19  Yes Marrian Salvage, FNP   Physical Exam: Blood pressure 132/63, pulse 88, temperature 98.4 F (36.9 C), temperature source Oral, resp. rate 12, height 5' 6.5" (1.689 m), weight 86.2 kg, SpO2 98 %. 1. General:  in No  Acute distress   Chronically ill   -appearing 2. Psychological: Alert and   Oriented to self 3. Head/ENT:     Dry Mucous Membranes                          Head Non traumatic, neck supple                           Poor Dentition 4. SKIN:    decreased Skin turgor,  Skin clean Dry and intact no rash 5. Heart: Regular rate and rhythm no Murmur, no Rub or gallop 6. Lungs:  Clear to auscultation bilaterally, no wheezes or crackles   7. Abdomen: Soft, non-tender, Non distended  obese  bowel sounds present 8. Lower extremities: no clubbing, cyanosis, no  edema 9. Neurologically Grossly intact, moving all 4 extremities equally  10. MSK: Normal range of motion   All other LABS:     Recent Labs  Lab 01/02/20 1533 01/02/20 1756  WBC 12.9* 12.2*  HGB 14.2 13.2  HCT 42.6 39.4  MCV 70.0* 69.7*  PLT 297 270     Recent Labs  Lab 01/02/20 1533 01/02/20 1756  NA 117* 117*  K <2.0* <2.0*  CL <65* <65*  CO2 35* 38*  GLUCOSE 100* 102*  BUN 7 8  CREATININE 0.79 0.91  CALCIUM 8.3* 8.3*  MG  --  2.1  PHOS  --  2.1*     Recent Labs  Lab 01/02/20 1533 01/02/20 1756  AST 95* 86*  ALT 49* 46*  ALKPHOS 106 100  BILITOT 1.1 1.0  PROT 6.5 6.3*  ALBUMIN 3.4* 3.4*       Cultures: No results found for: SDES, SPECREQUEST, CULT, REPTSTATUS   Radiological Exams on Admission: DG Chest Port 1 View  Result Date:  01/02/2020 CLINICAL DATA:  Dizziness, fatigue  EXAM: PORTABLE CHEST 1 VIEW COMPARISON:  01/30/2015 FINDINGS: The heart size and mediastinal contours are within normal limits. Both lungs are clear. The visualized skeletal structures are unremarkable. IMPRESSION: No active disease. Electronically Signed   By: Davina Poke D.O.   On: 01/02/2020 18:00    Chart has been reviewed  Assessment/Plan  59 y.o. female with medical history significant of schizoaffective disorder, tobacco abuse, hypokalemia Admitted for hyponatremia   Present on Admission: . Hyponatremia -order urine electrolytes, Gently rehydrate with normal saline at 100 discussed with nephrology at this point would not initiate 3% normal saline -Frequent labs Check TSH Hold hydrochlorothiazide BuSpar and other psychiatric medications for now  Acute encephalopathy patient states she is in generally not feeling well and she wanted her body shutting down endorsing problems with balance.  This could be secondary to hyponatremia but obtain CT of the head to further evaluate  . Essential hypertension -continue metoprolol  . Asthma -continue home meds currently stable  . Hypokalemia - - will replace and repeat in AM,  check magnesium level and replace as needed  . Schizophrenia, paranoid ([place]) -once electrolytes stable can consider restarting Haldol and Cogentin  . Tobacco abuse -at this point not interested in quitting.  Cough nausea vomiting diarrhea body aches -initial Covid testing negative will obtain PCR.  Admit as PUI obtain respiratory panel  Elevated LFT's -possibly secondary to dehydration check CK level, hepatitis panel Inflammatory markers Check for Covid status If Covid negative and CK within normal limits could consider pursuing right upper quadrant ultrasound  Dehydration will rehydrate and follow Other plan as per orders.  DVT prophylaxis:  SCD    Code Status:  FULL CODE  per  family  I had personally  discussed CODE STATUS with  and family     Family Communication:   Family not at  Bedside  plan of care was discussed on the phone with  Sister   Disposition Plan:   To home once workup is complete and patient is stable                      Consults called:  Discussed with nephrology, please re consult in AM if needed  Admission status:  ED Disposition    None        inpatient     Expect 2 midnight stay secondary to severity of patient's current illness including   hemodynamic instability despite optimal treatment ( hypotension )  Severe lab/radiological/exam abnormalities including:  Na 117   and extensive comorbidities including:  asthma Psychiatric illness   That are currently affecting medical management.   I expect  patient to be hospitalized for 2 midnights requiring inpatient medical care.  Patient is at high risk for adverse outcome (such as loss of life or disability) if not treated.  Indication for inpatient stay as follows:    change from baseline regarding mental status    need for frequent labs  Need for IV antibiotics, IV fluids,      Level of care       SDU tele indefinitely please discontinue once patient no longer qualifies   Precautions: admitted as PUI  Airborne and Contact precautions      If Covid PCR is negative  - and inflammatory markers are negative ok to dc precautions other wise will need to repeat   PPE: Used by the provider:   P100  eye Goggles,  Gloves  gown     Jackie Odonnell  Jackie Odonnell 01/02/2020, 9:50 PM    [facility]     after 2 AM please page floor coverage PA If 7AM-7PM, please contact the day team taking care of the patient using [email or web address]

## 2020-01-02 NOTE — ED Triage Notes (Signed)
Patient is from home and transported via [facility]. Patient is complaining of generalized weakness, nausea, vomiting, diarrhea, lack of appetite, dizzy, "off balance", and "soreness in bones" for the last 3 days. Patient is in a wheelchair.

## 2020-01-02 NOTE — ED Notes (Addendum)
Pt assisted to RR using steady. Pt was not able to obtain urine sample

## 2020-01-03 DIAGNOSIS — R9431 Abnormal electrocardiogram [ECG] [EKG]: Secondary | ICD-10-CM | POA: Diagnosis present

## 2020-01-03 LAB — COMPREHENSIVE METABOLIC PANEL
ALT: 46 U/L — ABNORMAL HIGH (ref 0–44)
AST: 71 U/L — ABNORMAL HIGH (ref 15–41)
Albumin: 3.2 g/dL — ABNORMAL LOW (ref 3.5–5.0)
Alkaline Phosphatase: 91 U/L (ref 38–126)
Anion gap: 15 (ref 5–15)
BUN: 7 mg/dL (ref 6–20)
CO2: 33 mmol/L — ABNORMAL HIGH (ref 22–32)
Calcium: 8.3 mg/dL — ABNORMAL LOW (ref 8.9–10.3)
Chloride: 75 mmol/L — ABNORMAL LOW (ref 98–111)
Creatinine, Ser: 0.74 mg/dL (ref 0.44–1.00)
GFR calc Af Amer: >60 mL/min (ref >60)
GFR calc non Af Amer: >60 mL/min (ref >60)
Glucose, Bld: 87 mg/dL (ref 70–99)
Potassium: <2 mmol/L — CL (ref 3.5–5.1)
Sodium: 123 mmol/L — ABNORMAL LOW (ref 135–145)
Total Bilirubin: 0.7 mg/dL (ref 0.3–1.2)
Total Protein: 6 g/dL — ABNORMAL LOW (ref 6.5–8.1)

## 2020-01-03 LAB — MRSA PCR SCREENING: MRSA by PCR: NEGATIVE

## 2020-01-03 LAB — MAGNESIUM: Magnesium: 2.2 mg/dL (ref 1.7–2.4)

## 2020-01-03 LAB — BASIC METABOLIC PANEL
Anion gap: 12 (ref 5–15)
Anion gap: 12 (ref 5–15)
Anion gap: 9 (ref 5–15)
BUN: 6 mg/dL (ref 6–20)
BUN: 8 mg/dL (ref 6–20)
BUN: 11 mg/dL (ref 6–20)
CO2: 34 mmol/L — ABNORMAL HIGH (ref 22–32)
CO2: 34 mmol/L — ABNORMAL HIGH (ref 22–32)
CO2: 30 mmol/L (ref 22–32)
Calcium: 8.1 mg/dL — ABNORMAL LOW (ref 8.9–10.3)
Calcium: 8.6 mg/dL — ABNORMAL LOW (ref 8.9–10.3)
Calcium: 8.1 mg/dL — ABNORMAL LOW (ref 8.9–10.3)
Chloride: 82 mmol/L — ABNORMAL LOW (ref 98–111)
Chloride: 87 mmol/L — ABNORMAL LOW (ref 98–111)
Chloride: 89 mmol/L — ABNORMAL LOW (ref 98–111)
Creatinine, Ser: 0.69 mg/dL (ref 0.44–1.00)
Creatinine, Ser: 0.78 mg/dL (ref 0.44–1.00)
Creatinine, Ser: 0.72 mg/dL (ref 0.44–1.00)
GFR calc Af Amer: >60 mL/min (ref >60)
GFR calc Af Amer: >60 mL/min (ref >60)
GFR calc Af Amer: >60 mL/min (ref >60)
GFR calc non Af Amer: >60 mL/min (ref >60)
GFR calc non Af Amer: >60 mL/min (ref >60)
GFR calc non Af Amer: >60 mL/min (ref >60)
Glucose, Bld: 118 mg/dL — ABNORMAL HIGH (ref 70–99)
Glucose, Bld: 99 mg/dL (ref 70–99)
Glucose, Bld: 92 mg/dL (ref 70–99)
Potassium: 2.3 mmol/L — CL (ref 3.5–5.1)
Potassium: 2.9 mmol/L — ABNORMAL LOW (ref 3.5–5.1)
Potassium: 3.5 mmol/L (ref 3.5–5.1)
Sodium: 128 mmol/L — ABNORMAL LOW (ref 135–145)
Sodium: 133 mmol/L — ABNORMAL LOW (ref 135–145)
Sodium: 128 mmol/L — ABNORMAL LOW (ref 135–145)

## 2020-01-03 LAB — C DIFFICILE QUICK SCREEN W PCR REFLEX
C Diff antigen: NEGATIVE
C Diff interpretation: NOT DETECTED
C Diff toxin: NEGATIVE

## 2020-01-03 LAB — CBC
HCT: 38.9 % (ref 36.0–46.0)
Hemoglobin: 12.9 g/dL (ref 12.0–15.0)
MCH: 23.5 pg — ABNORMAL LOW (ref 26.0–34.0)
MCHC: 33.2 g/dL (ref 30.0–36.0)
MCV: 70.9 fL — ABNORMAL LOW (ref 80.0–100.0)
Platelets: 262 10*3/uL (ref 150–400)
RBC: 5.49 MIL/uL — ABNORMAL HIGH (ref 3.87–5.11)
RDW: 13.8 % (ref 11.5–15.5)
WBC: 10.2 10*3/uL (ref 4.0–10.5)
nRBC: 0 % (ref 0.0–0.2)

## 2020-01-03 LAB — APTT: aPTT: 28 seconds (ref 24–36)

## 2020-01-03 LAB — OSMOLALITY: Osmolality: 241 mOsm/kg — CL (ref 275–295)

## 2020-01-03 LAB — PROTIME-INR
INR: 0.9 (ref 0.8–1.2)
Prothrombin Time: 12.4 seconds (ref 11.4–15.2)

## 2020-01-03 LAB — CREATININE, URINE, RANDOM: Creatinine, Urine: 37.63 mg/dL

## 2020-01-03 LAB — HIV ANTIBODY (ROUTINE TESTING W REFLEX): HIV Screen 4th Generation wRfx: NONREACTIVE

## 2020-01-03 LAB — TROPONIN I (HIGH SENSITIVITY)
Troponin I (High Sensitivity): 64 ng/L — ABNORMAL HIGH (ref <18)
Troponin I (High Sensitivity): 54 ng/L — ABNORMAL HIGH (ref <18)

## 2020-01-03 LAB — PHOSPHORUS: Phosphorus: 1.7 mg/dL — ABNORMAL LOW (ref 2.5–4.6)

## 2020-01-03 LAB — SODIUM, URINE, RANDOM: Sodium, Ur: <10 mmol/L

## 2020-01-03 LAB — OSMOLALITY, URINE: Osmolality, Ur: 81 mOsm/kg — ABNORMAL LOW (ref 300–900)

## 2020-01-03 LAB — TSH: TSH: 0.631 u[IU]/mL (ref 0.350–4.500)

## 2020-01-03 MED ORDER — ENOXAPARIN SODIUM 40 MG/0.4ML ~~LOC~~ SOLN
40.0000 mg | SUBCUTANEOUS | Status: DC
  Administered 2020-01-03 – 2020-01-05 (×3): 40 mg via SUBCUTANEOUS
  Filled 2020-01-03 (×3): qty 0.4

## 2020-01-03 MED ORDER — ACETAMINOPHEN 650 MG RE SUPP: 650.0000 mg | Freq: Four times a day (QID) | RECTAL | Status: DC | PRN

## 2020-01-03 MED ORDER — METOPROLOL TARTRATE 50 MG PO TABS
100.0000 mg | ORAL_TABLET | Freq: Two times a day (BID) | ORAL | Status: DC
  Administered 2020-01-03 – 2020-01-05 (×5): 100 mg via ORAL
  Filled 2020-01-03: qty 4
  Filled 2020-01-03: qty 2
  Filled 2020-01-03 (×3): qty 4
  Filled 2020-01-03: qty 2

## 2020-01-03 MED ORDER — ONDANSETRON HCL 4 MG/2ML IJ SOLN: 4.0000 mg | Freq: Four times a day (QID) | INTRAMUSCULAR | Status: DC | PRN

## 2020-01-03 MED ORDER — ONDANSETRON HCL 4 MG PO TABS: 4.0000 mg | ORAL_TABLET | Freq: Four times a day (QID) | ORAL | Status: DC | PRN

## 2020-01-03 MED ORDER — ENSURE MAX PROTEIN PO LIQD
11.0000 [oz_av] | Freq: Every day | ORAL | Status: DC
  Administered 2020-01-03 – 2020-01-05 (×3): 11 [oz_av] via ORAL
  Filled 2020-01-03 (×3): qty 330

## 2020-01-03 MED ORDER — POTASSIUM CHLORIDE CRYS ER 20 MEQ PO TBCR
40.0000 meq | EXTENDED_RELEASE_TABLET | ORAL | Status: AC
  Administered 2020-01-03 (×2): 40 meq via ORAL
  Filled 2020-01-03 (×2): qty 2

## 2020-01-03 MED ORDER — PRO-STAT SUGAR FREE PO LIQD
30.0000 mL | Freq: Two times a day (BID) | ORAL | Status: DC
  Administered 2020-01-03 – 2020-01-04 (×4): 30 mL via ORAL
  Filled 2020-01-03 (×5): qty 30

## 2020-01-03 MED ORDER — POTASSIUM CHLORIDE CRYS ER 20 MEQ PO TBCR
40.0000 meq | EXTENDED_RELEASE_TABLET | ORAL | Status: AC
  Administered 2020-01-03 (×2): 40 meq via ORAL
  Filled 2020-01-03 (×2): qty 2

## 2020-01-03 MED ORDER — ACETAMINOPHEN 325 MG PO TABS: 650.0000 mg | ORAL_TABLET | Freq: Four times a day (QID) | ORAL | Status: DC | PRN

## 2020-01-03 MED ORDER — SODIUM CHLORIDE 0.9 % IV SOLN: INTRAVENOUS | Status: DC

## 2020-01-03 MED ORDER — CHLORHEXIDINE GLUCONATE CLOTH 2 % EX PADS
6.0000 | MEDICATED_PAD | Freq: Every day | CUTANEOUS | Status: DC
  Administered 2020-01-03 – 2020-01-05 (×3): 6 via TOPICAL

## 2020-01-03 MED ORDER — POTASSIUM CHLORIDE CRYS ER 20 MEQ PO TBCR
40.0000 meq | EXTENDED_RELEASE_TABLET | Freq: Once | ORAL | Status: AC
  Administered 2020-01-03: 12:00:00 40 meq via ORAL
  Filled 2020-01-03: qty 2

## 2020-01-03 MED ORDER — HYDROCODONE-ACETAMINOPHEN 5-325 MG PO TABS: 1.0000 | ORAL_TABLET | ORAL | Status: DC | PRN

## 2020-01-03 MED ORDER — ADULT MULTIVITAMIN W/MINERALS CH
1.0000 | ORAL_TABLET | Freq: Every day | ORAL | Status: DC
  Administered 2020-01-03 – 2020-01-05 (×3): 1 via ORAL
  Filled 2020-01-03 (×3): qty 1

## 2020-01-03 MED ORDER — POTASSIUM CHLORIDE CRYS ER 20 MEQ PO TBCR
20.0000 meq | EXTENDED_RELEASE_TABLET | Freq: Every day | ORAL | Status: DC
  Administered 2020-01-03: 11:00:00 20 meq via ORAL
  Filled 2020-01-03: qty 1

## 2020-01-03 MED ORDER — THIAMINE HCL 100 MG PO TABS
100.0000 mg | ORAL_TABLET | Freq: Every day | ORAL | Status: DC
  Administered 2020-01-03 – 2020-01-05 (×3): 100 mg via ORAL
  Filled 2020-01-03 (×3): qty 1

## 2020-01-03 NOTE — Progress Notes (Signed)
MD paged about troponin of 64 from ED and most recent critical lab value of potassium level less than 2. Pt is still receiving IV KCl. Will wait for further instructions.

## 2020-01-03 NOTE — Progress Notes (Signed)
MD paged about critical lab serum osmolality 241 and critical EKG result QTc of 568. MD placed order for another EKG in am and discontinued Zofran.

## 2020-01-03 NOTE — Evaluation (Signed)
Physical Therapy Evaluation Patient Details Name: Jackie Odonnell MRN: [id number] DOB: 03-26-1961 Today's Date: 01/03/2020   History of Present Illness  This 59 year old female was admitted with multiple complaints including nausea, vomiting, diarrhea and bone pain.  She has a PMH of schizoaffective disorder, HTN, and tobacco abuse  Clinical Impression   Pt presents with increased time and effort to mobilize, fair standing balance with unsteadiness noted dynamically, near-syncopal feelings with orthostasis noted, and decreased activity tolerance. Pt to benefit from acute PT to address deficits. Pt required supervision to min assist today for bed mobility and transfer to standing, pt unable to progress to ambulation due to feeling "like I am going to fall out"; BP below. PT recommending HHPT with assist from sister upon d/c, at baseline pt is independent in mobility and ADLs, and sister only assists her by driving her to the grocery store. PT to progress mobility as tolerated, and will continue to follow acutely.    BP pre-stand 113/93, BP post-stand 114/42   Follow Up Recommendations Home health PT;Supervision/Assistance - 24 hour    Equipment Recommendations  Other (comment)(TBD)    Recommendations for Other Services       Precautions / Restrictions Precautions Precautions: Fall Precaution Comments: watch BP Restrictions Weight Bearing Restrictions: No      Mobility  Bed Mobility Overal bed mobility: Needs Assistance Bed Mobility: Supine to Sit;Sit to Supine     Supine to sit: Supervision Sit to supine: Supervision   General bed mobility comments: supervision for lines  Transfers Overall transfer level: Needs assistance Equipment used: None Transfers: Sit to/from Stand Sit to Stand: Min guard;Min assist Stand pivot transfers: Min assist       General transfer comment: min assist to steady, HHA for pt to self-steady. Pt able to take 1 step forward and 1 step back  only due to feeling like "I am going to drop out". BP pre-stand 113/93, BP post-stand 114/42  Ambulation/Gait             General Gait Details: unable to attempt, pt-reported head heaviness vs lightheadedness (pt with difficulty describing)  Stairs            Wheelchair Mobility    Modified Rankin (Stroke Patients Only)       Balance Overall balance assessment: Needs assistance Sitting-balance support: No upper extremity supported;Feet supported Sitting balance-Leahy Scale: Good     Standing balance support: Single extremity supported Standing balance-Leahy Scale: Fair Standing balance comment: mild unsteadiness noted                             Pertinent Vitals/Pain Pain Assessment: No/denies pain    Home Living Family/patient expects to be discharged to:: Private residence Living Arrangements: Alone Available Help at Discharge: Family;Available PRN/intermittently Type of Home: Apartment Home Access: Stairs to enter   Entrance Stairs-Number of Steps: 1 big Home Layout: One level Home Equipment: None      Prior Function Level of Independence: Independent         Comments: sister takes her grocery shopping     Hand Dominance        Extremity/Trunk Assessment   Upper Extremity Assessment Upper Extremity Assessment: Defer to OT evaluation    Lower Extremity Assessment Lower Extremity Assessment: Overall WFL for tasks assessed    Cervical / Trunk Assessment Cervical / Trunk Assessment: Normal  Communication   Communication: No difficulties  Cognition Arousal/Alertness: Awake/alert Behavior  During Therapy: WFL for tasks assessed/performed Overall Cognitive Status: No family/caregiver present to determine baseline cognitive functioning                                 General Comments: easily distracted. follows one step commands with multimodal cues at times, cues for safety      General Comments General  comments (skin integrity, edema, etc.): Pt describes being "heavy headed"    Exercises     Assessment/Plan    PT Assessment Patient needs continued PT services  PT Problem List Decreased mobility;Decreased safety awareness;Decreased activity tolerance;Decreased balance;Decreased knowledge of use of DME;Cardiopulmonary status limiting activity       PT Treatment Interventions DME instruction;Therapeutic activities;Gait training;Therapeutic exercise;Patient/family education;Balance training;Stair training;Functional mobility training    PT Goals (Current goals can be found in the Care Plan section)  Acute Rehab PT Goals Patient Stated Goal: be more steady PT Goal Formulation: With patient Time For Goal Achievement: 01/17/20 Potential to Achieve Goals: Good    Frequency Min 3X/week   Barriers to discharge        Co-evaluation               AM-PAC PT "6 Clicks" Mobility  Outcome Measure Help needed turning from your back to your side while in a flat bed without using bedrails?: None Help needed moving from lying on your back to sitting on the side of a flat bed without using bedrails?: None Help needed moving to and from a bed to a chair (including a wheelchair)?: A Little Help needed standing up from a chair using your arms (e.g., wheelchair or bedside chair)?: A Little Help needed to walk in hospital room?: A Lot Help needed climbing 3-5 steps with a railing? : A Lot 6 Click Score: 18    End of Session   Activity Tolerance: Patient limited by fatigue;Treatment limited secondary to medical complications (Comment)(dizziness (?)) Patient left: in bed;with call bell/phone within reach;with bed alarm set Nurse Communication: Mobility status PT Visit Diagnosis: Difficulty in walking, not elsewhere classified (R26.2);Unsteadiness on feet (R26.81)    Time: 0174-9449 PT Time Calculation (min) (ACUTE ONLY): 11 min   Charges:   PT Evaluation $PT Eval Low Complexity: 1  Low          Talik Casique E, PT Acute Rehabilitation Services Pager [phone]  Office [phone]   Rayan Dyal D Despina Hidden 01/03/2020, 2:37 PM

## 2020-01-03 NOTE — Progress Notes (Signed)
Initial Nutrition Assessment  DOCUMENTATION CODES:   Obesity unspecified  INTERVENTION:  - will order Ensure Max once/day, each supplement provides 150 kcal and 30 grams protein. - will order 30 mL Prostat BID, each supplement provides 100 kcal and 15 grams of protein. - will order daily multivitamin with minerals.    NUTRITION DIAGNOSIS:   Increased nutrient needs related to acute illness as evidenced by estimated needs.  GOAL:   Patient will meet greater than or equal to 90% of their needs  MONITOR:   PO intake, Supplement acceptance, Labs, Weight trends  REASON FOR ASSESSMENT:   Consult Assessment of nutrition requirement/status  ASSESSMENT:   59 y.o. female with medical history significant of schizoaffective disorder, tobacco abuse, and hypokalemia. She presented to the ED with 3 day hx of generalized weakness, N/V/D, and bone pain. She also reported having a fever a few days PTA. She has not been compliant with medications 2/2 vomiting. Patient continues to smoke.  Diet advanced from NPO to Heart Healthy at 0035. No intakes documented since that time. Patient reports that breakfast was ordered late and has not yet arrived. She is feeling very hungry this morning and denies any nausea or abdominal discomfort at this time.  She reports she has not eaten in 3 days d/t symptoms. She typically has a very good appetite and eats 3 meals/day which consists of a breakfast sandwich for breakfast and a meat, vegetable, and other side for lunch and dinner meals. She does the cooking at home. She denies any chewing or swallowing issues.   Per chart review, current weight is 190 lb and weight on 01/06/19 was 191 lb. Stable wt x1 year.   Per notes: - hyponatremia - acute encephalopathy--improving - hypokalemia - paranoid schizophrenia - elevated LFTs - dehydration   Labs reviewed; Na: 123 mmol/l, K: <2 mmol/l, Cl: 75 mmol/l, Ca: 8.3 mg/dl, Phos: 1.7 mg/dl, LFTs  elevated. Medications reviewed; 1 mg folvite/day, 10 mEq IV KCl x7 runs 1/11, 20 mEq Klor-Con/day, 500 mg KPhos TID, 100 mg oral thiamine/day.   IVF; NS @ 100 ml/hr.     NUTRITION - FOCUSED PHYSICAL EXAM:  completed; no muscle or fat wasting, no edema noted at this time.  Diet Order:   Diet Order            Diet Heart Room service appropriate? Yes; Fluid consistency: Thin  Diet effective now              EDUCATION NEEDS:   No education needs have been identified at this time  Skin:  Skin Assessment: Reviewed RN Assessment  Last BM:  1/12  Height:   Ht Readings from Last 1 Encounters:  01/02/20 5' 6.5" (1.689 m)    Weight:   Wt Readings from Last 1 Encounters:  01/02/20 86.2 kg    Ideal Body Weight:  60.2 kg  BMI:  Body mass index is 30.21 kg/m.  Estimated Nutritional Needs:   Kcal:  2000-2200 kcal  Protein:  100-110 grams  Fluid:  >/= 2.2 L/day     Trenton Gammon, MS, RD, LDN, [facility] Inpatient Clinical Dietitian Pager # [phone] After hours/weekend pager # [phone]

## 2020-01-03 NOTE — Progress Notes (Signed)
7125: Per conversation with MD Gherghe hold Metoprolol AM dose d/t hypotension.   2712Tacey Odonnell with lab notified patient has Q6H BMP. Will come to unit to draw labs.  1036: MD Gherghe notified patient K+ redraw 2.3  1343: Spoke with MD Elvera Lennox -  Will await K+ redraw results, will not restart psych medications at this time. Continue to monitor patient.

## 2020-01-03 NOTE — Progress Notes (Addendum)
PROGRESS NOTE  Jackie Odonnell ZJQ:[id number] DOB: Jul 12, 1961 DOA: 01/02/2020 PCP: Marrian Salvage, FNP   LOS: 1 day   Brief Narrative / Interim history: 59 year old female with history of schizoaffective disorder, tobacco abuse, chronic hypokalemia presents to the hospital with chief complaint of generalized weakness, nausea vomiting and diarrhea over the last several weeks but worse in the past week. She tells me she has not been able to take her home medications due to vomiting. She also tells me that she ran out of her potassium, Haldol, Cogentin and when she tried to get them refilled she says that she was told to have a visit arranged with the PCP. She was found to be hyponatremic, profoundly hypokalemic and was admitted to the hospital  Subjective / 24h Interval events: Tells me she feels weak this morning, and has had recurrent falls at home. Denies any abdominal pain, vomiting. Has been having loose stools since hospitalized. Denies any chest pain or shortness of breath.  Assessment & Plan: Principal Problem Hyponatremia, likely hypovolemic hyponatremia -She is also hypochloremic. She was placed on IV fluids and sodium is improving, continue fluids and continue to monitor -TSH normal at 0.6 -Hold hydrochlorothiazide -Decrease fluid rate to prevent overaggressive correction as Na went 117 >> 123 >> 128 within < 24h  Active Problems Profound hypokalemia -Likely in the setting of GI losses with nausea, vomiting diarrhea -Replete aggressively, she does have EKG changes with flattened inverted T waves  Nausea, vomiting, diarrhea -For the past couple of weeks, worse in the last week. No recent antibiotic exposure -Nausea and vomiting have resolved and currently able to eat but still had persistent diarrhea -Rule out C. difficile given type VII liquid stools  Hypertension -Borderline blood pressure this morning, hold metoprolol today, resume tomorrow  Schizophrenia,  paranoid -She has prolonged QT and hold Haldol and Cogentin for now, repeat EKG tomorrow morning and if QTC is improving resume -She has ran out of Haldol and Cogentin for several weeks and has been unable to take refills   History of asthma -Stable, no wheezing  Elevated LFTs -Mild, repeat tomorrow   Scheduled Meds: . Chlorhexidine Gluconate Cloth  6 each Topical Daily  . enoxaparin (LOVENOX) injection  40 mg Subcutaneous Q24H  . feeding supplement (PRO-STAT SUGAR FREE 64)  30 mL Oral BID  . folic acid  1 mg Oral Daily  . metoprolol tartrate  100 mg Oral BID  . multivitamin with minerals  1 tablet Oral Daily  . potassium chloride SA  20 mEq Oral Daily  . potassium phosphate (monobasic)  500 mg Oral TID WC & HS  . Ensure Max Protein  11 oz Oral Daily  . thiamine  100 mg Oral Daily   Continuous Infusions: . sodium chloride    . sodium chloride 100 mL/hr at 01/03/20 0042   PRN Meds:.acetaminophen **OR** acetaminophen, HYDROcodone-acetaminophen  DVT prophylaxis: Lovenox Code Status: Full code Family Communication: d/w patient  Disposition Plan: home when ready   Consultants:  None   Procedures:  None   Microbiology  MRSA PCR screening 1/12-negative Respiratory panel by RT-PCR flu/Covid 1/11-negative C. difficile 1/12-pending  Antimicrobials: None   Objective: Vitals:   01/03/20 0700 01/03/20 0749 01/03/20 0800 01/03/20 0900  BP:  (!) 98/52 109/64 (!) 100/57  Pulse: 84 76 75 87  Resp: 12 14 18    Temp:   (!) 97.4 F (36.3 C)   TempSrc:   Oral   SpO2: 99% 100% 96% 97%  Weight:  Height:        Intake/Output Summary (Last 24 hours) at 01/03/2020 1005 Last data filed at 01/03/2020 1601 Gross per 24 hour  Intake 1781.35 ml  Output 2100 ml  Net -318.65 ml   Filed Weights   01/02/20 1446  Weight: 86.2 kg    Examination:  Constitutional: NAD Eyes: no scleral icterus ENMT: Mucous membranes are dry.  Neck: normal, supple Respiratory: clear to  auscultation bilaterally, no wheezing, no crackles. Normal respiratory effort. No accessory muscle use.  Cardiovascular: Regular rate and rhythm, no murmurs / rubs / gallops. No LE edema. Good peripheral pulses Abdomen: non distended, no tenderness. Bowel sounds positive.  Musculoskeletal: no clubbing / cyanosis.  Skin: no rashes Neurologic: CN 2-12 grossly intact. Strength 5/5 in all 4.  Psychiatric: Normal judgment and insight. Alert and oriented x 3. Normal mood.    Data Reviewed: I have independently reviewed following labs and imaging studies   CBC: Recent Labs  Lab 01/02/20 1533 01/02/20 1756 01/03/20 0126  WBC 12.9* 12.2* 10.2  HGB 14.2 13.2 12.9  HCT 42.6 39.4 38.9  MCV 70.0* 69.7* 70.9*  PLT 297 270 262   Basic Metabolic Panel: Recent Labs  Lab 01/02/20 1533 01/02/20 1756 01/03/20 0126  NA 117* 117* 123*  K <2.0* <2.0* <2.0*  CL <65* <65* 75*  CO2 35* 38* 33*  GLUCOSE 100* 102* 87  BUN 7 8 7   CREATININE 0.79 0.91 0.74  CALCIUM 8.3* 8.3* 8.3*  MG  --  2.1 2.2  PHOS  --  2.1* 1.7*   Liver Function Tests: Recent Labs  Lab 01/02/20 1533 01/02/20 1756 01/03/20 0126  AST 95* 86* 71*  ALT 49* 46* 46*  ALKPHOS 106 100 91  BILITOT 1.1 1.0 0.7  PROT 6.5 6.3* 6.0*  ALBUMIN 3.4* 3.4* 3.2*   Coagulation Profile: Recent Labs  Lab 01/03/20 0126  INR 0.9   HbA1C: No results for input(s): HGBA1C in the last 72 hours. CBG: No results for input(s): GLUCAP in the last 168 hours.  Recent Results (from the past 240 hour(s))  Respiratory Panel by RT PCR (Flu A&B, Covid) - Nasopharyngeal Swab     Status: None   Collection Time: 01/02/20  9:26 PM   Specimen: Nasopharyngeal Swab  Result Value Ref Range Status   SARS Coronavirus 2 by RT PCR NEGATIVE NEGATIVE Final    Comment: (NOTE) SARS-CoV-2 target nucleic acids are NOT DETECTED. The SARS-CoV-2 RNA is generally detectable in upper respiratoy specimens during the acute phase of infection. The  lowest concentration of SARS-CoV-2 viral copies this assay can detect is 131 copies/mL. A negative result does not preclude SARS-Cov-2 infection and should not be used as the sole basis for treatment or other patient management decisions. A negative result may occur with  improper specimen collection/handling, submission of specimen other than nasopharyngeal swab, presence of viral mutation(s) within the areas targeted by this assay, and inadequate number of viral copies (<131 copies/mL). A negative result must be combined with clinical observations, patient history, and epidemiological information. The expected result is Negative. Fact Sheet for Patients:  03/01/20 Fact Sheet for Healthcare Providers:  https://www.moore.com/ This test is not yet ap proved or cleared by the https://www.young.biz/ FDA and  has been authorized for detection and/or diagnosis of SARS-CoV-2 by FDA under an Emergency Use Authorization (EUA). This EUA will remain  in effect (meaning this test can be used) for the duration of the COVID-19 declaration under Section 564(b)(1) of the Act, 21  U.S.C. section 360bbb-3(b)(1), unless the authorization is terminated or revoked sooner.    Influenza A by PCR NEGATIVE NEGATIVE Final   Influenza B by PCR NEGATIVE NEGATIVE Final    Comment: (NOTE) The Xpert Xpress SARS-CoV-2/FLU/RSV assay is intended as an aid in  the diagnosis of influenza from Nasopharyngeal swab specimens and  should not be used as a sole basis for treatment. Nasal washings and  aspirates are unacceptable for Xpert Xpress SARS-CoV-2/FLU/RSV  testing. Fact Sheet for Patients: https://www.moore.com/ Fact Sheet for Healthcare Providers: https://www.young.biz/ This test is not yet approved or cleared by the [place] FDA and  has been authorized for detection and/or diagnosis of SARS-CoV-2 by  FDA under an Emergency  Use Authorization (EUA). This EUA will remain  in effect (meaning this test can be used) for the duration of the  Covid-19 declaration under Section 564(b)(1) of the Act, 21  U.S.C. section 360bbb-3(b)(1), unless the authorization is  terminated or revoked. Performed at [facility], [address], [place], [place] [address]   MRSA PCR Screening     Status: None   Collection Time: 01/03/20  1:16 AM   Specimen: Nasal Mucosa; Nasopharyngeal  Result Value Ref Range Status   MRSA by PCR NEGATIVE NEGATIVE Final    Comment:        The GeneXpert MRSA Assay (FDA approved for NASAL specimens only), is one component of a comprehensive MRSA colonization surveillance program. It is not intended to diagnose MRSA infection nor to guide or monitor treatment for MRSA infections. Performed at [facility], [address] [place], [place] [address]      Radiology Studies: CT HEAD WO CONTRAST  Result Date: 01/02/2020 CLINICAL DATA:  Encephalopathy EXAM: CT HEAD WITHOUT CONTRAST TECHNIQUE: Contiguous axial images were obtained from the base of the skull through the vertex without intravenous contrast. COMPARISON:  None. FINDINGS: Brain: No acute intracranial abnormality. Specifically, no hemorrhage, hydrocephalus, mass lesion, acute infarction, or significant intracranial injury. Vascular: No hyperdense vessel or unexpected calcification. Skull: No acute calvarial abnormality. Sinuses/Orbits: Visualized paranasal sinuses and mastoids clear. Orbital soft tissues unremarkable. Other: None IMPRESSION: Normal study. Electronically Signed   By: Charlett Nose M.D.   On: 01/02/2020 22:30   DG Chest Port 1 View  Result Date: 01/02/2020 CLINICAL DATA:  Dizziness, fatigue EXAM: PORTABLE CHEST 1 VIEW COMPARISON:  01/30/2015 FINDINGS: The heart size and mediastinal contours are within normal limits. Both lungs are clear. The visualized skeletal structures are unremarkable.  IMPRESSION: No active disease. Electronically Signed   By: Duanne Guess D.O.   On: 01/02/2020 18:00   Pamella Pert, MD, PhD [facility]  Between 7 am - 7 pm I am available, please contact me via Amion or Securechat  Between 7 pm - 7 am I am not available, please contact night coverage MD/APP via Amion

## 2020-01-03 NOTE — Progress Notes (Signed)
Order for troponin blood draw was order by MD

## 2020-01-03 NOTE — Evaluation (Signed)
Occupational Therapy Evaluation Patient Details Name: Jackie Odonnell MRN: [id number] DOB: 1961-11-10 Today's Date: 01/03/2020    History of Present Illness This 59 year old female was admitted with multiple complaints including nausea, vomiting, diarrhea and bone pain.  She has a PMH of schizoaffective disorder and tobacco abuse   Clinical Impression   Pt was admitted for the above. At baseline, she is independent with basic adls and her sister takes her grocery shopping.  Pt needs min guard to min A at this time. She feels unsteady/heavy headed but did not lose balance.  More assistance given with SPT than standing ADL activities. Will follow in acute setting with supervision level goals    Follow Up Recommendations  Home health OT;Supervision/Assistance - 24 hour(if needed)    Equipment Recommendations  None recommended by OT(likely)    Recommendations for Other Services       Precautions / Restrictions Precautions Precautions: Fall Precaution Comments: watch BP Restrictions Weight Bearing Restrictions: No      Mobility Bed Mobility Overal bed mobility: Needs Assistance             General bed mobility comments: supervision for lines  Transfers Overall transfer level: Needs assistance Equipment used: None Transfers: Sit to/from Stand;Stand Pivot Transfers Sit to Stand: Min guard;Min assist Stand pivot transfers: Min assist       General transfer comment: light steadying assistance at times    Balance                                           ADL either performed or assessed with clinical judgement   ADL Overall ADL's : Needs assistance/impaired Eating/Feeding: Independent   Grooming: Set up;Wash/dry hands   Upper Body Bathing: Set up   Lower Body Bathing: Min guard;Minimal assistance   Upper Body Dressing : Minimal assistance Upper Body Dressing Details (indicate cue type and reason): lines Lower Body Dressing: Min guard    Toilet Transfer: Minimal assistance;Stand-pivot;BSC   Toileting- Clothing Manipulation and Hygiene: Min guard;Minimal assistance;Sit to/from stand         General ADL Comments: min guard for safety; min A steadying for SPT and assist for lines.  Pt feels unsteady but did not have LOB     Vision         Perception     Praxis      Pertinent Vitals/Pain Pain Assessment: No/denies pain     Hand Dominance     Extremity/Trunk Assessment Upper Extremity Assessment Upper Extremity Assessment: Overall WFL for tasks assessed           Communication Communication Communication: No difficulties   Cognition Arousal/Alertness: Awake/alert Behavior During Therapy: WFL for tasks assessed/performed Overall Cognitive Status: No family/caregiver present to determine baseline cognitive functioning                                 General Comments: easily distracted. follows one step commands with multimodal cues at times, cues for safety   General Comments  Pt describes being "heavy headed"    Exercises     Shoulder Instructions      Home Living Family/patient expects to be discharged to:: Private residence Living Arrangements: Alone     Home Access: Stairs to enter Entrance Stairs-Number of Steps: 1 big   Home Layout: One level  Bathroom Shower/Tub: Doctor, general practice: None          Prior Functioning/Environment Level of Independence: Independent        Comments: sister takes her grocery shopping        OT Problem List: Impaired balance (sitting and/or standing);Decreased activity tolerance;Decreased cognition;Decreased safety awareness      OT Treatment/Interventions: Self-care/ADL training;DME and/or AE instruction;Therapeutic activities;Patient/family education;Balance training    OT Goals(Current goals can be found in the care plan section) Acute Rehab OT Goals Patient Stated Goal:  none stated OT Goal Formulation: With patient Time For Goal Achievement: 01/17/20 Potential to Achieve Goals: Good ADL Goals Pt Will Transfer to Toilet: with supervision;ambulating;regular height toilet Pt Will Perform Tub/Shower Transfer: with supervision;ambulating Additional ADL Goal #1: pt will gather adl supplies at supervision level and complete adl withoutany safety cues  OT Frequency: Min 2X/week   Barriers to D/C:            Co-evaluation              AM-PAC OT "6 Clicks" Daily Activity     Outcome Measure Help from another person eating meals?: None Help from another person taking care of personal grooming?: A Little Help from another person toileting, which includes using toliet, bedpan, or urinal?: A Little Help from another person bathing (including washing, rinsing, drying)?: A Little Help from another person to put on and taking off regular upper body clothing?: A Little Help from another person to put on and taking off regular lower body clothing?: A Little 6 Click Score: 19   End of Session    Activity Tolerance: Patient tolerated treatment well Patient left: in bed;with call bell/phone within reach;with bed alarm set  OT Visit Diagnosis: Unsteadiness on feet (R26.81)                Time: 5732-2025 OT Time Calculation (min): 20 min Charges:  OT General Charges $OT Visit: 1 Visit OT Evaluation $OT Eval Low Complexity: 1 Low  Freada Twersky S, OTR/L Acute Rehabilitation Services 01/03/2020  Wright Gravely 01/03/2020, 12:52 PM

## 2020-01-04 LAB — CBC
HCT: 36 % (ref 36.0–46.0)
Hemoglobin: 11.5 g/dL — ABNORMAL LOW (ref 12.0–15.0)
MCH: 23.2 pg — ABNORMAL LOW (ref 26.0–34.0)
MCHC: 31.9 g/dL (ref 30.0–36.0)
MCV: 72.6 fL — ABNORMAL LOW (ref 80.0–100.0)
Platelets: 281 10*3/uL (ref 150–400)
RBC: 4.96 MIL/uL (ref 3.87–5.11)
RDW: 14.6 % (ref 11.5–15.5)
WBC: 9.9 10*3/uL (ref 4.0–10.5)
nRBC: 0 % (ref 0.0–0.2)

## 2020-01-04 LAB — COMPREHENSIVE METABOLIC PANEL
ALT: 36 U/L (ref 0–44)
AST: 34 U/L (ref 15–41)
Albumin: 3 g/dL — ABNORMAL LOW (ref 3.5–5.0)
Alkaline Phosphatase: 82 U/L (ref 38–126)
Anion gap: 9 (ref 5–15)
BUN: 10 mg/dL (ref 6–20)
CO2: 31 mmol/L (ref 22–32)
Calcium: 8.4 mg/dL — ABNORMAL LOW (ref 8.9–10.3)
Chloride: 91 mmol/L — ABNORMAL LOW (ref 98–111)
Creatinine, Ser: 0.66 mg/dL (ref 0.44–1.00)
GFR calc Af Amer: >60 mL/min (ref >60)
GFR calc non Af Amer: >60 mL/min (ref >60)
Glucose, Bld: 85 mg/dL (ref 70–99)
Potassium: 3.6 mmol/L (ref 3.5–5.1)
Sodium: 131 mmol/L — ABNORMAL LOW (ref 135–145)
Total Bilirubin: 0.4 mg/dL (ref 0.3–1.2)
Total Protein: 5.5 g/dL — ABNORMAL LOW (ref 6.5–8.1)

## 2020-01-04 MED ORDER — HALOPERIDOL 5 MG PO TABS
10.0000 mg | ORAL_TABLET | Freq: Two times a day (BID) | ORAL | Status: DC
  Administered 2020-01-04 – 2020-01-05 (×3): 10 mg via ORAL
  Filled 2020-01-04: qty 5
  Filled 2020-01-04 (×2): qty 2
  Filled 2020-01-04: qty 5
  Filled 2020-01-04: qty 2

## 2020-01-04 MED ORDER — BENZTROPINE MESYLATE 0.5 MG PO TABS
0.5000 mg | ORAL_TABLET | Freq: Two times a day (BID) | ORAL | Status: DC
  Administered 2020-01-04 – 2020-01-05 (×3): 0.5 mg via ORAL
  Filled 2020-01-04 (×4): qty 1

## 2020-01-04 MED ORDER — BUSPIRONE HCL 10 MG PO TABS
10.0000 mg | ORAL_TABLET | Freq: Three times a day (TID) | ORAL | Status: DC
  Administered 2020-01-04 – 2020-01-05 (×4): 10 mg via ORAL
  Filled 2020-01-04 (×4): qty 1

## 2020-01-04 MED ORDER — LAMOTRIGINE 100 MG PO TABS
100.0000 mg | ORAL_TABLET | Freq: Two times a day (BID) | ORAL | Status: DC
  Administered 2020-01-04 – 2020-01-05 (×3): 100 mg via ORAL
  Filled 2020-01-04 (×3): qty 1

## 2020-01-04 MED ORDER — POTASSIUM CHLORIDE CRYS ER 20 MEQ PO TBCR
40.0000 meq | EXTENDED_RELEASE_TABLET | Freq: Two times a day (BID) | ORAL | Status: DC
  Administered 2020-01-04 – 2020-01-05 (×3): 40 meq via ORAL
  Filled 2020-01-04 (×3): qty 2

## 2020-01-04 NOTE — Progress Notes (Signed)
PROGRESS NOTE    Jackie Odonnell  OIL:[id number] DOB: 06/09/1961 DOA: 01/02/2020 PCP: Olive Bass, FNP    Brief Narrative:  59 year old female with history of a schizoaffective disorder, chronic hypokalemia who presented to the emergency room with generalized weakness, nausea, vomiting and diarrhea of more than 1 week.  In the emergency room, she was found profoundly hyponatremic sodium of 117, hypokalemic and multiple electrolyte abnormalities.   Assessment & Plan:   Active Problems:   Essential hypertension   Asthma   Hyponatremia   Hypokalemia   Tobacco abuse   Schizophrenia, paranoid (HCC)   Prolonged QT interval  Hypovolemic hyponatremia: Treated with isotonic fluid with gradual improvement.  Her sodium level is appropriately improving.  [id number].  Fluids discontinued.  Diarrhea improved.  Recheck tomorrow morning to ensure stabilization. TSH normal.  Discontinue hydrochlorothiazide.  Hypokalemia: Severe and persistent.  Replace aggressively.  Recheck in the morning including magnesium and phosphorus.  Paranoid schizophrenia: On multiple regimen at home.  QTC was prolonged with severe electrolyte abnormalities.  EKG today with QTC of 450.  Resume all her home medications including Haldol and Cogentin.  Hypertension: Patient was dehydrated and hypovolemic with use of lisinopril and hydrochlorothiazide.  Blood pressures are improving.  She can continue beta-blockers, will discontinue hydrochlorothiazide and lisinopril.  Check orthostatic and mobilize.  Diarrhea: No loose stool for last 24 hours.  C. difficile negative.  Discontinue enteric precautions, as patient has no stool for last 24 hours and diagnostic yield will be none.  Traumatic rhabdomyolysis: CK was more than 2000.  Treated with fluid.  We will recheck tomorrow morning.   DVT prophylaxis: Lovenox subcu Code Status: Full code Family Communication: None Disposition Plan: Home tomorrow if  remains stable.  Transfer to MedSurg bed.   Consultants:   None  Procedures:   None  Antimicrobials:   None   Subjective: Patient seen and examined.  No overnight events.  Denies any nausea vomiting or diarrhea.  No bowel movement for the last 24 hours.  Denies any abdominal pain.  She thinks he did okay with therapies and is confident to go home by herself.  Objective: Vitals:   01/03/20 1950 01/03/20 2151 01/04/20 0000 01/04/20 0325  BP: 134/69 (!) 149/87 135/78 (!) 148/78  Pulse: 97 97 84   Resp: 18  (!) 21   Temp: 98.1 F (36.7 C)  98 F (36.7 C) 98.4 F (36.9 C)  TempSrc: Oral  Oral Oral  SpO2: 99%  95%   Weight:      Height:        Intake/Output Summary (Last 24 hours) at 01/04/2020 0838 Last data filed at 01/04/2020 0600 Gross per 24 hour  Intake 466.16 ml  Output 2800 ml  Net -2333.84 ml   Filed Weights   01/02/20 1446  Weight: 86.2 kg    Examination:  General exam: Appears calm and comfortable, not in any distress. Respiratory system: Clear to auscultation. Respiratory effort normal. Cardiovascular system: S1 & S2 heard, RRR. No JVD, murmurs, rubs, gallops or clicks. No pedal edema. Gastrointestinal system: Abdomen is nondistended, soft and nontender. No organomegaly or masses felt. Normal bowel sounds heard. Central nervous system: Alert and oriented. No focal neurological deficits. Extremities: Symmetric 5 x 5 power. Skin: No rashes, lesions or ulcers Psychiatry: Judgement and insight appear normal. Mood & affect appropriate.  She has some delayed response, however denies any delusions, hallucinations or suicidal ideations.    Data Reviewed: I have personally reviewed following labs and  imaging studies  CBC: Recent Labs  Lab 01/02/20 1533 01/02/20 1756 01/03/20 0126 01/04/20 0639  WBC 12.9* 12.2* 10.2 9.9  HGB 14.2 13.2 12.9 11.5*  HCT 42.6 39.4 38.9 36.0  MCV 70.0* 69.7* 70.9* 72.6*  PLT 297 270 262 281   Basic Metabolic  Panel: Recent Labs  Lab 01/02/20 1756 01/03/20 0126 01/03/20 0934 01/03/20 1518 01/03/20 2135 01/04/20 0639  NA 117* 123* 128* 133* 128* 131*  K <2.0* <2.0* 2.3* 2.9* 3.5 3.6  CL <65* 75* 82* 87* 89* 91*  CO2 38* 33* 34* 34* 30 31  GLUCOSE 102* 87 118* 99 92 85  BUN 8 7 6 8 11 10   CREATININE 0.91 0.74 0.69 0.78 0.72 0.66  CALCIUM 8.3* 8.3* 8.1* 8.6* 8.1* 8.4*  MG 2.1 2.2  --   --   --   --   PHOS 2.1* 1.7*  --   --   --   --    GFR: Estimated Creatinine Clearance: 85.7 mL/min (by C-G formula based on SCr of 0.66 mg/dL). Liver Function Tests: Recent Labs  Lab 01/02/20 1533 01/02/20 1756 01/03/20 0126 01/04/20 0639  AST 95* 86* 71* 34  ALT 49* 46* 46* 36  ALKPHOS 106 100 91 82  BILITOT 1.1 1.0 0.7 0.4  PROT 6.5 6.3* 6.0* 5.5*  ALBUMIN 3.4* 3.4* 3.2* 3.0*   Recent Labs  Lab 01/02/20 1533  LIPASE 23   No results for input(s): AMMONIA in the last 168 hours. Coagulation Profile: Recent Labs  Lab 01/03/20 0126  INR 0.9   Cardiac Enzymes: Recent Labs  Lab 01/02/20 1755  CKTOTAL 2,329*   BNP (last 3 results) No results for input(s): PROBNP in the last 8760 hours. HbA1C: No results for input(s): HGBA1C in the last 72 hours. CBG: No results for input(s): GLUCAP in the last 168 hours. Lipid Profile: No results for input(s): CHOL, HDL, LDLCALC, TRIG, CHOLHDL, LDLDIRECT in the last 72 hours. Thyroid Function Tests: Recent Labs    01/03/20 0126  TSH 0.631   Anemia Panel: Recent Labs    01/02/20 2126  FERRITIN 263   Sepsis Labs: Recent Labs  Lab 01/02/20 1755  PROCALCITON <0.10    Recent Results (from the past 240 hour(s))  Respiratory Panel by RT PCR (Flu A&B, Covid) - Nasopharyngeal Swab     Status: None   Collection Time: 01/02/20  9:26 PM   Specimen: Nasopharyngeal Swab  Result Value Ref Range Status   SARS Coronavirus 2 by RT PCR NEGATIVE NEGATIVE Final    Comment: (NOTE) SARS-CoV-2 target nucleic acids are NOT DETECTED. The SARS-CoV-2 RNA  is generally detectable in upper respiratoy specimens during the acute phase of infection. The lowest concentration of SARS-CoV-2 viral copies this assay can detect is 131 copies/mL. A negative result does not preclude SARS-Cov-2 infection and should not be used as the sole basis for treatment or other patient management decisions. A negative result may occur with  improper specimen collection/handling, submission of specimen other than nasopharyngeal swab, presence of viral mutation(s) within the areas targeted by this assay, and inadequate number of viral copies (<131 copies/mL). A negative result must be combined with clinical observations, patient history, and epidemiological information. The expected result is Negative. Fact Sheet for Patients:  03/01/20 Fact Sheet for Healthcare Providers:  https://www.moore.com/ This test is not yet ap proved or cleared by the https://www.young.biz/ FDA and  has been authorized for detection and/or diagnosis of SARS-CoV-2 by FDA under an Emergency Use Authorization (EUA).  This EUA will remain  in effect (meaning this test can be used) for the duration of the COVID-19 declaration under Section 564(b)(1) of the Act, 21 U.S.C. section 360bbb-3(b)(1), unless the authorization is terminated or revoked sooner.    Influenza A by PCR NEGATIVE NEGATIVE Final   Influenza B by PCR NEGATIVE NEGATIVE Final    Comment: (NOTE) The Xpert Xpress SARS-CoV-2/FLU/RSV assay is intended as an aid in  the diagnosis of influenza from Nasopharyngeal swab specimens and  should not be used as a sole basis for treatment. Nasal washings and  aspirates are unacceptable for Xpert Xpress SARS-CoV-2/FLU/RSV  testing. Fact Sheet for Patients: https://www.moore.com/ Fact Sheet for Healthcare Providers: https://www.young.biz/ This test is not yet approved or cleared by the [place] FDA and   has been authorized for detection and/or diagnosis of SARS-CoV-2 by  FDA under an Emergency Use Authorization (EUA). This EUA will remain  in effect (meaning this test can be used) for the duration of the  Covid-19 declaration under Section 564(b)(1) of the Act, 21  U.S.C. section 360bbb-3(b)(1), unless the authorization is  terminated or revoked. Performed at [facility], [address], [place], [place] [address]   MRSA PCR Screening     Status: None   Collection Time: 01/03/20  1:16 AM   Specimen: Nasal Mucosa; Nasopharyngeal  Result Value Ref Range Status   MRSA by PCR NEGATIVE NEGATIVE Final    Comment:        The GeneXpert MRSA Assay (FDA approved for NASAL specimens only), is one component of a comprehensive MRSA colonization surveillance program. It is not intended to diagnose MRSA infection nor to guide or monitor treatment for MRSA infections. Performed at [facility], [address], [place], [place] [address]   C difficile quick scan w PCR reflex     Status: None   Collection Time: 01/03/20  6:48 AM   Specimen: STOOL  Result Value Ref Range Status   C Diff antigen NEGATIVE NEGATIVE Final   C Diff toxin NEGATIVE NEGATIVE Final   C Diff interpretation No C. difficile detected.  Final    Comment: Performed at [facility], [address] [place], [place] [address]         Radiology Studies: CT HEAD WO CONTRAST  Result Date: 01/02/2020 CLINICAL DATA:  Encephalopathy EXAM: CT HEAD WITHOUT CONTRAST TECHNIQUE: Contiguous axial images were obtained from the base of the skull through the vertex without intravenous contrast. COMPARISON:  None. FINDINGS: Brain: No acute intracranial abnormality. Specifically, no hemorrhage, hydrocephalus, mass lesion, acute infarction, or significant intracranial injury. Vascular: No hyperdense vessel or unexpected calcification. Skull: No acute calvarial abnormality.  Sinuses/Orbits: Visualized paranasal sinuses and mastoids clear. Orbital soft tissues unremarkable. Other: None IMPRESSION: Normal study. Electronically Signed   By: Charlett Nose M.D.   On: 01/02/2020 22:30   DG Chest Port 1 View  Result Date: 01/02/2020 CLINICAL DATA:  Dizziness, fatigue EXAM: PORTABLE CHEST 1 VIEW COMPARISON:  01/30/2015 FINDINGS: The heart size and mediastinal contours are within normal limits. Both lungs are clear. The visualized skeletal structures are unremarkable. IMPRESSION: No active disease. Electronically Signed   By: Duanne Guess D.O.   On: 01/02/2020 18:00        Scheduled Meds: . benztropine  0.5 mg Oral BID  . busPIRone  10 mg Oral TID  . Chlorhexidine Gluconate Cloth  6 each Topical Daily  . enoxaparin (LOVENOX) injection  40 mg Subcutaneous Q24H  .  feeding supplement (PRO-STAT SUGAR FREE 64)  30 mL Oral BID  . folic acid  1 mg Oral Daily  . haloperidol  10 mg Oral BID  . lamoTRIgine  100 mg Oral BID  . metoprolol tartrate  100 mg Oral BID  . multivitamin with minerals  1 tablet Oral Daily  . potassium chloride SA  40 mEq Oral BID  . potassium phosphate (monobasic)  500 mg Oral TID WC & HS  . Ensure Max Protein  11 oz Oral Daily  . thiamine  100 mg Oral Daily   Continuous Infusions:   LOS: 2 days    Time spent: 30 minutes    Barb Merino, MD [facility] Pager [phone]

## 2020-01-04 NOTE — Progress Notes (Signed)
Patient arrives to room 1509 via wheelchair from ICU at this time.  Patient independent from chair to bed.  Patient alert and oriented x4 with no requests or concerns at this time

## 2020-01-04 NOTE — TOC Initial Note (Addendum)
Transition of Care [facility]) - Initial/Assessment Note    Patient Details  Name: Jackie Odonnell MRN: [id number] Date of Birth: October 02, 1961  [facility] CM/SW Contact:    Lia Hopping, [place] Phone Number: 01/04/2020, 10:18 AM  Clinical Narrative:                 CSW met with the patient with the patient at bedside to discuss home health options. Patient sitting up in the bed, alert and oriented. CSW explain  PT recommendation for Home Health PT/OT. Patient declines home health. She reports, " No, I don't need that I will be fine. I can walk." Patient reports she walks without assistance.  CSW inquired about help at home. Patient reports, "my sister will help if I need it."  Patient reports she can bathe and dress herself. Patient decline DME ambulation support.    Expected Discharge Plan: [place] Barriers to Discharge: No Barriers Identified   Patient Goals and CMS Choice Patient states their goals for this hospitalization and ongoing recovery are:: "Going Home" CMS Medicare.gov Compare Post Acute Care list provided to:: Patient Choice offered to / list presented to : Patient  Expected Discharge Plan and Services Expected Discharge Plan: [place] In-house Referral: Clinical Social Work Discharge Planning Services: CM Consult   Living arrangements for the past 2 months: [place]                                      Prior Living Arrangements/Services Living arrangements for the past 2 months: Single Family Home Lives with:: Self Patient language and need for interpreter reviewed:: No Do you feel safe going back to the place where you live?: Yes      Need for Family Participation in Patient Care: Yes (Comment) Care giver support system in place?: Yes (comment)(Sister -HCPOA)   Criminal Activity/Legal Involvement Pertinent to Current Situation/Hospitalization: No - Comment as needed  Activities of Daily  Living Home Assistive Devices/Equipment: None ADL Screening (condition at time of admission) Patient's cognitive ability adequate to safely complete daily activities?: Yes Is the patient deaf or have difficulty hearing?: No Does the patient have difficulty seeing, even when wearing glasses/contacts?: Yes Does the patient have difficulty concentrating, remembering, or making decisions?: No Patient able to express need for assistance with ADLs?: No Does the patient have difficulty dressing or bathing?: No Independently performs ADLs?: Yes (appropriate for developmental age) Does the patient have difficulty walking or climbing stairs?: No Weakness of Legs: None Weakness of Arms/Hands: None  Permission Sought/Granted Permission sought to share information with : Family Supports Permission granted to share information with : No              Emotional Assessment Appearance:: Appears stated age Attitude/Demeanor/Rapport: Engaged Affect (typically observed): Accepting, Calm Orientation: : Oriented to Self, Oriented to Place, Oriented to  Time, Oriented to Situation Alcohol / Substance Use: Tobacco Use Psych Involvement: No (comment)  Admission diagnosis:  Hypokalemia [E87.6] Hypochloremia [E87.8] Hyponatremia [E87.1] Patient Active Problem List   Diagnosis Date Noted  . Prolonged QT interval 01/03/2020  . Microcytosis 04/01/2016  . Chronic cough 11/07/2014  . Schizophrenia, paranoid ([place]) 08/09/2012  . Tobacco abuse 08/06/2012  . Hyponatremia 08/05/2012  . Hypokalemia 08/05/2012  . Suicidal ideation 08/05/2012  . Essential hypertension 04/25/2009  . Asthma 10/18/2007  . PNEUMONIA, HX OF 10/18/2007  PCP:  Marrian Salvage, [place] Pharmacy:   [facility] [place], [address] [place] [address] Phone: [phone] Fax: [phone]     Social Determinants of Health  (SDOH) Interventions    Readmission Risk Interventions No flowsheet data found.

## 2020-01-05 LAB — COMPREHENSIVE METABOLIC PANEL
ALT: 34 U/L (ref 0–44)
AST: 24 U/L (ref 15–41)
Albumin: 3.2 g/dL — ABNORMAL LOW (ref 3.5–5.0)
Alkaline Phosphatase: 74 U/L (ref 38–126)
Anion gap: 8 (ref 5–15)
BUN: 15 mg/dL (ref 6–20)
CO2: 28 mmol/L (ref 22–32)
Calcium: 8.8 mg/dL — ABNORMAL LOW (ref 8.9–10.3)
Chloride: 95 mmol/L — ABNORMAL LOW (ref 98–111)
Creatinine, Ser: 0.8 mg/dL (ref 0.44–1.00)
GFR calc Af Amer: >60 mL/min (ref >60)
GFR calc non Af Amer: >60 mL/min (ref >60)
Glucose, Bld: 101 mg/dL — ABNORMAL HIGH (ref 70–99)
Potassium: 4.4 mmol/L (ref 3.5–5.1)
Sodium: 131 mmol/L — ABNORMAL LOW (ref 135–145)
Total Bilirubin: 0.7 mg/dL (ref 0.3–1.2)
Total Protein: 5.7 g/dL — ABNORMAL LOW (ref 6.5–8.1)

## 2020-01-05 LAB — CBC WITH DIFFERENTIAL/PLATELET
Abs Immature Granulocytes: 0.04 10*3/uL (ref 0.00–0.07)
Basophils Absolute: 0.1 10*3/uL (ref 0.0–0.1)
Basophils Relative: 1 %
Eosinophils Absolute: 0.1 10*3/uL (ref 0.0–0.5)
Eosinophils Relative: 1 %
HCT: 35.2 % — ABNORMAL LOW (ref 36.0–46.0)
Hemoglobin: 11.2 g/dL — ABNORMAL LOW (ref 12.0–15.0)
Immature Granulocytes: 0 %
Lymphocytes Relative: 40 %
Lymphs Abs: 4 10*3/uL (ref 0.7–4.0)
MCH: 23 pg — ABNORMAL LOW (ref 26.0–34.0)
MCHC: 31.8 g/dL (ref 30.0–36.0)
MCV: 72.3 fL — ABNORMAL LOW (ref 80.0–100.0)
Monocytes Absolute: 1 10*3/uL (ref 0.1–1.0)
Monocytes Relative: 10 %
Neutro Abs: 4.9 10*3/uL (ref 1.7–7.7)
Neutrophils Relative %: 48 %
Platelets: 303 10*3/uL (ref 150–400)
RBC: 4.87 MIL/uL (ref 3.87–5.11)
RDW: 14.8 % (ref 11.5–15.5)
WBC: 10.1 10*3/uL (ref 4.0–10.5)
nRBC: 0 % (ref 0.0–0.2)

## 2020-01-05 LAB — PHOSPHORUS: Phosphorus: 1.8 mg/dL — ABNORMAL LOW (ref 2.5–4.6)

## 2020-01-05 LAB — CK: Total CK: 335 U/L — ABNORMAL HIGH (ref 38–234)

## 2020-01-05 LAB — MAGNESIUM: Magnesium: 1.9 mg/dL (ref 1.7–2.4)

## 2020-01-05 MED ORDER — LISINOPRIL 10 MG PO TABS: 10.0000 mg | ORAL_TABLET | Freq: Every day | ORAL | 11 refills | Status: DC

## 2020-01-05 MED ORDER — POTASSIUM PHOSPHATE MONOBASIC 500 MG PO TABS: 500.0000 mg | ORAL_TABLET | Freq: Three times a day (TID) | ORAL | 0 refills | Status: AC

## 2020-01-05 MED ORDER — LOPERAMIDE HCL 2 MG PO CAPS: 2.0000 mg | ORAL_CAPSULE | Freq: Four times a day (QID) | ORAL | Status: DC | PRN

## 2020-01-05 NOTE — Plan of Care (Signed)

## 2020-01-05 NOTE — Plan of Care (Signed)
  Problem: Education: Goal: Knowledge of General Education information will improve Description: Including pain rating scale, medication(s)/side effects and non-pharmacologic comfort measures 01/05/2020 1335 by Darreld Mclean, RN Outcome: Adequate for Discharge 01/05/2020 1016 by Darreld Mclean, RN Outcome: Progressing   Problem: Health Behavior/Discharge Planning: Goal: Ability to manage health-related needs will improve 01/05/2020 1335 by Darreld Mclean, RN Outcome: Adequate for Discharge 01/05/2020 1016 by Darreld Mclean, RN Outcome: Progressing   Problem: Clinical Measurements: Goal: Ability to maintain clinical measurements within normal limits will improve 01/05/2020 1335 by Darreld Mclean, RN Outcome: Adequate for Discharge 01/05/2020 1016 by Darreld Mclean, RN Outcome: Progressing Goal: Will remain free from infection 01/05/2020 1335 by Darreld Mclean, RN Outcome: Adequate for Discharge 01/05/2020 1016 by Darreld Mclean, RN Outcome: Progressing Goal: Diagnostic test results will improve 01/05/2020 1335 by Darreld Mclean, RN Outcome: Adequate for Discharge 01/05/2020 1016 by Darreld Mclean, RN Outcome: Progressing Goal: Respiratory complications will improve 01/05/2020 1335 by Darreld Mclean, RN Outcome: Adequate for Discharge 01/05/2020 1016 by Darreld Mclean, RN Outcome: Progressing Goal: Cardiovascular complication will be avoided 01/05/2020 1335 by Darreld Mclean, RN Outcome: Adequate for Discharge 01/05/2020 1016 by Darreld Mclean, RN Outcome: Progressing

## 2020-01-05 NOTE — Discharge Summary (Signed)
Physician Discharge Summary  Jackie Odonnell HQI:[id number] DOB: [birth year] DOA: 01/02/2020  PCP: Olive Bass, FNP  Admit date: 01/02/2020 Discharge date: 01/05/2020  Admitted From: home  Disposition:  Home   Recommendations for Outpatient Follow-up:  1. Follow up with PCP in 1-2 weeks 2. Please obtain BMP/CBC/ Magnesium and Phosphorus levels in one week  Home Health: Declined Equipment/Devices: Declined need  Discharge Condition: Stable CODE STATUS: Full code Diet recommendation: Low-salt diet  Discharge summary: 59 year old female with history of schizoaffective disorder, chronic hypokalemia who presented to the emergency room with generalized weakness, nausea, vomiting and diarrhea of more than 1 week.  In the emergency room, she was found profoundly hyponatremic sodium of 117, hypokalemic with potassium less than 2 and multiple electrolyte abnormalities.  Discharge Diagnoses:  Active Problems:   Essential hypertension   Asthma   Hyponatremia   Hypokalemia   Tobacco abuse   Schizophrenia, paranoid (HCC)   Prolonged QT interval  Hypochloremic hyponatremia: Due to ongoing diarrhea and use of hydrochlorothiazide.  Presented with sodium of 117, chloride less than 65, potassium less than 2. She was treated with isotonic fluid with improvement.  Currently off of any IV fluids per 48 hours with stable levels.  [phone] today.  Diarrhea has improved. Potassium was aggressively replaced and level is 4.5. Phosphorus is replaced and will send prescriptions for replacement for next 7 days. Potassium level is 4.5 today.  No more diarrhea.  She is on lisinopril.  Hydrochlorothiazide will be discontinued.  Going to discontinue scheduled potassium replacement that she takes.  Diarrhea: Cause unknown.  Patient explained ongoing for about 7 days.  No diarrhea for last 2 days in the hospital.  Probably self-limiting.  C. difficile was negative.  Hypertension: Presented  with dehydration and hyponatremia.  She will continue metoprolol.  Will discontinue hydrochlorothiazide in light of concomitant use of other antidepressants with risk of hyponatremia.  Discontinued combination lisinopril hydrochlorothiazide and prescribed lisinopril 10 mg.  Blood pressures are stable.  Paranoid schizophrenia: On multiple regimen at home.  QTC prolongation was reported with severe electrolyte abnormalities, however after replacement of electrolytes, repeat EKG today shows QTC of 450.  She is able to go back on Haldol and Cogentin.  CK was 2000, treated with fluid and improvement.  Patient has adequately improved.  Currently with no nausea vomiting or diarrhea.  Electrolytes have been replaced.  Mobilized with PT OT, suggested home health PT OT, however she declined.  She lives at home by herself, her sister is around to help her if needed.  Patient understands the discharge plans and modification of medications.  Discharge Instructions  Discharge Instructions    Call MD for:  extreme fatigue   Complete by: As directed    Call MD for:  persistant nausea and vomiting   Complete by: As directed    Diet - low sodium heart healthy   Complete by: As directed    Discharge instructions   Complete by: As directed    You can use 1-2 times imodium if you have loose stool   Increase activity slowly   Complete by: As directed      Allergies as of 01/05/2020   No Known Allergies     Medication List    STOP taking these medications   lisinopril-hydrochlorothiazide 20-12.5 MG tablet Commonly known as: ZESTORETIC   potassium chloride SA 20 MEQ tablet Commonly known as: KLOR-CON     TAKE these medications   benztropine 0.5 MG tablet Commonly known  as: COGENTIN Take 0.5 mg by mouth 2 (two) times daily.   busPIRone 10 MG tablet Commonly known as: BUSPAR Take 10 mg by mouth 3 (three) times daily.   haloperidol 10 MG tablet Commonly known as: HALDOL Take 10 mg by mouth 2 (two)  times daily.   lamoTRIgine 100 MG tablet Commonly known as: LAMICTAL Take 100 mg by mouth 2 (two) times daily.   lisinopril 10 MG tablet Commonly known as: ZESTRIL Take 1 tablet (10 mg total) by mouth daily.   metoprolol tartrate 100 MG tablet Commonly known as: LOPRESSOR TAKE 1 TABLET BY MOUTH TWICE DAILY   multivitamins with iron Tabs tablet Take 1 tablet by mouth daily.   potassium phosphate (monobasic) 500 MG tablet Commonly known as: K-PHOS ORIGINAL Take 1 tablet (500 mg total) by mouth 3 (three) times daily with meals for 7 days.       No Known Allergies  Consultations:  None.   Procedures/Studies: CT HEAD WO CONTRAST  Result Date: 01/02/2020 CLINICAL DATA:  Encephalopathy EXAM: CT HEAD WITHOUT CONTRAST TECHNIQUE: Contiguous axial images were obtained from the base of the skull through the vertex without intravenous contrast. COMPARISON:  None. FINDINGS: Brain: No acute intracranial abnormality. Specifically, no hemorrhage, hydrocephalus, mass lesion, acute infarction, or significant intracranial injury. Vascular: No hyperdense vessel or unexpected calcification. Skull: No acute calvarial abnormality. Sinuses/Orbits: Visualized paranasal sinuses and mastoids clear. Orbital soft tissues unremarkable. Other: None IMPRESSION: Normal study. Electronically Signed   By: Charlett Nose M.D.   On: 01/02/2020 22:30   DG Chest Port 1 View  Result Date: 01/02/2020 CLINICAL DATA:  Dizziness, fatigue EXAM: PORTABLE CHEST 1 VIEW COMPARISON:  01/30/2015 FINDINGS: The heart size and mediastinal contours are within normal limits. Both lungs are clear. The visualized skeletal structures are unremarkable. IMPRESSION: No active disease. Electronically Signed   By: Duanne Guess D.O.   On: 01/02/2020 18:00    Subjective: Patient seen and examined.  No overnight events.  No more diarrhea.  She is eager to go home, she states that her sister will pick her up and she feels safe at  home.   Discharge Exam: Vitals:   01/04/20 2127 01/05/20 0524  BP: (!) 142/83 128/90  Pulse: 88 96  Resp:  18  Temp:  97.9 F (36.6 C)  SpO2:  100%   Vitals:   01/04/20 1430 01/04/20 2016 01/04/20 2127 01/05/20 0524  BP: 133/72 (!) 146/90 (!) 142/83 128/90  Pulse: 93 94 88 96  Resp: 18 16  18   Temp: 98.5 F (36.9 C) 98.3 F (36.8 C)  97.9 F (36.6 C)  TempSrc: Oral Oral  Oral  SpO2: 100% 99%  100%  Weight:      Height:        General: Pt is alert, awake, not in acute distress Cardiovascular: RRR, S1/S2 +, no rubs, no gallops Respiratory: CTA bilaterally, no wheezing, no rhonchi Abdominal: Soft, NT, ND, bowel sounds + Extremities: no edema, no cyanosis Patient is somehow slow to respond, however she is alert oriented x3.    The results of significant diagnostics from this hospitalization (including imaging, microbiology, ancillary and laboratory) are listed below for reference.     Microbiology: Recent Results (from the past 240 hour(s))  Respiratory Panel by RT PCR (Flu A&B, Covid) - Nasopharyngeal Swab     Status: None   Collection Time: 01/02/20  9:26 PM   Specimen: Nasopharyngeal Swab  Result Value Ref Range Status   SARS Coronavirus 2 by  RT PCR NEGATIVE NEGATIVE Final    Comment: (NOTE) SARS-CoV-2 target nucleic acids are NOT DETECTED. The SARS-CoV-2 RNA is generally detectable in upper respiratoy specimens during the acute phase of infection. The lowest concentration of SARS-CoV-2 viral copies this assay can detect is 131 copies/mL. A negative result does not preclude SARS-Cov-2 infection and should not be used as the sole basis for treatment or other patient management decisions. A negative result may occur with  improper specimen collection/handling, submission of specimen other than nasopharyngeal swab, presence of viral mutation(s) within the areas targeted by this assay, and inadequate number of viral copies (<131 copies/mL). A negative result must  be combined with clinical observations, patient history, and epidemiological information. The expected result is Negative. Fact Sheet for Patients:  https://www.moore.com/ Fact Sheet for Healthcare Providers:  https://www.young.biz/ This test is not yet ap proved or cleared by the [place] FDA and  has been authorized for detection and/or diagnosis of SARS-CoV-2 by FDA under an Emergency Use Authorization (EUA). This EUA will remain  in effect (meaning this test can be used) for the duration of the COVID-19 declaration under Section 564(b)(1) of the Act, 21 U.S.C. section 360bbb-3(b)(1), unless the authorization is terminated or revoked sooner.    Influenza A by PCR NEGATIVE NEGATIVE Final   Influenza B by PCR NEGATIVE NEGATIVE Final    Comment: (NOTE) The Xpert Xpress SARS-CoV-2/FLU/RSV assay is intended as an aid in  the diagnosis of influenza from Nasopharyngeal swab specimens and  should not be used as a sole basis for treatment. Nasal washings and  aspirates are unacceptable for Xpert Xpress SARS-CoV-2/FLU/RSV  testing. Fact Sheet for Patients: https://www.moore.com/ Fact Sheet for Healthcare Providers: https://www.young.biz/ This test is not yet approved or cleared by the [place] FDA and  has been authorized for detection and/or diagnosis of SARS-CoV-2 by  FDA under an Emergency Use Authorization (EUA). This EUA will remain  in effect (meaning this test can be used) for the duration of the  Covid-19 declaration under Section 564(b)(1) of the Act, 21  U.S.C. section 360bbb-3(b)(1), unless the authorization is  terminated or revoked. Performed at [facility], [address], [place], [place] [address]   MRSA PCR Screening     Status: None   Collection Time: 01/03/20  1:16 AM   Specimen: Nasal Mucosa; Nasopharyngeal  Result Value Ref Range Status   MRSA by PCR  NEGATIVE NEGATIVE Final    Comment:        The GeneXpert MRSA Assay (FDA approved for NASAL specimens only), is one component of a comprehensive MRSA colonization surveillance program. It is not intended to diagnose MRSA infection nor to guide or monitor treatment for MRSA infections. Performed at [facility], [address], [place], [place] [address]   C difficile quick scan w PCR reflex     Status: None   Collection Time: 01/03/20  6:48 AM   Specimen: STOOL  Result Value Ref Range Status   C Diff antigen NEGATIVE NEGATIVE Final   C Diff toxin NEGATIVE NEGATIVE Final   C Diff interpretation No C. difficile detected.  Final    Comment: Performed at [facility], [address] [place], [place] [address]     Labs: BNP (last 3 results) No results for input(s): BNP in the last 8760 hours. Basic Metabolic Panel: Recent Labs  Lab 01/02/20 1756 01/02/20 1756 01/03/20 0126 01/03/20 0126 01/03/20 6237 01/03/20 1518 01/03/20 2135 01/04/20 6283 01/05/20 1517  NA 117*   < > 123*   < > 128* 133* 128* 131* 131*  K <2.0*   < > <2.0*   < > 2.3* 2.9* 3.5 3.6 4.4  CL <65*   < > 75*   < > 82* 87* 89* 91* 95*  CO2 38*   < > 33*   < > 34* 34* 30 31 28   GLUCOSE 102*   < > 87   < > 118* 99 92 85 101*  BUN 8   < > 7   < > 6 8 11 10 15   CREATININE 0.91   < > 0.74   < > 0.69 0.78 0.72 0.66 0.80  CALCIUM 8.3*   < > 8.3*   < > 8.1* 8.6* 8.1* 8.4* 8.8*  MG 2.1  --  2.2  --   --   --   --   --  1.9  PHOS 2.1*  --  1.7*  --   --   --   --   --  1.8*   < > = values in this interval not displayed.   Liver Function Tests: Recent Labs  Lab 01/02/20 1533 01/02/20 1756 01/03/20 0126 01/04/20 0639 01/05/20 0454  AST 95* 86* 71* 34 24  ALT 49* 46* 46* 36 34  ALKPHOS 106 100 91 82 74  BILITOT 1.1 1.0 0.7 0.4 0.7  PROT 6.5 6.3* 6.0* 5.5* 5.7*  ALBUMIN 3.4* 3.4* 3.2* 3.0* 3.2*   Recent Labs  Lab 01/02/20 1533  LIPASE 23   No results for input(s):  AMMONIA in the last 168 hours. CBC: Recent Labs  Lab 01/02/20 1533 01/02/20 1756 01/03/20 0126 01/04/20 0639 01/05/20 0454  WBC 12.9* 12.2* 10.2 9.9 10.1  NEUTROABS  --   --   --   --  4.9  HGB 14.2 13.2 12.9 11.5* 11.2*  HCT 42.6 39.4 38.9 36.0 35.2*  MCV 70.0* 69.7* 70.9* 72.6* 72.3*  PLT 297 270 262 281 303   Cardiac Enzymes: Recent Labs  Lab 01/02/20 1755 01/05/20 0454  CKTOTAL 2,329* 335*   BNP: Invalid input(s): POCBNP CBG: No results for input(s): GLUCAP in the last 168 hours. D-Dimer Recent Labs    01/02/20 1755  DDIMER 2.42*   Hgb A1c No results for input(s): HGBA1C in the last 72 hours. Lipid Profile No results for input(s): CHOL, HDL, LDLCALC, TRIG, CHOLHDL, LDLDIRECT in the last 72 hours. Thyroid function studies Recent Labs    01/03/20 0126  TSH 0.631   Anemia work up Recent Labs    01/02/20 2126  FERRITIN 263   Urinalysis    Component Value Date/Time   COLORURINE YELLOW 01/02/2020 1756   APPEARANCEUR HAZY (A) 01/02/2020 1756   LABSPEC 1.002 (L) 01/02/2020 1756   PHURINE 7.0 01/02/2020 1756   GLUCOSEU NEGATIVE 01/02/2020 1756   HGBUR NEGATIVE 01/02/2020 1756   BILIRUBINUR NEGATIVE 01/02/2020 1756   KETONESUR 5 (A) 01/02/2020 1756   PROTEINUR NEGATIVE 01/02/2020 1756   UROBILINOGEN 0.2 08/05/2012 2248   NITRITE NEGATIVE 01/02/2020 1756   LEUKOCYTESUR NEGATIVE 01/02/2020 1756   Sepsis Labs Invalid input(s): PROCALCITONIN,  WBC,  LACTICIDVEN Microbiology Recent Results (from the past 240 hour(s))  Respiratory Panel by RT PCR (Flu A&B, Covid) - Nasopharyngeal Swab     Status: None   Collection Time: 01/02/20  9:26 PM   Specimen: Nasopharyngeal Swab  Result Value Ref Range Status   SARS Coronavirus 2 by RT PCR NEGATIVE NEGATIVE Final    Comment: (  NOTE) SARS-CoV-2 target nucleic acids are NOT DETECTED. The SARS-CoV-2 RNA is generally detectable in upper respiratoy specimens during the acute phase of infection. The  lowest concentration of SARS-CoV-2 viral copies this assay can detect is 131 copies/mL. A negative result does not preclude SARS-Cov-2 infection and should not be used as the sole basis for treatment or other patient management decisions. A negative result may occur with  improper specimen collection/handling, submission of specimen other than nasopharyngeal swab, presence of viral mutation(s) within the areas targeted by this assay, and inadequate number of viral copies (<131 copies/mL). A negative result must be combined with clinical observations, patient history, and epidemiological information. The expected result is Negative. Fact Sheet for Patients:  PinkCheek.be Fact Sheet for Healthcare Providers:  GravelBags.it This test is not yet ap proved or cleared by the [place] FDA and  has been authorized for detection and/or diagnosis of SARS-CoV-2 by FDA under an Emergency Use Authorization (EUA). This EUA will remain  in effect (meaning this test can be used) for the duration of the COVID-19 declaration under Section 564(b)(1) of the Act, 21 U.S.C. section 360bbb-3(b)(1), unless the authorization is terminated or revoked sooner.    Influenza A by PCR NEGATIVE NEGATIVE Final   Influenza B by PCR NEGATIVE NEGATIVE Final    Comment: (NOTE) The Xpert Xpress SARS-CoV-2/FLU/RSV assay is intended as an aid in  the diagnosis of influenza from Nasopharyngeal swab specimens and  should not be used as a sole basis for treatment. Nasal washings and  aspirates are unacceptable for Xpert Xpress SARS-CoV-2/FLU/RSV  testing. Fact Sheet for Patients: PinkCheek.be Fact Sheet for Healthcare Providers: GravelBags.it This test is not yet approved or cleared by the [place] FDA and  has been authorized for detection and/or diagnosis of SARS-CoV-2 by  FDA under an Emergency  Use Authorization (EUA). This EUA will remain  in effect (meaning this test can be used) for the duration of the  Covid-19 declaration under Section 564(b)(1) of the Act, 21  U.S.C. section 360bbb-3(b)(1), unless the authorization is  terminated or revoked. Performed at [facility], [place] [address], [place], [place] [address]   MRSA PCR Screening     Status: None   Collection Time: 01/03/20  1:16 AM   Specimen: Nasal Mucosa; Nasopharyngeal  Result Value Ref Range Status   MRSA by PCR NEGATIVE NEGATIVE Final    Comment:        The GeneXpert MRSA Assay (FDA approved for NASAL specimens only), is one component of a comprehensive MRSA colonization surveillance program. It is not intended to diagnose MRSA infection nor to guide or monitor treatment for MRSA infections. Performed at [facility], [place] [address] [place], [place] [address]   C difficile quick scan w PCR reflex     Status: None   Collection Time: 01/03/20  6:48 AM   Specimen: STOOL  Result Value Ref Range Status   C Diff antigen NEGATIVE NEGATIVE Final   C Diff toxin NEGATIVE NEGATIVE Final   C Diff interpretation No C. difficile detected.  Final    Comment: Performed at [facility], [place] [address] [place], [place] [address]     Time coordinating discharge:  35 minutes  SIGNED:   Barb Merino, MD  [facility] 01/05/2020, 1:34 PM

## 2020-01-05 NOTE — Discharge Instructions (Signed)
Hypokalemia Hypokalemia means that the amount of potassium in the blood is lower than normal. Potassium is a chemical (electrolyte) that helps regulate the amount of fluid in the body. It also stimulates muscle tightening (contraction) and helps nerves work properly. Normally, most of the body's potassium is inside cells, and only a very small amount is in the blood. Because the amount in the blood is so small, minor changes to potassium levels in the blood can be life-threatening. What are the causes? This condition may be caused by:  Antibiotic medicine.  Diarrhea or vomiting. Taking too much of a medicine that helps you have a bowel movement (laxative) can cause diarrhea and lead to hypokalemia.  Chronic kidney disease (CKD).  Medicines that help the body get rid of excess fluid (diuretics).  Eating disorders, such as bulimia.  Low magnesium levels in the body.  Sweating a lot. What are the signs or symptoms? Symptoms of this condition include:  Weakness.  Constipation.  Fatigue.  Muscle cramps.  Mental confusion.  Skipped heartbeats or irregular heartbeat (palpitations).  Tingling or numbness. How is this diagnosed? This condition is diagnosed with a blood test. How is this treated? This condition may be treated by:  Taking potassium supplements by mouth.  Adjusting the medicines that you take.  Eating more foods that contain a lot of potassium. If your potassium level is very low, you may need to get potassium through an IV and be monitored in the hospital. Follow these instructions at home:   Take over-the-counter and prescription medicines only as told by your health care provider. This includes vitamins and supplements.  Eat a healthy diet. A healthy diet includes fresh fruits and vegetables, whole grains, healthy fats, and lean proteins.  If instructed, eat more foods that contain a lot of potassium. This includes: ? Nuts, such as peanuts and  pistachios. ? Seeds, such as sunflower seeds and pumpkin seeds. ? Peas, lentils, and lima beans. ? Whole grain and bran cereals and breads. ? Fresh fruits and vegetables, such as apricots, avocado, bananas, cantaloupe, kiwi, oranges, tomatoes, asparagus, and potatoes. ? Orange juice. ? Tomato juice. ? Red meats. ? Yogurt.  Keep all follow-up visits as told by your health care provider. This is important. Contact a health care provider if you:  Have weakness that gets worse.  Feel your heart pounding or racing.  Vomit.  Have diarrhea.  Have diabetes (diabetes mellitus) and you have trouble keeping your blood sugar (glucose) in your target range. Get help right away if you:  Have chest pain.  Have shortness of breath.  Have vomiting or diarrhea that lasts for more than 2 days.  Faint. Summary  Hypokalemia means that the amount of potassium in the blood is lower than normal.  This condition is diagnosed with a blood test.  Hypokalemia may be treated by taking potassium supplements, adjusting the medicines that you take, or eating more foods that are high in potassium.  If your potassium level is very low, you may need to get potassium through an IV and be monitored in the hospital. This information is not intended to replace advice given to you by your health care provider. Make sure you discuss any questions you have with your health care provider. Document Revised: 07/21/2018 Document Reviewed: 07/21/2018 Elsevier Patient Education  [place].   Hyponatremia Hyponatremia is when the amount of salt (sodium) in your blood is too low. When salt levels are low, your body may take in extra  water. This can cause swelling throughout the body. The swelling often affects the brain. What are the causes? This condition may be caused by:  Certain medical problems or conditions.  Vomiting a lot.  Having watery poop (diarrhea) often.  Certain medicines or illegal  drugs.  Not having enough water in the body (dehydration).  Drinking too much water.  Eating a diet that is low in salt.  Large burns on your body.  Too much sweating. What increases the risk? You are more likely to get this condition if you:  Have long-term (chronic) kidney disease.  Have heart failure.  Have a medical condition that causes you to have watery poop often.  Do very hard exercises.  Take medicines that affect the amount of salt is in your blood. What are the signs or symptoms? Symptoms of this condition include:  Headache.  Feeling like you may vomit (nausea).  Vomiting.  Being very tired (lethargic).  Muscle weakness and cramps.  Not wanting to eat as much as normal (loss of appetite).  Feeling weak or light-headed. Severe symptoms of this condition include:  Confusion.  Feeling restless (agitation).  Having a fast heart rate.  Passing out (fainting).  Seizures.  Coma. How is this treated? Treatment for this condition depends on the cause. Treatment may include:  Getting fluids through an IV tube that is put into one of your veins.  Taking medicines to fix the salt levels in your blood. If medicines are causing the problem, your medicines will need to be changed.  Limiting how much water or fluid you take in.  Monitoring in the hospital to watch your symptoms. Follow these instructions at home:   Take over-the-counter and prescription medicines only as told by your doctor. Many medicines can make this condition worse. Talk with your doctor about any medicines that you are taking.  Eat and drink exactly as you are told by your doctor. ? Eat only the foods you are told to eat. ? Limit how much fluid you take.  Do not drink alcohol.  Keep all follow-up visits as told by your doctor. This is important. Contact a doctor if:  You feel more like you may vomit.  You feel more tired.  Your headache gets worse.  You feel more  confused.  You feel weaker.  Your symptoms go away and then they come back.  You have trouble following the diet instructions. Get help right away if:  You have a seizure.  You pass out.  You keep having watery poop.  You keep vomiting. Summary  Hyponatremia is when the amount of salt in your blood is too low.  When salt levels are low, you can have swelling throughout the body. The swelling mostly affects the brain.  Treatment depends on the cause. Treatment may include getting IV fluids, medicines, or not drinking as much fluid. This information is not intended to replace advice given to you by your health care provider. Make sure you discuss any questions you have with your health care provider. Document Revised: 02/24/2019 Document Reviewed: 11/11/2018 Elsevier Patient Education  2020 [employer].

## 2020-01-05 NOTE — Progress Notes (Signed)
Patient being discharged at this time.  Discharge instructions given with stated understanding from patient and family.  Patient transported to front entrance of building via wheelchair.

## 2020-01-05 NOTE — Progress Notes (Signed)
Physical Therapy Treatment Patient Details Name: Jackie Odonnell MRN: [id number] DOB: 1961-02-25 Today's Date: 01/05/2020    History of Present Illness This 59 year old female was admitted with multiple complaints including nausea, vomiting, diarrhea and bone pain.  She has a PMH of schizoaffective disorder, HTN, and tobacco abuse    PT Comments    Pt demonstrating significant improvement today.  She was able to ambulate and perform stairs with supervision.  All vitals stable with activity.  Pt had no episodes of "feeling like she would fall out," but did still have some reports of "Heavy Headiness."  Performed DGI that indicated pt low fall risk.  No further acute PT indicated.     Follow Up Recommendations  No PT follow up;Supervision - Intermittent     Equipment Recommendations  None recommended by PT    Recommendations for Other Services       Precautions / Restrictions Precautions Precautions: Fall    Mobility  Bed Mobility Overal bed mobility: Independent                Transfers Overall transfer level: Needs assistance Equipment used: None Transfers: Sit to/from Stand Sit to Stand: Supervision            Ambulation/Gait Ambulation/Gait assistance: Supervision Gait Distance (Feet): 300 Feet Assistive device: None Gait Pattern/deviations: WFL(Within Functional Limits)     General Gait Details: Demonstrated safe gait at normal speed without LOB;  Able to tolerate dynamic gait (See DGI) challenges without LOB   Stairs Stairs: Yes Stairs assistance: Min guard;Supervision Stair Management: One rail Right;Alternating pattern Number of Stairs: 11 General stair comments: steady, no LOB   Wheelchair Mobility    Modified Rankin (Stroke Patients Only)       Balance   Sitting-balance support: No upper extremity supported;Feet supported Sitting balance-Leahy Scale: Normal     Standing balance support: No upper extremity supported;During  functional activity Standing balance-Leahy Scale: Good                 High Level Balance Comments: Performed DGI without LOB;  attempted tandem gait and needed HHA to maintain balance Standardized Balance Assessment Standardized Balance Assessment : Dynamic Gait Index   Dynamic Gait Index Level Surface: Normal Change in Gait Speed: Normal Gait with Horizontal Head Turns: Mild Impairment Gait with Vertical Head Turns: Normal Gait and Pivot Turn: Normal Step Over Obstacle: Mild Impairment Step Around Obstacles: Normal Steps: Mild Impairment Total Score: 21      Cognition Arousal/Alertness: Awake/alert Behavior During Therapy: WFL for tasks assessed/performed Overall Cognitive Status: Within Functional Limits for tasks assessed                                 General Comments: followed all commands; good safety awareness      Exercises      General Comments General comments (skin integrity, edema, etc.): Some reports of "heavy headiness" but inconsistent.  All VSS. Orthostatic BPs  Supine 129/83  Sitting 127/85  Standing 137/85  Standing post walk 141/88  HR consistently in 80's and O2 sats 100%          Pertinent Vitals/Pain Pain Assessment: No/denies pain    Home Living                      Prior Function            PT Goals (current goals  can now be found in the care plan section) Progress towards PT goals: Goals met/education completed, patient discharged from PT    Frequency           PT Plan Other (comment);Discharge plan needs to be updated(no further PT indicated)    Co-evaluation              AM-PAC PT "6 Clicks" Mobility   Outcome Measure  Help needed turning from your back to your side while in a flat bed without using bedrails?: None Help needed moving from lying on your back to sitting on the side of a flat bed without using bedrails?: None Help needed moving to and from a bed to a chair (including  a wheelchair)?: None Help needed standing up from a chair using your arms (e.g., wheelchair or bedside chair)?: None Help needed to walk in hospital room?: None Help needed climbing 3-5 steps with a railing? : None 6 Click Score: 24    End of Session Equipment Utilized During Treatment: Gait belt Activity Tolerance: Patient tolerated treatment well Patient left: in bed;with call bell/phone within reach;with bed alarm set Nurse Communication: Mobility status PT Visit Diagnosis: Difficulty in walking, not elsewhere classified (R26.2);Unsteadiness on feet (R26.81)     Time: 1130-1157 PT Time Calculation (min) (ACUTE ONLY): 27 min  Charges:  $Gait Training: 23-37 mins                     Maggie Font, PT Acute Rehab Services Pager [phone] [facility] [phone]    Karlton Lemon 01/05/2020, 1:40 PM

## 2020-01-18 DIAGNOSIS — F17200 Nicotine dependence, unspecified, uncomplicated: Secondary | ICD-10-CM | POA: Diagnosis not present

## 2020-01-18 DIAGNOSIS — F250 Schizoaffective disorder, bipolar type: Secondary | ICD-10-CM | POA: Diagnosis not present

## 2020-01-25 ENCOUNTER — Other Ambulatory Visit: Payer: Self-pay | Admitting: Family

## 2020-01-25 DIAGNOSIS — I10 Essential (primary) hypertension: Secondary | ICD-10-CM

## 2020-02-10 ENCOUNTER — Ambulatory Visit: Payer: Medicare Other | Admitting: Family

## 2020-02-17 ENCOUNTER — Other Ambulatory Visit: Payer: Self-pay

## 2020-02-17 ENCOUNTER — Encounter: Payer: Self-pay | Admitting: Family

## 2020-02-17 ENCOUNTER — Ambulatory Visit (INDEPENDENT_AMBULATORY_CARE_PROVIDER_SITE_OTHER): Payer: Medicare Other | Admitting: Family

## 2020-02-17 VITALS — BP 140/88 | HR 88 | Temp 98.0°F | Ht 66.5 in | Wt 186.2 lb

## 2020-02-17 DIAGNOSIS — I10 Essential (primary) hypertension: Secondary | ICD-10-CM | POA: Diagnosis not present

## 2020-02-17 DIAGNOSIS — R899 Unspecified abnormal finding in specimens from other organs, systems and tissues: Secondary | ICD-10-CM

## 2020-02-17 DIAGNOSIS — E871 Hypo-osmolality and hyponatremia: Secondary | ICD-10-CM | POA: Diagnosis not present

## 2020-02-17 LAB — CBC WITH DIFFERENTIAL/PLATELET
Basophils Absolute: 0.1 10*3/uL (ref 0.0–0.1)
Basophils Relative: 1.2 % (ref 0.0–3.0)
Eosinophils Absolute: 0 10*3/uL (ref 0.0–0.7)
Eosinophils Relative: 0.5 % (ref 0.0–5.0)
HCT: 39.9 % (ref 36.0–46.0)
Hemoglobin: 12.5 g/dL (ref 12.0–15.0)
Lymphocytes Relative: 31.1 % (ref 12.0–46.0)
Lymphs Abs: 2.3 10*3/uL (ref 0.7–4.0)
MCHC: 31.3 g/dL (ref 30.0–36.0)
MCV: 72.9 fl — ABNORMAL LOW (ref 78.0–100.0)
Monocytes Absolute: 0.6 10*3/uL (ref 0.1–1.0)
Monocytes Relative: 7.5 % (ref 3.0–12.0)
Neutro Abs: 4.4 10*3/uL (ref 1.4–7.7)
Neutrophils Relative %: 59.7 % (ref 43.0–77.0)
Platelets: 254 10*3/uL (ref 150.0–400.0)
RBC: 5.48 Mil/uL — ABNORMAL HIGH (ref 3.87–5.11)
RDW: 17.2 % — ABNORMAL HIGH (ref 11.5–15.5)
WBC: 7.4 10*3/uL (ref 4.0–10.5)

## 2020-02-17 LAB — COMPREHENSIVE METABOLIC PANEL
ALT: 15 U/L (ref 0–35)
AST: 11 U/L (ref 0–37)
Albumin: 3.9 g/dL (ref 3.5–5.2)
Alkaline Phosphatase: 87 U/L (ref 39–117)
BUN: 8 mg/dL (ref 6–23)
CO2: 29 mEq/L (ref 19–32)
Calcium: 9.7 mg/dL (ref 8.4–10.5)
Chloride: 105 mEq/L (ref 96–112)
Creatinine, Ser: 0.8 mg/dL (ref 0.40–1.20)
GFR: 88.99 mL/min (ref >60.00)
Glucose, Bld: 100 mg/dL — ABNORMAL HIGH (ref 70–99)
Potassium: 3.6 mEq/L (ref 3.5–5.1)
Sodium: 139 mEq/L (ref 135–145)
Total Bilirubin: 0.7 mg/dL (ref 0.2–1.2)
Total Protein: 6.4 g/dL (ref 6.0–8.3)

## 2020-02-17 LAB — PHOSPHORUS: Phosphorus: 3.3 mg/dL (ref 2.3–4.6)

## 2020-02-17 LAB — MAGNESIUM: Magnesium: 1.8 mg/dL (ref 1.5–2.5)

## 2020-02-17 MED ORDER — METOPROLOL TARTRATE 100 MG PO TABS: 100.0000 mg | ORAL_TABLET | Freq: Two times a day (BID) | ORAL | 3 refills | Status: DC

## 2020-02-17 MED ORDER — LISINOPRIL 20 MG PO TABS: 20.0000 mg | ORAL_TABLET | Freq: Every day | ORAL | 3 refills | Status: DC

## 2020-02-17 NOTE — Patient Instructions (Signed)
Please stop taking the extra potassium supplements;

## 2020-02-17 NOTE — Progress Notes (Signed)
Jackie Odonnell is a 58 y.o. female with the following history as recorded in EpicCare:  Patient Active Problem List   Diagnosis Date Noted  . Prolonged QT interval 01/03/2020  . Microcytosis 04/01/2016  . Chronic cough 11/07/2014  . Schizophrenia, paranoid (HCC) 08/09/2012  . Tobacco abuse 08/06/2012  . Hyponatremia 08/05/2012  . Hypokalemia 08/05/2012  . Suicidal ideation 08/05/2012  . Essential hypertension 04/25/2009  . Asthma 10/18/2007  . PNEUMONIA, HX OF 10/18/2007    Current Outpatient Medications  Medication Sig Dispense Refill  . ARIPiprazole (ABILIFY) 10 MG tablet     . benztropine (COGENTIN) 0.5 MG tablet Take 0.5 mg by mouth 2 (two) times daily.    . busPIRone (BUSPAR) 10 MG tablet Take 10 mg by mouth 3 (three) times daily.   2  . lamoTRIgine (LAMICTAL) 100 MG tablet Take 100 mg by mouth 2 (two) times daily.    . lisinopril (ZESTRIL) 20 MG tablet Take 1 tablet (20 mg total) by mouth daily. 90 tablet 3  . metoprolol tartrate (LOPRESSOR) 100 MG tablet Take 1 tablet (100 mg total) by mouth 2 (two) times daily. 180 tablet 3  . Multiple Vitamins-Iron (MULTIVITAMINS WITH IRON) TABS tablet Take 1 tablet by mouth daily.     No current facility-administered medications for this visit.    Allergies: Patient has no known allergies.  Past Medical History:  Diagnosis Date  . Anxiety   . Bipolar affective (HCC)    Chemical Imbalance  . Depression   . Hypertension     Past Surgical History:  Procedure Laterality Date  . ABDOMINAL HYSTERECTOMY    . ECTOPIC PREGNANCY SURGERY    . intestines removed     scar tissue issue    Family History  Problem Relation Age of Onset  . Colon cancer Neg Hx   . Esophageal cancer Neg Hx   . Rectal cancer Neg Hx   . Stomach cancer Neg Hx     Social History   Tobacco Use  . Smoking status: Current Every Day Smoker    Packs/day: 2.00    Years: 15.00    Pack years: 30.00    Types: Cigarettes  . Smokeless tobacco: Never Used  .  Tobacco comment: Trying to cut back  Substance Use Topics  . Alcohol use: No    Alcohol/week: 0.0 standard drinks    Subjective:  Presents for a follow up on hypertension; hospitalized in January 2021 with dehydration/ hypokalemia; she was changed to Lisinopril only- HCTZ was stopped; patient did not understand to stop her potassium supplements and has continued this. She is still taking Metoprolol bid;   [facility] is managing pap smear/ mammogram; due for follow-up there in April;   Could not complete colonoscopy last year due to transportation issues; does not want to re-schedule at this time;    Objective:  Vitals:   02/17/20 1304  BP: 140/88  Pulse: 88  Temp: 98 F (36.7 C)  TempSrc: Oral  SpO2: 99%  Weight: 186 lb 3.2 oz (84.5 kg)  Height: 5' 6.5" (1.689 m)    General: Well developed, well nourished, in no acute distress  Skin : Warm and dry.  Head: Normocephalic and atraumatic  Lungs: Respirations unlabored; clear to auscultation bilaterally without wheeze, rales, rhonchi  CVS exam: normal rate and regular rhythm.  Vessels: Symmetric bilaterally  Neurologic: Alert and oriented; speech intact; face symmetrical; moves all extremities well; CNII-XII intact without focal deficit   Assessment:  1. Hyponatremia     2. Hypomagnesemia   3. Abnormal laboratory test result   4. Essential hypertension     Plan:  Update labs today; stressed to patient that she does not need to take potassium supplements at this time; Increase Lisinopril to 20 mg daily; continue Metoprolol; follow-up here in 6 months; She will plan to schedule with her GYN; she defers scheduling colonoscopy at this time.  This visit occurred during the SARS-CoV-2 public health emergency.  Safety protocols were in place, including screening questions prior to the visit, additional usage of staff PPE, and extensive cleaning of exam room while observing appropriate contact time as indicated for disinfecting  solutions.     No follow-ups on file.  Orders Placed This Encounter  Procedures  . CBC w/Diff  . Comp Met (CMET)  . Magnesium  . Phosphorus    Requested Prescriptions   Signed Prescriptions Disp Refills  . lisinopril (ZESTRIL) 20 MG tablet 90 tablet 3    Sig: Take 1 tablet (20 mg total) by mouth daily.  . metoprolol tartrate (LOPRESSOR) 100 MG tablet 180 tablet 3    Sig: Take 1 tablet (100 mg total) by mouth 2 (two) times daily.     

## 2020-03-18 ENCOUNTER — Other Ambulatory Visit: Payer: Self-pay | Admitting: Family

## 2020-03-18 DIAGNOSIS — E876 Hypokalemia: Secondary | ICD-10-CM

## 2020-03-18 DIAGNOSIS — I10 Essential (primary) hypertension: Secondary | ICD-10-CM

## 2020-04-02 ENCOUNTER — Other Ambulatory Visit: Payer: Self-pay | Admitting: Family

## 2020-04-02 ENCOUNTER — Telehealth: Payer: Self-pay

## 2020-04-02 DIAGNOSIS — I10 Essential (primary) hypertension: Secondary | ICD-10-CM

## 2020-04-02 NOTE — Telephone Encounter (Signed)
Spoke with patient. We will have her come back and get her potassium checked and follow up with [place] in 6 months.

## 2020-04-02 NOTE — Telephone Encounter (Signed)
New message   The patient voiced wanted to discuss Potassium medication and why it was discontinued.

## 2020-04-28 DIAGNOSIS — Z23 Encounter for immunization: Secondary | ICD-10-CM | POA: Diagnosis not present

## 2020-05-04 ENCOUNTER — Other Ambulatory Visit: Payer: Self-pay | Admitting: Family

## 2020-05-04 DIAGNOSIS — I10 Essential (primary) hypertension: Secondary | ICD-10-CM

## 2020-05-19 DIAGNOSIS — Z23 Encounter for immunization: Secondary | ICD-10-CM | POA: Diagnosis not present

## 2020-06-04 DIAGNOSIS — F419 Anxiety disorder, unspecified: Secondary | ICD-10-CM | POA: Diagnosis not present

## 2020-06-04 DIAGNOSIS — F17200 Nicotine dependence, unspecified, uncomplicated: Secondary | ICD-10-CM | POA: Diagnosis not present

## 2020-06-04 DIAGNOSIS — F250 Schizoaffective disorder, bipolar type: Secondary | ICD-10-CM | POA: Diagnosis not present

## 2020-08-08 ENCOUNTER — Ambulatory Visit: Payer: Medicare Other | Admitting: Family

## 2020-08-13 ENCOUNTER — Ambulatory Visit: Payer: Medicare Other | Admitting: Family

## 2020-08-14 ENCOUNTER — Telehealth: Payer: Self-pay | Admitting: Family

## 2020-08-14 NOTE — Telephone Encounter (Signed)
New Message:   Pt states she would like a call back from the assistant but did not give details as of to what it was about. Please advise.

## 2020-08-15 NOTE — Telephone Encounter (Signed)
Called pt no answer LMOM RTC.../lmb 

## 2020-08-23 DIAGNOSIS — F17200 Nicotine dependence, unspecified, uncomplicated: Secondary | ICD-10-CM | POA: Diagnosis not present

## 2020-08-23 DIAGNOSIS — F419 Anxiety disorder, unspecified: Secondary | ICD-10-CM | POA: Diagnosis not present

## 2020-08-23 DIAGNOSIS — F250 Schizoaffective disorder, bipolar type: Secondary | ICD-10-CM | POA: Diagnosis not present

## 2020-08-31 ENCOUNTER — Other Ambulatory Visit: Payer: Self-pay

## 2020-08-31 ENCOUNTER — Ambulatory Visit (INDEPENDENT_AMBULATORY_CARE_PROVIDER_SITE_OTHER): Payer: Medicare Other | Admitting: Family

## 2020-08-31 VITALS — BP 140/95 | HR 88 | Temp 98.3°F | Ht 66.5 in | Wt 183.0 lb

## 2020-08-31 DIAGNOSIS — I10 Essential (primary) hypertension: Secondary | ICD-10-CM | POA: Diagnosis not present

## 2020-08-31 MED ORDER — LISINOPRIL 20 MG PO TABS: 20.0000 mg | ORAL_TABLET | Freq: Two times a day (BID) | ORAL | 3 refills | Status: DC

## 2020-08-31 NOTE — Progress Notes (Signed)
Jackie Odonnell is a 59 y.o. female with the following history as recorded in EpicCare:  Patient Active Problem List   Diagnosis Date Noted  . Prolonged QT interval 01/03/2020  . Microcytosis 04/01/2016  . Chronic cough 11/07/2014  . Schizophrenia, paranoid (HCC) 08/09/2012  . Tobacco abuse 08/06/2012  . Hyponatremia 08/05/2012  . Hypokalemia 08/05/2012  . Suicidal ideation 08/05/2012  . Essential hypertension 04/25/2009  . Asthma 10/18/2007  . PNEUMONIA, HX OF 10/18/2007    Current Outpatient Medications  Medication Sig Dispense Refill  . ARIPiprazole (ABILIFY) 20 MG tablet     . benztropine (COGENTIN) 0.5 MG tablet Take 0.5 mg by mouth 2 (two) times daily.    . busPIRone (BUSPAR) 10 MG tablet Take 10 mg by mouth 3 (three) times daily.   2  . hydrOXYzine (ATARAX/VISTARIL) 25 MG tablet     . lamoTRIgine (LAMICTAL) 100 MG tablet Take 100 mg by mouth 2 (two) times daily.    Marland Kitchen lisinopril (ZESTRIL) 20 MG tablet Take 1 tablet (20 mg total) by mouth in the morning and at bedtime. 180 tablet 3  . metoprolol tartrate (LOPRESSOR) 100 MG tablet TAKE 1 TABLET BY MOUTH TWICE DAILY 180 tablet 3  . Multiple Vitamins-Iron (MULTIVITAMINS WITH IRON) TABS tablet Take 1 tablet by mouth daily.     No current facility-administered medications for this visit.    Allergies: Patient has no known allergies.  Past Medical History:  Diagnosis Date  . Anxiety   . Bipolar affective (HCC)    Chemical Imbalance  . Depression   . Hypertension     Past Surgical History:  Procedure Laterality Date  . ABDOMINAL HYSTERECTOMY    . ECTOPIC PREGNANCY SURGERY    . intestines removed     scar tissue issue    Family History  Problem Relation Age of Onset  . Colon cancer Neg Hx   . Esophageal cancer Neg Hx   . Rectal cancer Neg Hx   . Stomach cancer Neg Hx     Social History   Tobacco Use  . Smoking status: Current Every Day Smoker    Packs/day: 2.00    Years: 15.00    Pack years: 30.00    Types:  Cigarettes  . Smokeless tobacco: Never Used  . Tobacco comment: Trying to cut back  Substance Use Topics  . Alcohol use: No    Alcohol/week: 0.0 standard drinks    Subjective:  6 month follow-up on hypertension; is taking medication as prescribed; Denies any chest pain, shortness of breath, blurred vision or headache Is concerned about her potassium and asks that it be checked again;     Objective:  Vitals:   08/31/20 1553  BP: (!) 140/95  Pulse: 88  Temp: 98.3 F (36.8 C)  TempSrc: Oral  SpO2: 98%  Weight: 183 lb (83 kg)  Height: 5' 6.5" (1.689 m)    General: Well developed, well nourished, in no acute distress   Head: Normocephalic and atraumatic  Lungs: Respirations unlabored; clear to auscultation bilaterally without wheeze, rales, rhonchi  CVS exam: normal rate and regular rhythm.  Abdomen: Soft; nontender; nondistended; normoactive bowel sounds; no masses or hepatosplenomegaly  Musculoskeletal: No deformities; no active joint inflammation  Extremities: No edema, cyanosis, clubbing  Vessels: Symmetric bilaterally  Neurologic: Alert and oriented; speech intact; face symmetrical; moves all extremities well; CNII-XII intact without focal deficit   Assessment:  1. Essential hypertension     Plan:  Uncontrolled; increase Lisinopril to 20 mg bid;  continue Metoprolol bid; check BMP today; follow up in 2 weeks;  This visit occurred during the SARS-CoV-2 public health emergency.  Safety protocols were in place, including screening questions prior to the visit, additional usage of staff PPE, and extensive cleaning of exam room while observing appropriate contact time as indicated for disinfecting solutions.     No follow-ups on file.  Orders Placed This Encounter  Procedures  . Basic Metabolic Panel (BMET)    Standing Status:   Future    Number of Occurrences:   1    Standing Expiration Date:   08/31/2021    Requested Prescriptions   Signed Prescriptions Disp Refills   . lisinopril (ZESTRIL) 20 MG tablet 180 tablet 3    Sig: Take 1 tablet (20 mg total) by mouth in the morning and at bedtime.

## 2020-09-01 LAB — BASIC METABOLIC PANEL
BUN/Creatinine Ratio: 7 (calc) (ref 6–22)
BUN: 6 mg/dL — ABNORMAL LOW (ref 7–25)
CO2: 29 mmol/L (ref 20–32)
Calcium: 9.4 mg/dL (ref 8.6–10.4)
Chloride: 105 mmol/L (ref 98–110)
Creat: 0.84 mg/dL (ref 0.50–1.05)
Glucose, Bld: 103 mg/dL — ABNORMAL HIGH (ref 65–99)
Potassium: 3.3 mmol/L — ABNORMAL LOW (ref 3.5–5.3)
Sodium: 143 mmol/L (ref 135–146)

## 2020-09-03 ENCOUNTER — Other Ambulatory Visit: Payer: Self-pay | Admitting: Family

## 2020-09-03 MED ORDER — POTASSIUM CHLORIDE ER 10 MEQ PO TBCR: 10.0000 meq | EXTENDED_RELEASE_TABLET | Freq: Every day | ORAL | 0 refills | Status: DC

## 2020-10-02 ENCOUNTER — Other Ambulatory Visit: Payer: Self-pay | Admitting: Family

## 2020-10-25 DIAGNOSIS — F250 Schizoaffective disorder, bipolar type: Secondary | ICD-10-CM | POA: Diagnosis not present

## 2020-10-25 DIAGNOSIS — F419 Anxiety disorder, unspecified: Secondary | ICD-10-CM | POA: Diagnosis not present

## 2020-10-25 DIAGNOSIS — F17200 Nicotine dependence, unspecified, uncomplicated: Secondary | ICD-10-CM | POA: Diagnosis not present

## 2020-11-02 ENCOUNTER — Ambulatory Visit: Payer: Medicare Other

## 2020-11-02 ENCOUNTER — Ambulatory Visit: Payer: Medicare Other | Admitting: Family

## 2020-11-19 ENCOUNTER — Ambulatory Visit: Payer: Medicare Other | Admitting: Family

## 2020-11-19 ENCOUNTER — Ambulatory Visit: Payer: Medicare Other

## 2020-11-23 ENCOUNTER — Ambulatory Visit: Payer: Medicare Other | Admitting: Family

## 2020-11-26 ENCOUNTER — Encounter: Payer: Self-pay | Admitting: Family

## 2020-11-26 ENCOUNTER — Other Ambulatory Visit: Payer: Self-pay | Admitting: Family

## 2020-11-26 ENCOUNTER — Ambulatory Visit (INDEPENDENT_AMBULATORY_CARE_PROVIDER_SITE_OTHER): Payer: Medicare Other | Admitting: Family

## 2020-11-26 ENCOUNTER — Other Ambulatory Visit: Payer: Self-pay

## 2020-11-26 VITALS — BP 170/100 | HR 90 | Temp 98.4°F | Ht 66.5 in | Wt 188.0 lb

## 2020-11-26 DIAGNOSIS — I10 Essential (primary) hypertension: Secondary | ICD-10-CM | POA: Diagnosis not present

## 2020-11-26 LAB — COMPREHENSIVE METABOLIC PANEL
ALT: 15 U/L (ref 0–35)
AST: 17 U/L (ref 0–37)
Albumin: 3.9 g/dL (ref 3.5–5.2)
Alkaline Phosphatase: 114 U/L (ref 39–117)
BUN: 7 mg/dL (ref 6–23)
CO2: 32 mEq/L (ref 19–32)
Calcium: 9.3 mg/dL (ref 8.4–10.5)
Chloride: 104 mEq/L (ref 96–112)
Creatinine, Ser: 0.83 mg/dL (ref 0.40–1.20)
GFR: 77.25 mL/min (ref >60.00)
Glucose, Bld: 86 mg/dL (ref 70–99)
Potassium: 3.3 mEq/L — ABNORMAL LOW (ref 3.5–5.1)
Sodium: 143 mEq/L (ref 135–145)
Total Bilirubin: 0.3 mg/dL (ref 0.2–1.2)
Total Protein: 6.3 g/dL (ref 6.0–8.3)

## 2020-11-26 MED ORDER — AMLODIPINE BESYLATE 5 MG PO TABS: 5.0000 mg | ORAL_TABLET | Freq: Every day | ORAL | 0 refills | Status: DC

## 2020-11-26 MED ORDER — LISINOPRIL 40 MG PO TABS: 40.0000 mg | ORAL_TABLET | Freq: Every day | ORAL | 0 refills | Status: DC

## 2020-11-26 NOTE — Addendum Note (Signed)
Addended by: Waldemar Dickens B on: 11/26/2020 03:44 PM   Modules accepted: Orders

## 2020-11-26 NOTE — Patient Instructions (Signed)
Your blood pressure medications are now:  1) Amlodipine 5 mg Take one per day; 2) Lisinopril 40 mg Take one per day; 3) Metoprolol Tartrate 100 mg Take 1 tablet twice a day;

## 2020-11-26 NOTE — Progress Notes (Signed)
Jackie Odonnell is a 59 y.o. female with the following history as recorded in EpicCare:  Patient Active Problem List   Diagnosis Date Noted  . Prolonged QT interval 01/03/2020  . Microcytosis 04/01/2016  . Chronic cough 11/07/2014  . Schizophrenia, paranoid ([place]) 08/09/2012  . Tobacco abuse 08/06/2012  . Hyponatremia 08/05/2012  . Hypokalemia 08/05/2012  . Suicidal ideation 08/05/2012  . Essential hypertension 04/25/2009  . Asthma 10/18/2007  . PNEUMONIA, HX OF 10/18/2007    Current Outpatient Medications  Medication Sig Dispense Refill  . ARIPiprazole (ABILIFY) 20 MG tablet     . benztropine (COGENTIN) 0.5 MG tablet Take 0.5 mg by mouth 2 (two) times daily.    . busPIRone (BUSPAR) 10 MG tablet Take 10 mg by mouth 3 (three) times daily.   2  . hydrOXYzine (ATARAX/VISTARIL) 25 MG tablet     . lamoTRIgine (LAMICTAL) 100 MG tablet Take 100 mg by mouth 2 (two) times daily.    . metoprolol tartrate (LOPRESSOR) 100 MG tablet TAKE 1 TABLET BY MOUTH TWICE DAILY 180 tablet 3  . Multiple Vitamins-Iron (MULTIVITAMINS WITH IRON) TABS tablet Take 1 tablet by mouth daily.    . potassium chloride (KLOR-CON) 10 MEQ tablet Take 1 tablet (10 mEq total) by mouth daily. 5 tablet 0  . amLODipine (NORVASC) 5 MG tablet Take 1 tablet (5 mg total) by mouth daily. 30 tablet 0  . lisinopril (ZESTRIL) 40 MG tablet Take 1 tablet (40 mg total) by mouth daily. 90 tablet 0   No current facility-administered medications for this visit.    Allergies: Patient has no known allergies.  Past Medical History:  Diagnosis Date  . Anxiety   . Bipolar affective ([place])    Chemical Imbalance  . Depression   . Hypertension     Past Surgical History:  Procedure Laterality Date  . ABDOMINAL HYSTERECTOMY    . ECTOPIC PREGNANCY SURGERY    . intestines removed     scar tissue issue    Family History  Problem Relation Age of Onset  . Colon cancer Neg Hx   . Esophageal cancer Neg Hx   . Rectal cancer Neg Hx   .  Stomach cancer Neg Hx     Social History   Tobacco Use  . Smoking status: Current Every Day Smoker    Packs/day: 2.00    Years: 15.00    Pack years: 30.00    Types: Cigarettes  . Smokeless tobacco: Never Used  . Tobacco comment: Trying to cut back  Substance Use Topics  . Alcohol use: No    Alcohol/week: 0.0 standard drinks    Subjective:   Follow-up on hypertension; at last OV, patient's Lisinopril was increased to 20 mg bid and Metoprolol continued; patient is adamant that she is taking medication as prescribed- pressure is actually quite a bit higher today than a OV in September; she does not check her pressure regularly; Denies any chest pain, shortness of breath, blurred vision or headache.   Objective:  Vitals:   11/26/20 1520  BP: (!) 170/100  Pulse: 90  Temp: 98.4 F (36.9 C)  TempSrc: Oral  SpO2: 98%  Weight: 188 lb (85.3 kg)  Height: 5' 6.5" (1.689 m)    General: Well developed, well nourished, in no acute distress  Skin : Warm and dry.  Head: Normocephalic and atraumatic  Lungs: Respirations unlabored; clear to auscultation bilaterally without wheeze, rales, rhonchi  CVS exam: normal rate and regular rhythm.  Neurologic: Alert and oriented;  speech intact; face symmetrical; moves all extremities well; CNII-XII intact without focal deficit   Assessment:  1. Essential hypertension     Plan:  Uncontrolled; add Amlodipine 5 mg daily; change the Lisinopril to 40 mg qd; continue Metoprolol 100 mg bid; check CMP today; follow-up in 2 weeks; patient needs to quit smoking- she is not ready to quit; defers flu shot today as well;  This visit occurred during the SARS-CoV-2 public health emergency.  Safety protocols were in place, including screening questions prior to the visit, additional usage of staff PPE, and extensive cleaning of exam room while observing appropriate contact time as indicated for disinfecting solutions.     Return in about 2 weeks (around  12/10/2020).  Orders Placed This Encounter  Procedures  . Comp Met (CMET)    Standing Status:   Future    Standing Expiration Date:   11/26/2021    Requested Prescriptions   Signed Prescriptions Disp Refills  . amLODipine (NORVASC) 5 MG tablet 30 tablet 0    Sig: Take 1 tablet (5 mg total) by mouth daily.  Marland Kitchen lisinopril (ZESTRIL) 40 MG tablet 90 tablet 0    Sig: Take 1 tablet (40 mg total) by mouth daily.

## 2020-11-27 ENCOUNTER — Other Ambulatory Visit: Payer: Self-pay | Admitting: Family

## 2020-11-27 MED ORDER — POTASSIUM CHLORIDE ER 20 MEQ PO TBCR: 20.0000 meq | EXTENDED_RELEASE_TABLET | Freq: Every day | ORAL | 1 refills | Status: DC

## 2020-12-10 ENCOUNTER — Ambulatory Visit: Payer: Medicare Other | Admitting: Family

## 2020-12-24 ENCOUNTER — Ambulatory Visit: Payer: Medicare Other | Admitting: Family

## 2021-01-04 ENCOUNTER — Ambulatory Visit: Payer: Medicare Other | Admitting: Family

## 2021-01-08 ENCOUNTER — Telehealth: Payer: Self-pay | Admitting: Family

## 2021-01-08 NOTE — Telephone Encounter (Signed)
Patient wondering if she can get referred to the [facility] to help with transportation

## 2021-01-09 NOTE — Telephone Encounter (Signed)
Will discuss at upcoming visit on 01/11/21; we do not manage [facility] or their services however.

## 2021-01-11 ENCOUNTER — Ambulatory Visit: Payer: Medicare Other | Admitting: Family

## 2021-01-28 ENCOUNTER — Other Ambulatory Visit: Payer: Self-pay

## 2021-01-28 ENCOUNTER — Ambulatory Visit (INDEPENDENT_AMBULATORY_CARE_PROVIDER_SITE_OTHER): Payer: Medicare Other | Admitting: Family

## 2021-01-28 ENCOUNTER — Encounter: Payer: Self-pay | Admitting: Family

## 2021-01-28 VITALS — BP 150/90 | HR 85 | Temp 98.2°F | Ht 66.5 in | Wt 192.8 lb

## 2021-01-28 DIAGNOSIS — Z13220 Encounter for screening for lipoid disorders: Secondary | ICD-10-CM | POA: Diagnosis not present

## 2021-01-28 DIAGNOSIS — Z1211 Encounter for screening for malignant neoplasm of colon: Secondary | ICD-10-CM

## 2021-01-28 DIAGNOSIS — I10 Essential (primary) hypertension: Secondary | ICD-10-CM | POA: Diagnosis not present

## 2021-01-28 DIAGNOSIS — Z720 Tobacco use: Secondary | ICD-10-CM

## 2021-01-28 LAB — COMPREHENSIVE METABOLIC PANEL
ALT: 24 U/L (ref 0–35)
AST: 15 U/L (ref 0–37)
Albumin: 4.2 g/dL (ref 3.5–5.2)
Alkaline Phosphatase: 125 U/L — ABNORMAL HIGH (ref 39–117)
BUN: 8 mg/dL (ref 6–23)
CO2: 29 mEq/L (ref 19–32)
Calcium: 10.1 mg/dL (ref 8.4–10.5)
Chloride: 104 mEq/L (ref 96–112)
Creatinine, Ser: 0.81 mg/dL (ref 0.40–1.20)
GFR: 79.45 mL/min (ref >60.00)
Glucose, Bld: 92 mg/dL (ref 70–99)
Potassium: 4.4 mEq/L (ref 3.5–5.1)
Sodium: 138 mEq/L (ref 135–145)
Total Bilirubin: 0.2 mg/dL (ref 0.2–1.2)
Total Protein: 6.7 g/dL (ref 6.0–8.3)

## 2021-01-28 LAB — CBC WITH DIFFERENTIAL/PLATELET
Basophils Absolute: 0.1 10*3/uL (ref 0.0–0.1)
Basophils Relative: 0.7 % (ref 0.0–3.0)
Eosinophils Absolute: 0.1 10*3/uL (ref 0.0–0.7)
Eosinophils Relative: 0.8 % (ref 0.0–5.0)
HCT: 42 % (ref 36.0–46.0)
Hemoglobin: 13.3 g/dL (ref 12.0–15.0)
Lymphocytes Relative: 36.4 % (ref 12.0–46.0)
Lymphs Abs: 3.1 10*3/uL (ref 0.7–4.0)
MCHC: 31.8 g/dL (ref 30.0–36.0)
MCV: 71.9 fl — ABNORMAL LOW (ref 78.0–100.0)
Monocytes Absolute: 0.7 10*3/uL (ref 0.1–1.0)
Monocytes Relative: 7.7 % (ref 3.0–12.0)
Neutro Abs: 4.7 10*3/uL (ref 1.4–7.7)
Neutrophils Relative %: 54.4 % (ref 43.0–77.0)
Platelets: 290 10*3/uL (ref 150.0–400.0)
RBC: 5.84 Mil/uL — ABNORMAL HIGH (ref 3.87–5.11)
RDW: 17.6 % — ABNORMAL HIGH (ref 11.5–15.5)
WBC: 8.7 10*3/uL (ref 4.0–10.5)

## 2021-01-28 LAB — LIPID PANEL
Cholesterol: 140 mg/dL (ref 0–200)
HDL: 77.4 mg/dL (ref >39.00)
LDL Cholesterol: 36 mg/dL (ref 0–99)
NonHDL: 62.46
Total CHOL/HDL Ratio: 2
Triglycerides: 133 mg/dL (ref 0.0–149.0)
VLDL: 26.6 mg/dL (ref 0.0–40.0)

## 2021-01-28 MED ORDER — AMLODIPINE BESYLATE 10 MG PO TABS: 10.0000 mg | ORAL_TABLET | Freq: Every day | ORAL | 1 refills | Status: DC

## 2021-01-28 NOTE — Progress Notes (Signed)
Jackie Odonnell is a 60 y.o. female with the following history as recorded in EpicCare:  Patient Active Problem List   Diagnosis Date Noted  . Prolonged QT interval 01/03/2020  . Microcytosis 04/01/2016  . Chronic cough 11/07/2014  . Schizophrenia, paranoid ([place]) 08/09/2012  . Tobacco abuse 08/06/2012  . Hyponatremia 08/05/2012  . Hypokalemia 08/05/2012  . Suicidal ideation 08/05/2012  . Essential hypertension 04/25/2009  . Asthma 10/18/2007  . PNEUMONIA, HX OF 10/18/2007    Current Outpatient Medications  Medication Sig Dispense Refill  . ARIPiprazole (ABILIFY) 20 MG tablet     . benztropine (COGENTIN) 0.5 MG tablet Take 0.5 mg by mouth 2 (two) times daily.    . busPIRone (BUSPAR) 10 MG tablet Take 10 mg by mouth 3 (three) times daily.   2  . hydrOXYzine (ATARAX/VISTARIL) 25 MG tablet     . lamoTRIgine (LAMICTAL) 100 MG tablet Take 100 mg by mouth 2 (two) times daily.    Marland Kitchen lisinopril (ZESTRIL) 40 MG tablet Take 1 tablet (40 mg total) by mouth daily. 90 tablet 0  . metoprolol tartrate (LOPRESSOR) 100 MG tablet TAKE 1 TABLET BY MOUTH TWICE DAILY 180 tablet 3  . Multiple Vitamins-Iron (MULTIVITAMINS WITH IRON) TABS tablet Take 1 tablet by mouth daily.    . potassium chloride 20 MEQ TBCR Take 20 mEq by mouth daily. 90 tablet 1  . amLODipine (NORVASC) 10 MG tablet Take 1 tablet (10 mg total) by mouth daily. 90 tablet 1   No current facility-administered medications for this visit.    Allergies: Patient has no known allergies.  Past Medical History:  Diagnosis Date  . Anxiety   . Bipolar affective ([place])    Chemical Imbalance  . Depression   . Hypertension     Past Surgical History:  Procedure Laterality Date  . ABDOMINAL HYSTERECTOMY    . ECTOPIC PREGNANCY SURGERY    . intestines removed     scar tissue issue    Family History  Problem Relation Age of Onset  . Colon cancer Neg Hx   . Esophageal cancer Neg Hx   . Rectal cancer Neg Hx   . Stomach cancer Neg Hx      Social History   Tobacco Use  . Smoking status: Current Every Day Smoker    Packs/day: 2.00    Years: 15.00    Pack years: 30.00    Types: Cigarettes  . Smokeless tobacco: Never Used  . Tobacco comment: Trying to cut back  Substance Use Topics  . Alcohol use: No    Alcohol/week: 0.0 standard drinks    Subjective:   Follow-up on hypertension; at last OV, Amlodipine 5 mg was added; patient also admits she thinks she has been getting her Lisinopril dosage confused and may have been only taking 20 mg- not the 40 mg prescribed; Denies any chest pain, shortness of breath, blurred vision or headache  Overdue to see her GYN- told me last year that she is under the care of [facility] and will be making a follow-up there; she also plans to get her mammogram at [facility]- does not need an order;  Colonoscopy has been repeatedly discussed in the past and she has declined due to transportation issues; would be open to other options;   Objective:  Vitals:   01/28/21 1441 01/28/21 1513  BP: (!) 160/100 (!) 150/90  Pulse: 85   Temp: 98.2 F (36.8 C)   TempSrc: Oral   SpO2: 98%   Weight: 192  lb 12.8 oz (87.5 kg)   Height: 5' 6.5" (1.689 m)     General: Well developed, well nourished, in no acute distress  Head: Normocephalic and atraumatic  Lungs: Respirations unlabored; clear to auscultation bilaterally without wheeze, rales, rhonchi  CVS exam: normal rate and regular rhythm.  Neurologic: Alert and oriented; speech intact; face symmetrical; moves all extremities well; CNII-XII intact without focal deficit   Assessment:  1. Essential hypertension   2. Lipid screening   3. Colon cancer screening   4. Tobacco abuse     Plan:  1. Uncontrolled; Increase Amlodipine to 10 mg daily; continue Lisinopril 40 mg and Metoprolol 100 mg bid; check CMP today; follow-up in 1 month; 2. Check lipid panel; 3. Agrees to Cologuard- order placed; 4. Needs to quit smoking- does not want to quit at this  time.  She also notes she plans to see her GYN in follow-up  This visit occurred during the SARS-CoV-2 public health emergency.  Safety protocols were in place, including screening questions prior to the visit, additional usage of staff PPE, and extensive cleaning of exam room while observing appropriate contact time as indicated for disinfecting solutions.      No follow-ups on file.  Orders Placed This Encounter  Procedures  . CBC with Differential/Platelet    Standing Status:   Future    Number of Occurrences:   1    Standing Expiration Date:   01/28/2022  . Comp Met (CMET)    Standing Status:   Future    Number of Occurrences:   1    Standing Expiration Date:   01/28/2022  . Lipid panel    Standing Status:   Future    Number of Occurrences:   1    Standing Expiration Date:   01/28/2022  . Cologuard    Requested Prescriptions   Signed Prescriptions Disp Refills  . amLODipine (NORVASC) 10 MG tablet 90 tablet 1    Sig: Take 1 tablet (10 mg total) by mouth daily.

## 2021-01-29 ENCOUNTER — Telehealth: Payer: Self-pay

## 2021-01-29 ENCOUNTER — Ambulatory Visit: Payer: Medicare Other | Admitting: Family

## 2021-01-29 NOTE — Telephone Encounter (Signed)
Per patient she is already schedule for her third covid vaccine

## 2021-01-31 DIAGNOSIS — F419 Anxiety disorder, unspecified: Secondary | ICD-10-CM | POA: Diagnosis not present

## 2021-01-31 DIAGNOSIS — F17200 Nicotine dependence, unspecified, uncomplicated: Secondary | ICD-10-CM | POA: Diagnosis not present

## 2021-01-31 DIAGNOSIS — F250 Schizoaffective disorder, bipolar type: Secondary | ICD-10-CM | POA: Diagnosis not present

## 2021-02-19 ENCOUNTER — Other Ambulatory Visit: Payer: Self-pay | Admitting: Family

## 2021-02-25 ENCOUNTER — Other Ambulatory Visit: Payer: Self-pay

## 2021-02-25 ENCOUNTER — Encounter: Payer: Self-pay | Admitting: Family

## 2021-02-25 ENCOUNTER — Ambulatory Visit (INDEPENDENT_AMBULATORY_CARE_PROVIDER_SITE_OTHER): Payer: Medicare Other | Admitting: Family

## 2021-02-25 VITALS — BP 140/80 | HR 77 | Ht 66.5 in | Wt 195.0 lb

## 2021-02-25 DIAGNOSIS — I10 Essential (primary) hypertension: Secondary | ICD-10-CM | POA: Diagnosis not present

## 2021-02-25 DIAGNOSIS — F200 Paranoid schizophrenia: Secondary | ICD-10-CM

## 2021-02-25 DIAGNOSIS — E876 Hypokalemia: Secondary | ICD-10-CM

## 2021-02-25 MED ORDER — AMLODIPINE BESYLATE 10 MG PO TABS: 10.0000 mg | ORAL_TABLET | Freq: Every day | ORAL | 3 refills | Status: DC

## 2021-02-25 MED ORDER — LISINOPRIL 40 MG PO TABS: ORAL_TABLET | ORAL | 3 refills | Status: DC

## 2021-02-25 MED ORDER — POTASSIUM CHLORIDE ER 20 MEQ PO TBCR: 20.0000 meq | EXTENDED_RELEASE_TABLET | Freq: Every day | ORAL | 3 refills | Status: DC

## 2021-02-25 MED ORDER — METOPROLOL TARTRATE 100 MG PO TABS: 100.0000 mg | ORAL_TABLET | Freq: Two times a day (BID) | ORAL | 3 refills | Status: DC

## 2021-02-25 NOTE — Progress Notes (Signed)
Jackie Odonnell is a 60 y.o. female with the following history as recorded in [employer]:  Patient Active Problem List   Diagnosis Date Noted  . Prolonged QT interval 01/03/2020  . Microcytosis 04/01/2016  . Chronic cough 11/07/2014  . Schizophrenia, paranoid (HCC) 08/09/2012  . Tobacco abuse 08/06/2012  . Hyponatremia 08/05/2012  . Hypokalemia 08/05/2012  . Suicidal ideation 08/05/2012  . Essential hypertension 04/25/2009  . Asthma 10/18/2007  . PNEUMONIA, HX OF 10/18/2007    Current Outpatient Medications  Medication Sig Dispense Refill  . ARIPiprazole (ABILIFY) 20 MG tablet     . benztropine (COGENTIN) 0.5 MG tablet Take 0.5 mg by mouth 2 (two) times daily.    . busPIRone (BUSPAR) 10 MG tablet Take 10 mg by mouth 3 (three) times daily.   2  . hydrOXYzine (ATARAX/VISTARIL) 25 MG tablet     . lamoTRIgine (LAMICTAL) 100 MG tablet Take 100 mg by mouth 2 (two) times daily.    . Multiple Vitamins-Iron (MULTIVITAMINS WITH IRON) TABS tablet Take 1 tablet by mouth daily.    Marland Kitchen amLODipine (NORVASC) 10 MG tablet Take 1 tablet (10 mg total) by mouth daily. 90 tablet 3  . lisinopril (ZESTRIL) 40 MG tablet TAKE 1 TABLET(40 MG) BY MOUTH DAILY 90 tablet 3  . metoprolol tartrate (LOPRESSOR) 100 MG tablet Take 1 tablet (100 mg total) by mouth 2 (two) times daily. 180 tablet 3  . Potassium Chloride ER 20 MEQ TBCR Take 20 mEq by mouth daily. 90 tablet 3   No current facility-administered medications for this visit.    Allergies: Patient has no known allergies.  Past Medical History:  Diagnosis Date  . Anxiety   . Bipolar affective (HCC)    Chemical Imbalance  . Depression   . Hypertension     Past Surgical History:  Procedure Laterality Date  . ABDOMINAL HYSTERECTOMY    . ECTOPIC PREGNANCY SURGERY    . intestines removed     scar tissue issue    Family History  Problem Relation Age of Onset  . Colon cancer Neg Hx   . Esophageal cancer Neg Hx   . Rectal cancer Neg Hx   . Stomach  cancer Neg Hx     Social History   Tobacco Use  . Smoking status: Current Every Day Smoker    Packs/day: 2.00    Years: 15.00    Pack years: 30.00    Types: Cigarettes  . Smokeless tobacco: Never Used  . Tobacco comment: Trying to cut back  Substance Use Topics  . Alcohol use: No    Alcohol/week: 0.0 standard drinks    Subjective:  Follow up on hypertension; does get nervous at OV; Denies any chest pain, shortness of breath, blurred vision or headache  Has completed Cologuard/ GYN exam is scheduled for later this month;  Continuing to see psychiatrist regularly;   Objective:  Vitals:   02/25/21 1456 02/25/21 1512  BP: (!) 146/80 140/80  Pulse: 77   SpO2: 97%   Weight: 195 lb (88.5 kg)   Height: 5' 6.5" (1.689 m)     General: Well developed, well nourished, in no acute distress  Skin : Warm and dry.  Head: Normocephalic and atraumatic  Eyes: Sclera and conjunctiva clear; pupils round and reactive to light; extraocular movements intact  Ears: External normal; canals clear; tympanic membranes normal  Oropharynx: Pink, supple. No suspicious lesions  Neck: Supple without thyromegaly, adenopathy  Lungs: Respirations unlabored; clear to auscultation bilaterally without wheeze, rales,  rhonchi  CV[facility] exam: normal rate and regular rhythm.  Neurologic: Alert and oriented; speech intact; face symmetrical; moves all extremities well; CNII-XII intact without focal deficit   Assessment:  1. Essential hypertension   2. Hypokalemia   3. Schizophrenia, paranoid (HCC)     Plan:  1. Stable; improved- suspect white coat hypertension as well; continue same medications; refills updated; plan to see new PCP in 6 months; 2. Reviewed labs from February- normal potassium at that time; continue supplement; 3. Continue with psychiatrist;  Cologuard is pending and scheduled to see GYN later this month; she will also schedule for her AWV- okay to do as phone call due to transportation issues.    This visit occurred during the SARS-CoV-2 public health emergency.  Safety protocols were in place, including screening questions prior to the visit, additional usage of staff PPE, and extensive cleaning of exam room while observing appropriate contact time as indicated for disinfecting solutions.      Return for Schedule AWV- phone call please.  No orders of the defined types were placed in this encounter.   Requested Prescriptions   Signed Prescriptions Disp Refills  . metoprolol tartrate (LOPRESSOR) 100 MG tablet 180 tablet 3    Sig: Take 1 tablet (100 mg total) by mouth 2 (two) times daily.  Marland Kitchen amLODipine (NORVASC) 10 MG tablet 90 tablet 3    Sig: Take 1 tablet (10 mg total) by mouth daily.  Marland Kitchen lisinopril (ZESTRIL) 40 MG tablet 90 tablet 3    Sig: TAKE 1 TABLET(40 MG) BY MOUTH DAILY  . Potassium Chloride ER 20 MEQ TBCR 90 tablet 3    Sig: Take 20 mEq by mouth daily.

## 2021-02-27 LAB — COLOGUARD

## 2021-03-04 DIAGNOSIS — Z1211 Encounter for screening for malignant neoplasm of colon: Secondary | ICD-10-CM | POA: Diagnosis not present

## 2021-03-04 LAB — COLOGUARD: Cologuard: NEGATIVE

## 2021-03-05 ENCOUNTER — Telehealth: Payer: Self-pay | Admitting: Family

## 2021-03-05 DIAGNOSIS — L602 Onychogryphosis: Secondary | ICD-10-CM

## 2021-03-05 NOTE — Telephone Encounter (Signed)
Pt was last seen on 02/25/21  Pt is now asking if we will refer her to the podiatrist so she can get her toenails cut and cleaned.   Her nails are thick and are curling. I have pended the referral. Ready to be signed if appropriate.

## 2021-03-05 NOTE — Telephone Encounter (Signed)
Patient is wondering If she can get a referral for podiatry to get her toe nails clipped

## 2021-03-06 NOTE — Telephone Encounter (Signed)
I have called pt and informed her that the referral has been placed and that office should give pt a call. She stated understanding.

## 2021-03-06 NOTE — Telephone Encounter (Signed)
Referral done; that office should contact her to schedule;

## 2021-03-09 LAB — COLOGUARD: COLOGUARD: NEGATIVE

## 2021-03-13 ENCOUNTER — Ambulatory Visit: Payer: Medicare Other

## 2021-03-26 ENCOUNTER — Ambulatory Visit: Payer: Medicare Other

## 2021-03-26 ENCOUNTER — Telehealth: Payer: Self-pay

## 2021-03-26 ENCOUNTER — Other Ambulatory Visit: Payer: Self-pay

## 2021-03-26 NOTE — Telephone Encounter (Signed)
Left voicemail for patient to return call to office for her telephone AWV, that was schedule for today at 1:15p.  No return call.

## 2021-04-08 ENCOUNTER — Telehealth: Payer: Self-pay | Admitting: Family

## 2021-04-08 ENCOUNTER — Encounter: Payer: Self-pay | Admitting: Family

## 2021-04-08 NOTE — Telephone Encounter (Signed)
Please let her know that Cologuard was negative; re-check in 3 years;

## 2021-04-09 NOTE — Telephone Encounter (Signed)
I have called pt and informed her of provider message below. Pt answered and understood the results. No additional questions at this time.

## 2021-04-12 ENCOUNTER — Ambulatory Visit (INDEPENDENT_AMBULATORY_CARE_PROVIDER_SITE_OTHER): Payer: Medicare Other

## 2021-04-12 DIAGNOSIS — Z0000 Encounter for general adult medical examination without abnormal findings: Secondary | ICD-10-CM | POA: Diagnosis not present

## 2021-04-12 NOTE — Patient Instructions (Addendum)
Jackie Odonnell , Thank you for taking time to come for your Medicare Wellness Visit. I appreciate your ongoing commitment to your health goals. Please review the following plan we discussed and let me know if I can assist you in the future.   Screening recommendations/referrals: Colonoscopy: 03/04/2021; due every 3 years (Cologuard) Mammogram: scheduled for 04/2021 at OB/GYN office Bone Density: 09/04/2015 Recommended yearly ophthalmology/optometry visit for glaucoma screening and checkup Recommended yearly dental visit for hygiene and checkup  Vaccinations: Influenza vaccine: declined Pneumococcal vaccine: declined Tdap vaccine: declined Shingles vaccine: declined  Covid-19: 04/28/2020, 05/19/2020  Advanced directives: Advance directive discussed with you today. Even though you declined this today please call our office should you change your mind and we can give you the proper paperwork for you to fill out.  Conditions/risks identified: Yes. Reviewed health maintenance screenings with patient today and relevant education, vaccines, and/or referrals were provided. Continue doing brain stimulating activities (puzzles, reading, adult coloring books, staying active) to keep memory sharp. Continue to eat heart healthy diet (full of fruits, vegetables, whole grains, lean protein, water--limit salt, fat, and sugar intake) and increase physical activity as tolerated.  Next appointment: Please schedule your next Medicare Wellness Visit with your Nurse Health Advisor in 1 year by calling [phone].  Preventive Care 40-64 Years, Female Preventive care refers to lifestyle choices and visits with your health care provider that can promote health and wellness.  What does preventive care include?  A yearly physical exam. This is also called an annual well check.  Dental exams once or twice a year.  Routine eye exams. Ask your health care provider how often you should have your eyes checked.  Personal  lifestyle choices, including:  Daily care of your teeth and gums.  Regular physical activity.  Eating a healthy diet.  Avoiding tobacco and drug use.  Limiting alcohol use.  Practicing safe sex.  Taking low-dose aspirin daily starting at age 51.  Taking vitamin and mineral supplements as recommended by your health care provider. What happens during an annual well check? The services and screenings done by your health care provider during your annual well check will depend on your age, overall health, lifestyle risk factors, and family history of disease. Counseling  Your health care provider may ask you questions about your:  Alcohol use.  Tobacco use.  Drug use.  Emotional well-being.  Home and relationship well-being.  Sexual activity.  Eating habits.  Work and work Statistician.  Method of birth control.  Menstrual cycle.  Pregnancy history. Screening  You may have the following tests or measurements:  Height, weight, and BMI.  Blood pressure.  Lipid and cholesterol levels. These may be checked every 5 years, or more frequently if you are over 21 years old.  Skin check.  Lung cancer screening. You may have this screening every year starting at age 74 if you have a 30-pack-year history of smoking and currently smoke or have quit within the past 15 years.  Fecal occult blood test (FOBT) of the stool. You may have this test every year starting at age 84.  Flexible sigmoidoscopy or colonoscopy. You may have a sigmoidoscopy every 5 years or a colonoscopy every 10 years starting at age 43.  Hepatitis C blood test.  Hepatitis B blood test.  Sexually transmitted disease (STD) testing.  Diabetes screening. This is done by checking your blood sugar (glucose) after you have not eaten for a while (fasting). You may have this done every 1-3 years.  Mammogram. This may be done every 1-2 years. Talk to your health care provider about when you should start having  regular mammograms. This may depend on whether you have a family history of breast cancer.  BRCA-related cancer screening. This may be done if you have a family history of breast, ovarian, tubal, or peritoneal cancers.  Pelvic exam and Pap test. This may be done every 3 years starting at age 45. Starting at age 45, this may be done every 5 years if you have a Pap test in combination with an HPV test.  Bone density scan. This is done to screen for osteoporosis. You may have this scan if you are at high risk for osteoporosis. Discuss your test results, treatment options, and if necessary, the need for more tests with your health care provider. Vaccines  Your health care provider may recommend certain vaccines, such as:  Influenza vaccine. This is recommended every year.  Tetanus, diphtheria, and acellular pertussis (Tdap, Td) vaccine. You may need a Td booster every 10 years.  Zoster vaccine. You may need this after age 71.  Pneumococcal 13-valent conjugate (PCV13) vaccine. You may need this if you have certain conditions and were not previously vaccinated.  Pneumococcal polysaccharide (PPSV23) vaccine. You may need one or two doses if you smoke cigarettes or if you have certain conditions. Talk to your health care provider about which screenings and vaccines you need and how often you need them. This information is not intended to replace advice given to you by your health care provider. Make sure you discuss any questions you have with your health care provider. Document Released: 01/04/2016 Document Revised: 08/27/2016 Document Reviewed: 10/09/2015 Elsevier Interactive Patient Education  2017 [place] Prevention in the Home Falls can cause injuries. They can happen to people of all ages. There are many things you can do to make your home safe and to help prevent falls. What can I do on the outside of my home?  Regularly fix the edges of walkways and driveways and fix any  cracks.  Remove anything that might make you trip as you walk through a door, such as a raised step or threshold.  Trim any bushes or trees on the path to your home.  Use bright outdoor lighting.  Clear any walking paths of anything that might make someone trip, such as rocks or tools.  Regularly check to see if handrails are loose or broken. Make sure that both sides of any steps have handrails.  Any raised decks and porches should have guardrails on the edges.  Have any leaves, snow, or ice cleared regularly.  Use sand or salt on walking paths during winter.  Clean up any spills in your garage right away. This includes oil or grease spills. What can I do in the bathroom?  Use night lights.  Install grab bars by the toilet and in the tub and shower. Do not use towel bars as grab bars.  Use non-skid mats or decals in the tub or shower.  If you need to sit down in the shower, use a plastic, non-slip stool.  Keep the floor dry. Clean up any water that spills on the floor as soon as it happens.  Remove soap buildup in the tub or shower regularly.  Attach bath mats securely with double-sided non-slip rug tape.  Do not have throw rugs and other things on the floor that can make you trip. What can I do in the bedroom?  Use night lights.  Make sure that you have a light by your bed that is easy to reach.  Do not use any sheets or blankets that are too big for your bed. They should not hang down onto the floor.  Have a firm chair that has side arms. You can use this for support while you get dressed.  Do not have throw rugs and other things on the floor that can make you trip. What can I do in the kitchen?  Clean up any spills right away.  Avoid walking on wet floors.  Keep items that you use a lot in easy-to-reach places.  If you need to reach something above you, use a strong step stool that has a grab bar.  Keep electrical cords out of the way.  Do not use floor  polish or wax that makes floors slippery. If you must use wax, use non-skid floor wax.  Do not have throw rugs and other things on the floor that can make you trip. What can I do with my stairs?  Do not leave any items on the stairs.  Make sure that there are handrails on both sides of the stairs and use them. Fix handrails that are broken or loose. Make sure that handrails are as long as the stairways.  Check any carpeting to make sure that it is firmly attached to the stairs. Fix any carpet that is loose or worn.  Avoid having throw rugs at the top or bottom of the stairs. If you do have throw rugs, attach them to the floor with carpet tape.  Make sure that you have a light switch at the top of the stairs and the bottom of the stairs. If you do not have them, ask someone to add them for you. What else can I do to help prevent falls?  Wear shoes that:  Do not have high heels.  Have rubber bottoms.  Are comfortable and fit you well.  Are closed at the toe. Do not wear sandals.  If you use a stepladder:  Make sure that it is fully opened. Do not climb a closed stepladder.  Make sure that both sides of the stepladder are locked into place.  Ask someone to hold it for you, if possible.  Clearly mark and make sure that you can see:  Any grab bars or handrails.  First and last steps.  Where the edge of each step is.  Use tools that help you move around (mobility aids) if they are needed. These include:  Canes.  Walkers.  Scooters.  Crutches.  Turn on the lights when you go into a dark area. Replace any light bulbs as soon as they burn out.  Set up your furniture so you have a clear path. Avoid moving your furniture around.  If any of your floors are uneven, fix them.  If there are any pets around you, be aware of where they are.  Review your medicines with your doctor. Some medicines can make you feel dizzy. This can increase your chance of falling. Ask your  doctor what other things that you can do to help prevent falls. This information is not intended to replace advice given to you by your health care provider. Make sure you discuss any questions you have with your health care provider. Document Released: 10/04/2009 Document Revised: 05/15/2016 Document Reviewed: 01/12/2015 Elsevier Interactive Patient Education  2017 [employer].

## 2021-04-12 NOTE — Progress Notes (Signed)
I connected with Jackie Odonnell today by telephone and verified that I am speaking with the correct person using two identifiers. Location patient: home Location provider: work Persons participating in the virtual visit:Vennie Worthy and [employer], LPN.   I discussed the limitations, risks, security and privacy concerns of performing an evaluation and management service by telephone and the availability of in person appointments. I also discussed with the patient that there may be a patient responsible charge related to this service. The patient expressed understanding and verbally consented to this telephonic visit.    Interactive audio and video telecommunications were attempted between this provider and patient, however failed, due to patient having technical difficulties OR patient did not have access to video capability.  We continued and completed visit with audio only.  Some vital signs may be absent or patient reported.   Time Spent with patient on telephone encounter: 30 minutes  Subjective:   Jackie Odonnell is a 59 y.o. female who presents for Medicare Annual (Subsequent) preventive examination.  Review of Systems    No ROS. Medicare Wellness Visit. Additional risk factors are reflected in social history. Cardiac Risk Factors include: hypertension;smoking/ tobacco exposure     Objective:    There were no vitals filed for this visit. There is no height or weight on file to calculate BMI.  Advanced Directives 04/12/2021 01/02/2020 11/25/2018 06/16/2017 08/06/2012  Does Patient Have a Medical Advance Directive? No No No No Patient does not have advance directive  Would patient like information on creating a medical advance directive? No - Patient declined Yes (ED - Information included in AVS) No - Patient declined Yes (ED - Information included in AVS) -  Pre-existing out of facility DNR order (yellow form or pink MOST form) - - - - No  Some encounter information is  confidential and restricted. Go to Review Flowsheets activity to see all data.    Current Medications (verified) Outpatient Encounter Medications as of 04/12/2021  Medication Sig  . amLODipine (NORVASC) 10 MG tablet Take 1 tablet (10 mg total) by mouth daily.  . ARIPiprazole (ABILIFY) 20 MG tablet   . benztropine (COGENTIN) 0.5 MG tablet Take 0.5 mg by mouth 2 (two) times daily.  . busPIRone (BUSPAR) 10 MG tablet Take 10 mg by mouth 3 (three) times daily.   . hydrOXYzine (ATARAX/VISTARIL) 25 MG tablet   . lamoTRIgine (LAMICTAL) 100 MG tablet Take 100 mg by mouth 2 (two) times daily.  Marland Kitchen lisinopril (ZESTRIL) 40 MG tablet TAKE 1 TABLET(40 MG) BY MOUTH DAILY  . metoprolol tartrate (LOPRESSOR) 100 MG tablet Take 1 tablet (100 mg total) by mouth 2 (two) times daily.  . Multiple Vitamins-Iron (MULTIVITAMINS WITH IRON) TABS tablet Take 1 tablet by mouth daily.  . Potassium Chloride ER 20 MEQ TBCR Take 20 mEq by mouth daily.   No facility-administered encounter medications on file as of 04/12/2021.    Allergies (verified) Patient has no known allergies.   History: Past Medical History:  Diagnosis Date  . Anxiety   . Bipolar affective (HCC)    Chemical Imbalance  . Depression   . Hypertension    Past Surgical History:  Procedure Laterality Date  . ABDOMINAL HYSTERECTOMY    . ECTOPIC PREGNANCY SURGERY    . intestines removed     scar tissue issue   Family History  Problem Relation Age of Onset  . Colon cancer Neg Hx   . Esophageal cancer Neg Hx   . Rectal cancer Neg  Hx   . Stomach cancer Neg Hx    Social History   Socioeconomic History  . Marital status: Single    Spouse name: Not on file  . Number of children: 0  . Years of education: 36  . Highest education level: Not on file  Occupational History  . Occupation: Disability    Comment: Schizophrenia  Tobacco Use  . Smoking status: Current Every Day Smoker    Packs/day: 2.00    Years: 15.00    Pack years: 30.00     Types: Cigarettes  . Smokeless tobacco: Never Used  . Tobacco comment: Trying to cut back  Vaping Use  . Vaping Use: Never used  Substance and Sexual Activity  . Alcohol use: No    Alcohol/week: 0.0 standard drinks  . Drug use: No  . Sexual activity: Not Currently  Other Topics Concern  . Not on file  Social History Narrative   Born and raised in [place], [place]. Currently resides in an apartment by herself. No pets. Fun: Watching tv and walking. Denies religious/spiritual beliefs effecting health care.    Social Determinants of Health   Financial Resource Strain: Low Risk   . Difficulty of Paying Living Expenses: Not hard at all  Food Insecurity: No Food Insecurity  . Worried About Programme researcher, broadcasting/film/video in the Last Year: Never true  . Ran Out of Food in the Last Year: Never true  Transportation Needs: No Transportation Needs  . Lack of Transportation (Medical): No  . Lack of Transportation (Non-Medical): No  Physical Activity: Sufficiently Active  . Days of Exercise per Week: 5 days  . Minutes of Exercise per Session: 30 min  Stress: No Stress Concern Present  . Feeling of Stress : Not at all  Social Connections: Socially Isolated  . Frequency of Communication with Friends and Family: More than three times a week  . Frequency of Social Gatherings with Friends and Family: Once a week  . Attends Religious Services: Never  . Active Member of Clubs or Organizations: No  . Attends Banker Meetings: Never  . Marital Status: Never married    Tobacco Counseling Ready to quit: Not Answered Counseling given: Not Answered Comment: Trying to cut back   Clinical Intake:  Pre-visit preparation completed: Yes  Pain : No/denies pain     Nutritional Risks: None Diabetes: No  How often do you need to have someone help you when you read instructions, pamphlets, or other written materials from your doctor or pharmacy?: 1 - Never What is the last grade level you  completed in school?: 3 years of college  Diabetic? no  Interpreter Needed?: No  Information entered by :: Susie Cassette, LPN   Activities of Daily Living In your present state of health, do you have any difficulty performing the following activities: 04/12/2021  Hearing? N  Vision? N  Difficulty concentrating or making decisions? N  Walking or climbing stairs? N  Dressing or bathing? N  Doing errands, shopping? N  Preparing Food and eating ? N  Using the Toilet? N  In the past six months, have you accidently leaked urine? N  Do you have problems with loss of bowel control? N  Managing your Medications? N  Managing your Finances? N  Housekeeping or managing your Housekeeping? N  Some recent data might be hidden    Patient Care Team: Olive Bass, FNP as PCP - General (Internal Medicine)  Indicate any recent Medical Services you may have  received from other than [facility] providers in the past year (date may be approximate).     Assessment:   This is a routine wellness examination for [place].  Hearing/Vision screen No exam data present  Dietary issues and exercise activities discussed: Current Exercise Habits: Home exercise routine, Type of exercise: walking, Time (Minutes): 30, Frequency (Times/Week): 5, Weekly Exercise (Minutes/Week): 150, Intensity: Moderate, Exercise limited by: psychological condition(s);respiratory conditions(s)  Goals    . <enter goal here>    . [place] to walk 3 times a week and  save my money for a car    . Patient Stated     I will drink more water and gear up to prepare myself to quit smoking.      Depression Screen PHQ 2/9 Scores 04/12/2021 04/12/2021 01/28/2021 11/25/2018 06/16/2017  PHQ - 2 Score 0 0 0 2 2  PHQ- 9 Score - - - 3 5    Fall Risk Fall Risk  04/12/2021 11/25/2018 06/16/2017  Falls in the past year? 1 0 No  Number falls in past yr: 0 - -  Injury with Fall? 0 - -  Follow up Falls evaluation completed - -    FALL RISK  PREVENTION PERTAINING TO THE HOME:  Any stairs in or around the home? No  If so, are there any without handrails? No  Home free of loose throw rugs in walkways, pet beds, electrical cords, etc? Yes  Adequate lighting in your home to reduce risk of falls? Yes   ASSISTIVE DEVICES UTILIZED TO PREVENT FALLS:  Life alert? No  Use of a cane, walker or w/c? No  Grab bars in the bathroom? No  Shower chair or bench in shower? No  Elevated toilet seat or a handicapped toilet? No   TIMED UP AND GO:  Was the test performed? No .  Length of time to ambulate 10 feet: 0 sec.   Gait steady and fast without use of assistive device  Cognitive Function: No flowsheet data found.         Immunizations Immunization History  Administered Date(s) Administered  . Influenza,inj,Quad PF,6+ Mos 11/07/2014, 10/04/2015, 11/25/2018  . PFIZER(Purple Top)SARS-COV-2 Vaccination 04/28/2020, 05/19/2020  . Td 09/22/1999, 04/25/2009    TDAP status: Due, Education has been provided regarding the importance of this vaccine. Advised may receive this vaccine at local pharmacy or Health Dept. Aware to provide a copy of the vaccination record if obtained from local pharmacy or Health Dept. Verbalized acceptance and understanding.  Flu Vaccine status: Declined, Education has been provided regarding the importance of this vaccine but patient still declined. Advised may receive this vaccine at local pharmacy or Health Dept. Aware to provide a copy of the vaccination record if obtained from local pharmacy or Health Dept. Verbalized acceptance and understanding.  Pneumococcal vaccine status: Declined,  Education has been provided regarding the importance of this vaccine but patient still declined. Advised may receive this vaccine at local pharmacy or Health Dept. Aware to provide a copy of the vaccination record if obtained from local pharmacy or Health Dept. Verbalized acceptance and understanding.   Covid-19 vaccine  status: Completed vaccines  Qualifies for Shingles Vaccine? Yes   Zostavax completed No   Shingrix Completed?: No.    Education has been provided regarding the importance of this vaccine. Patient has been advised to call insurance company to determine out of pocket expense if they have not yet received this vaccine. Advised may also receive vaccine at local pharmacy or Health Dept. Verbalized  acceptance and understanding.  Screening Tests Health Maintenance  Topic Date Due  . TETANUS/TDAP  04/26/2019  . PAP SMEAR-Modifier  09/21/2020  . COVID-19 Vaccine (3 - Booster for Pfizer series) 11/19/2020  . INFLUENZA VACCINE  07/22/2021  . MAMMOGRAM  01/28/2023  . Fecal DNA (Cologuard)  03/04/2024  . Hepatitis C Screening  Completed  . HIV Screening  Completed  . HPV VACCINES  Aged Out    Health Maintenance  Health Maintenance Due  Topic Date Due  . TETANUS/TDAP  04/26/2019  . PAP SMEAR-Modifier  09/21/2020  . COVID-19 Vaccine (3 - Booster for Pfizer series) 11/19/2020    Colorectal cancer screening: Type of screening: Cologuard. Completed 03/04/2021. Repeat every 3 years  Mammogram status: Completed 09/04/2015. Repeat every year  Bone Density status: Completed 09/04/2015. Results reflect: Bone density results: OSTEOPENIA. Repeat every 2 years.  Lung Cancer Screening: (Low Dose CT Chest recommended if Age 46-80 years, 30 pack-year currently smoking OR have quit w/in 15years.) does qualify.   Lung Cancer Screening Referral: no  Additional Screening:  Hepatitis C Screening: does qualify; Completed yes  Vision Screening: Recommended annual ophthalmology exams for early detection of glaucoma and other disorders of the eye. Is the patient up to date with their annual eye exam?  No  Who is the provider or what is the name of the office in which the patient attends annual eye exams? Jethro BolusMark Shapiro, MD If pt is not established with a provider, would they like to be referred to a provider to  establish care? No .   Dental Screening: Recommended annual dental exams for proper oral hygiene  Community Resource Referral / Chronic Care Management: CRR required this visit?  No   CCM required this visit?  No      Plan:     I have personally reviewed and noted the following in the patient's chart:   . Medical and social history . Use of alcohol, tobacco or illicit drugs  . Current medications and supplements . Functional ability and status . Nutritional status . Physical activity . Advanced directives . List of other physicians . Hospitalizations, surgeries, and ER visits in previous 12 months . Vitals . Screenings to include cognitive, depression, and falls . Referrals and appointments  In addition, I have reviewed and discussed with patient certain preventive protocols, quality metrics, and best practice recommendations. A written personalized care plan for preventive services as well as general preventive health recommendations were provided to patient.     Mickeal NeedyShenika N Milinda Sweeney, LPN   1/61/09604/22/2022   Nurse Notes:  Patient is cogitatively intact. There were no vitals filed for this visit. There is no height or weight on file to calculate BMI. Patient stated that she has no issues with gait or balance; does not use any assistive devices. Medications reviewed with patient; no opioid use noted.

## 2021-04-16 ENCOUNTER — Ambulatory Visit: Payer: Medicare Other | Admitting: Podiatry

## 2021-05-02 DIAGNOSIS — F17200 Nicotine dependence, unspecified, uncomplicated: Secondary | ICD-10-CM | POA: Diagnosis not present

## 2021-05-02 DIAGNOSIS — F250 Schizoaffective disorder, bipolar type: Secondary | ICD-10-CM | POA: Diagnosis not present

## 2021-05-02 DIAGNOSIS — F419 Anxiety disorder, unspecified: Secondary | ICD-10-CM | POA: Diagnosis not present

## 2021-05-06 ENCOUNTER — Ambulatory Visit: Payer: Medicare Other | Admitting: Podiatry

## 2021-05-27 ENCOUNTER — Ambulatory Visit: Payer: Medicare Other | Admitting: Podiatry

## 2021-08-27 ENCOUNTER — Ambulatory Visit (INDEPENDENT_AMBULATORY_CARE_PROVIDER_SITE_OTHER): Payer: Medicare Other | Admitting: Internal Medicine

## 2021-08-27 ENCOUNTER — Encounter: Payer: Self-pay | Admitting: Internal Medicine

## 2021-08-27 ENCOUNTER — Other Ambulatory Visit: Payer: Self-pay

## 2021-08-27 VITALS — BP 122/82 | HR 110 | Temp 97.9°F | Resp 18 | Ht 66.5 in | Wt 206.0 lb

## 2021-08-27 DIAGNOSIS — Z23 Encounter for immunization: Secondary | ICD-10-CM

## 2021-08-27 DIAGNOSIS — J410 Simple chronic bronchitis: Secondary | ICD-10-CM | POA: Diagnosis not present

## 2021-08-27 DIAGNOSIS — Z114 Encounter for screening for human immunodeficiency virus [HIV]: Secondary | ICD-10-CM

## 2021-08-27 DIAGNOSIS — E559 Vitamin D deficiency, unspecified: Secondary | ICD-10-CM | POA: Diagnosis not present

## 2021-08-27 DIAGNOSIS — R7301 Impaired fasting glucose: Secondary | ICD-10-CM | POA: Diagnosis not present

## 2021-08-27 DIAGNOSIS — J4520 Mild intermittent asthma, uncomplicated: Secondary | ICD-10-CM

## 2021-08-27 DIAGNOSIS — R490 Dysphonia: Secondary | ICD-10-CM | POA: Diagnosis not present

## 2021-08-27 DIAGNOSIS — Z206 Contact with and (suspected) exposure to human immunodeficiency virus [HIV]: Secondary | ICD-10-CM

## 2021-08-27 DIAGNOSIS — I10 Essential (primary) hypertension: Secondary | ICD-10-CM | POA: Diagnosis not present

## 2021-08-27 DIAGNOSIS — R053 Chronic cough: Secondary | ICD-10-CM

## 2021-08-27 DIAGNOSIS — R200 Anesthesia of skin: Secondary | ICD-10-CM | POA: Diagnosis not present

## 2021-08-27 DIAGNOSIS — R202 Paresthesia of skin: Secondary | ICD-10-CM | POA: Diagnosis not present

## 2021-08-27 LAB — HEMOGLOBIN A1C: Hgb A1c MFr Bld: 5.9 % (ref 4.6–6.5)

## 2021-08-27 LAB — VITAMIN D 25 HYDROXY (VIT D DEFICIENCY, FRACTURES): VITD: 25.05 ng/mL — ABNORMAL LOW (ref 30.00–100.00)

## 2021-08-27 LAB — VITAMIN B12: Vitamin B-12: 371 pg/mL (ref 211–911)

## 2021-08-27 LAB — TSH: TSH: 1.86 u[IU]/mL (ref 0.35–5.50)

## 2021-08-27 NOTE — Progress Notes (Signed)
   Subjective:   Patient ID: Jackie Odonnell, female    DOB: 01/18/1961, 60 y.o.   MRN: [id number]  HPI The patient is a 60 YO female coming in for transition of care and ongoing medical care.  PMH, [facility], social history reviewed and updated  Review of Systems  Constitutional: Negative.   HENT: Negative.    Eyes: Negative.   Respiratory:  Negative for cough, chest tightness and shortness of breath.   Cardiovascular:  Negative for chest pain, palpitations and leg swelling.  Gastrointestinal:  Negative for abdominal distention, abdominal pain, constipation, diarrhea, nausea and vomiting.  Musculoskeletal: Negative.   Skin: Negative.   Neurological:  Positive for numbness.  Psychiatric/Behavioral: Negative.     Objective:  Physical Exam Constitutional:      Appearance: She is well-developed.  HENT:     Head: Normocephalic and atraumatic.  Cardiovascular:     Rate and Rhythm: Normal rate and regular rhythm.  Pulmonary:     Effort: Pulmonary effort is normal. No respiratory distress.     Breath sounds: Normal breath sounds. No wheezing or rales.  Abdominal:     General: Bowel sounds are normal. There is no distension.     Palpations: Abdomen is soft.     Tenderness: There is no abdominal tenderness. There is no rebound.  Musculoskeletal:     Cervical back: Normal range of motion.  Skin:    General: Skin is warm and dry.  Neurological:     Mental Status: She is alert and oriented to person, place, and time.     Coordination: Coordination normal.    Vitals:   08/27/21 1500  BP: 122/82  Pulse: (!) 110  Resp: 18  Temp: 97.9 F (36.6 C)  TempSrc: Oral  SpO2: 97%  Weight: 206 lb (93.4 kg)  Height: 5' 6.5" (1.689 m)    This visit occurred during the SARS-CoV-2 public health emergency.  Safety protocols were in place, including screening questions prior to the visit, additional usage of staff PPE, and extensive cleaning of exam room while observing appropriate contact time  as indicated for disinfecting solutions.   Assessment & Plan:  Flu shot given at visit

## 2021-08-27 NOTE — Patient Instructions (Addendum)
We will check the labs today to check the numbness.

## 2021-08-28 LAB — HIV ANTIBODY (ROUTINE TESTING W REFLEX): HIV 1&2 Ab, 4th Generation: NONREACTIVE

## 2021-08-29 DIAGNOSIS — F419 Anxiety disorder, unspecified: Secondary | ICD-10-CM | POA: Diagnosis not present

## 2021-08-29 DIAGNOSIS — F250 Schizoaffective disorder, bipolar type: Secondary | ICD-10-CM | POA: Diagnosis not present

## 2021-08-29 DIAGNOSIS — F17200 Nicotine dependence, unspecified, uncomplicated: Secondary | ICD-10-CM | POA: Diagnosis not present

## 2021-08-30 DIAGNOSIS — R200 Anesthesia of skin: Secondary | ICD-10-CM | POA: Insufficient documentation

## 2021-08-30 DIAGNOSIS — R490 Dysphonia: Secondary | ICD-10-CM | POA: Insufficient documentation

## 2021-08-30 NOTE — Assessment & Plan Note (Signed)
She is working on quitting and we talked about strategies to help.

## 2021-08-30 NOTE — Assessment & Plan Note (Addendum)
Checking vitamin D, B12, HgA1c, TSH. Recent CBC, CMP were normal. This will help to rule out metabolic causes. Treat as appropriate.

## 2021-08-30 NOTE — Assessment & Plan Note (Signed)
BP at goal on amlodipine and lisinopril and metoprolol. HR okay. Checking CMP and adjust as needed.

## 2021-08-30 NOTE — Assessment & Plan Note (Signed)
Referral to ENT for intermittent hoarseness and smoking. She is advised to quit.

## 2021-08-30 NOTE — Assessment & Plan Note (Signed)
No flare of breathing currently and is not using inhaler except prn.

## 2021-09-04 ENCOUNTER — Ambulatory Visit: Payer: Medicare Other | Admitting: Podiatry

## 2021-09-06 ENCOUNTER — Telehealth: Payer: Self-pay | Admitting: Internal Medicine

## 2021-09-06 NOTE — Telephone Encounter (Signed)
Patient calling in regarding recent lab results  Requesting call back from provider or nurse  [phone]

## 2021-09-06 NOTE — Telephone Encounter (Signed)
See result note.  

## 2021-09-09 ENCOUNTER — Telehealth: Payer: Self-pay

## 2021-09-09 NOTE — Telephone Encounter (Signed)
Informed patient of results and recommendations. 

## 2021-09-09 NOTE — Telephone Encounter (Signed)
-----   Message from Myrlene Broker, MD sent at 08/29/2021 12:18 PM EDT ----- Please call and let her know that the HIV is negative, the vitamins are normal except vitamin D is mildly low she should be taking 2000 units vitamin D otc. Other labs normal.

## 2021-09-09 NOTE — Progress Notes (Signed)
Informed patient of results and recommendations. 

## 2021-09-24 ENCOUNTER — Ambulatory Visit (INDEPENDENT_AMBULATORY_CARE_PROVIDER_SITE_OTHER): Payer: Medicare Other | Admitting: Otolaryngology

## 2021-10-03 DIAGNOSIS — Z124 Encounter for screening for malignant neoplasm of cervix: Secondary | ICD-10-CM | POA: Diagnosis not present

## 2021-10-03 DIAGNOSIS — Z01411 Encounter for gynecological examination (general) (routine) with abnormal findings: Secondary | ICD-10-CM | POA: Diagnosis not present

## 2021-10-03 DIAGNOSIS — Z1231 Encounter for screening mammogram for malignant neoplasm of breast: Secondary | ICD-10-CM | POA: Diagnosis not present

## 2021-10-03 DIAGNOSIS — Z113 Encounter for screening for infections with a predominantly sexual mode of transmission: Secondary | ICD-10-CM | POA: Diagnosis not present

## 2021-10-03 DIAGNOSIS — Z01419 Encounter for gynecological examination (general) (routine) without abnormal findings: Secondary | ICD-10-CM | POA: Diagnosis not present

## 2021-10-03 DIAGNOSIS — Z90711 Acquired absence of uterus with remaining cervical stump: Secondary | ICD-10-CM | POA: Diagnosis not present

## 2021-10-03 DIAGNOSIS — Z6833 Body mass index (BMI) 33.0-33.9, adult: Secondary | ICD-10-CM | POA: Diagnosis not present

## 2021-10-08 ENCOUNTER — Ambulatory Visit (INDEPENDENT_AMBULATORY_CARE_PROVIDER_SITE_OTHER): Payer: Medicare Other | Admitting: Otolaryngology

## 2021-11-09 IMAGING — CT CT HEAD W/O CM
3 series · 15 of 47 positions shown, 18 images · non-contrast
Comparison: None.

CLINICAL DATA: Encephalopathy

EXAM:
CT HEAD WITHOUT CONTRAST
TECHNIQUE: Contiguous axial images were obtained from the base of the skull
through the vertex without intravenous contrast.

[Series 2: head wo · axial · 0.52mm/px · z∈[-138,-3]mm · 9 of 33 slices shown, 12 images]
[im 3/33  brain]
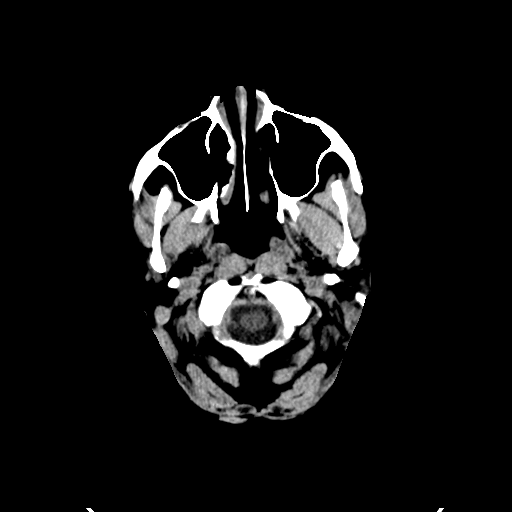
[im 3/33  bone]
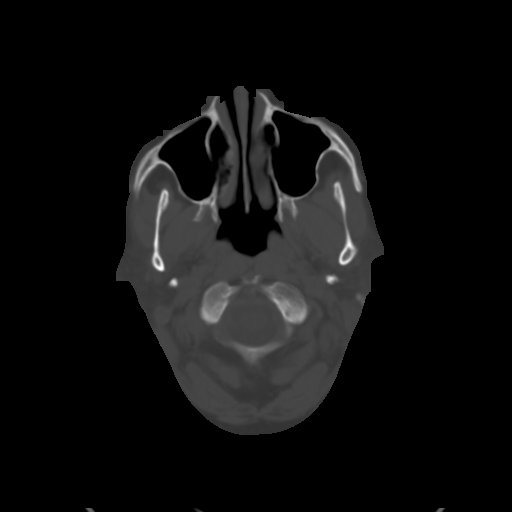
[im 6/33  brain]
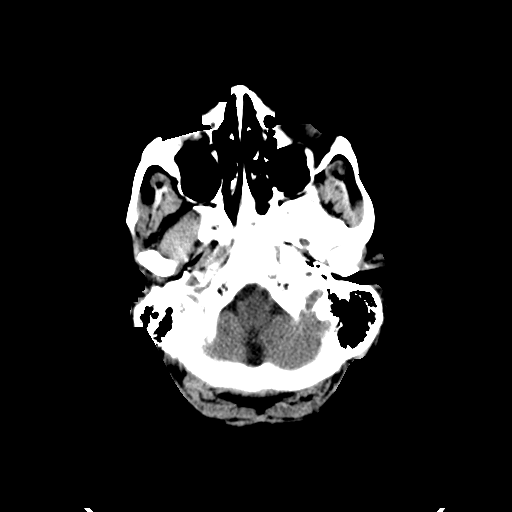
[im 9/33  brain]
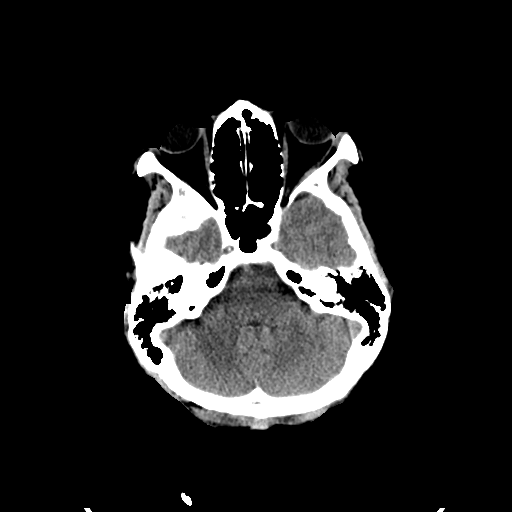
[im 13/33  brain]
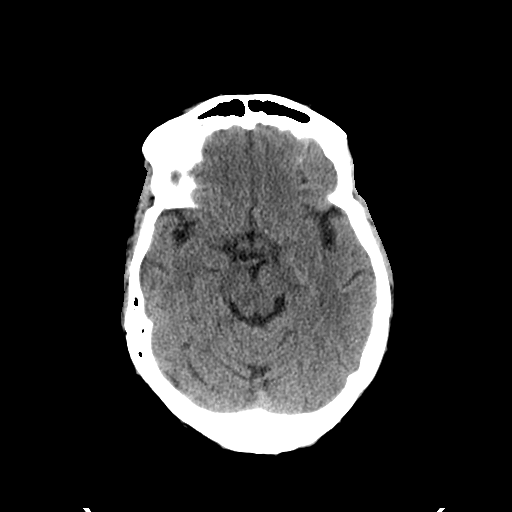
[im 17/33  brain]
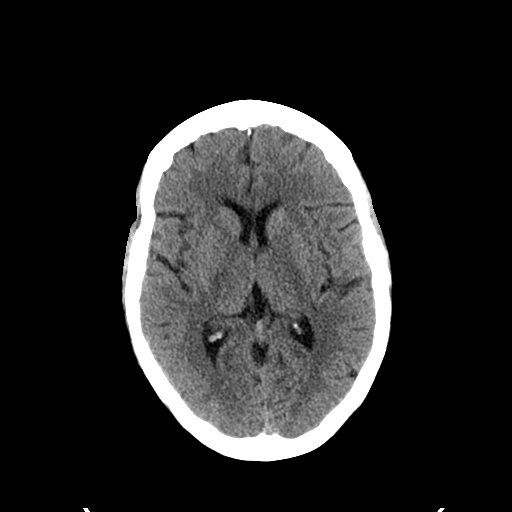
[im 17/33  bone]
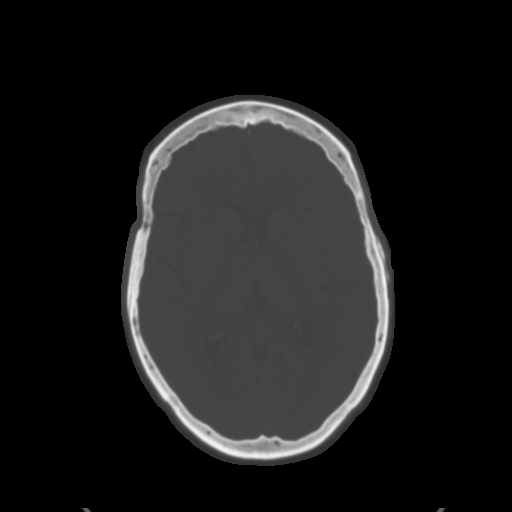
[im 20/33  brain]
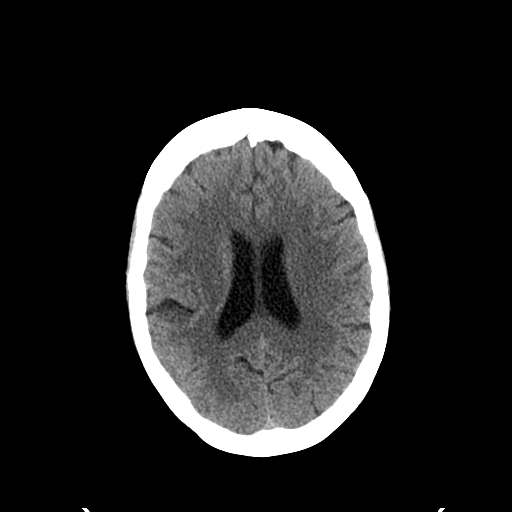
[im 24/33  brain]
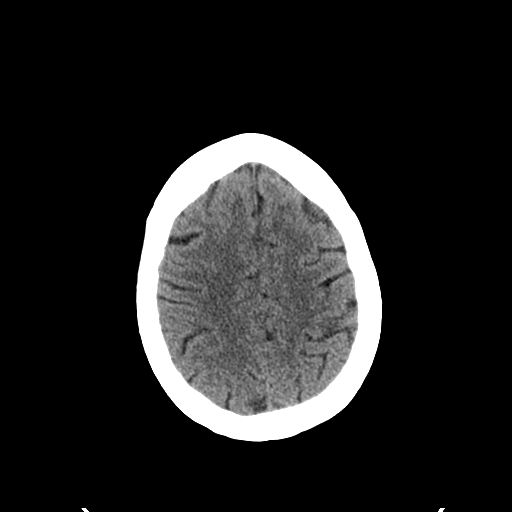
[im 27/33  brain]
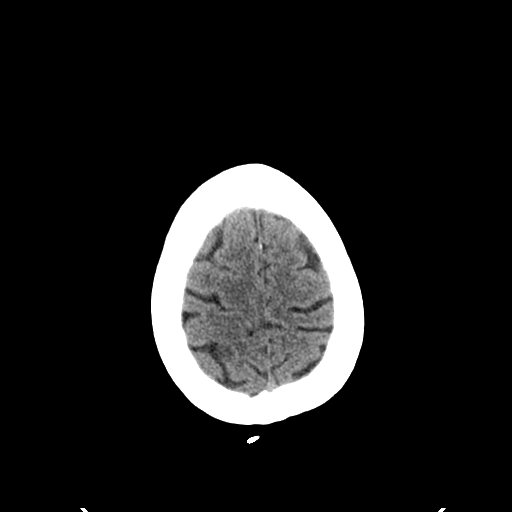
[im 30/33  brain]
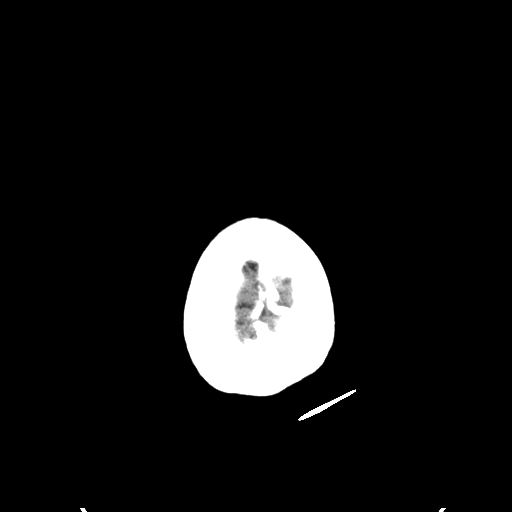
[im 30/33  bone]
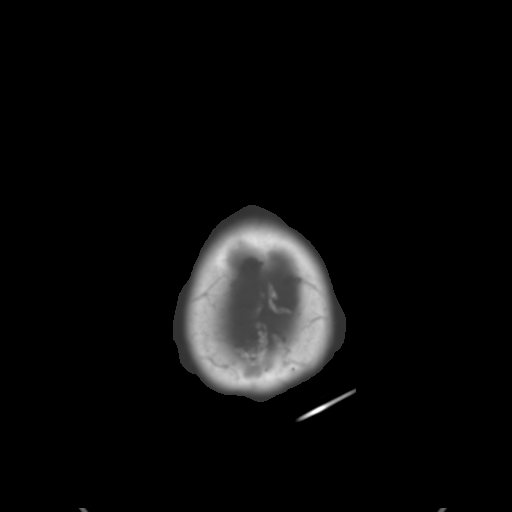

[Series 4: coronal soft tissue · coronal · 0.35mm/px · 3 of 64 slices shown]
[im 22/64  brain]
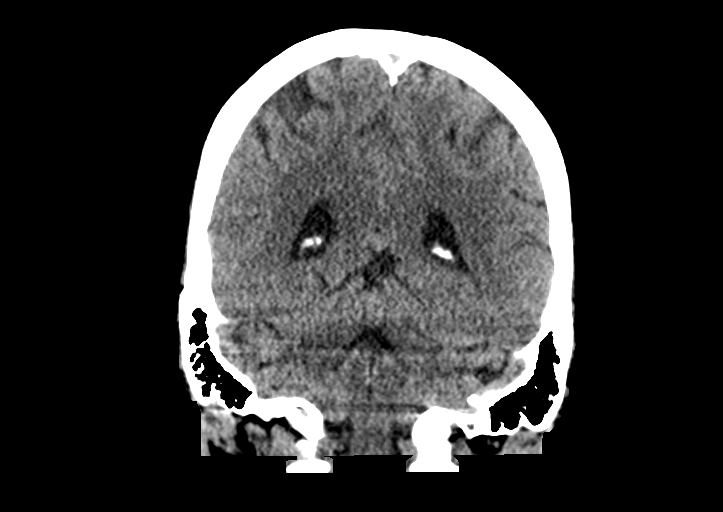
[im 29/64  brain]
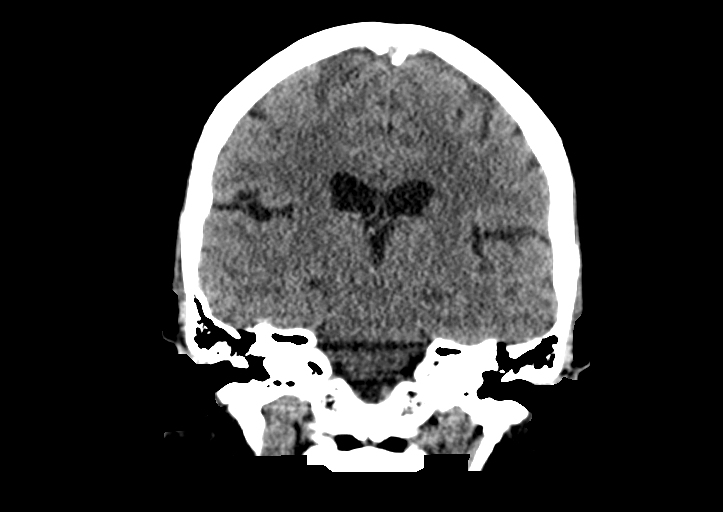
[im 36/64  brain]
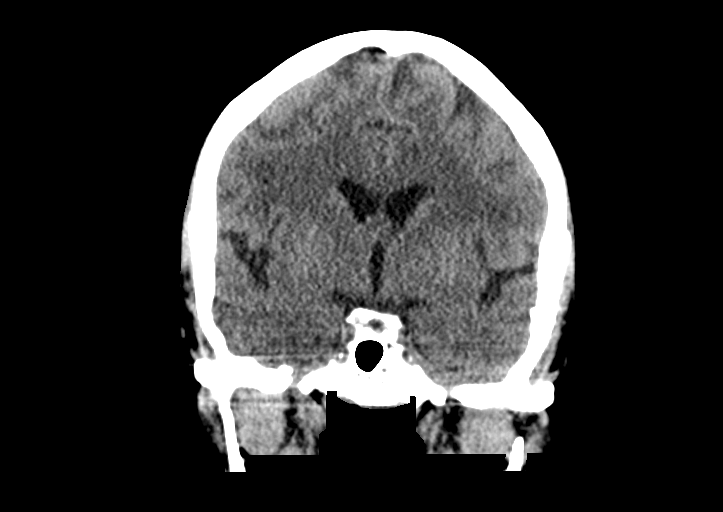

[Series 5: sagittal soft tissue · sagittal · 0.35mm/px · 3 of 49 slices shown]
[im 17/49  brain]
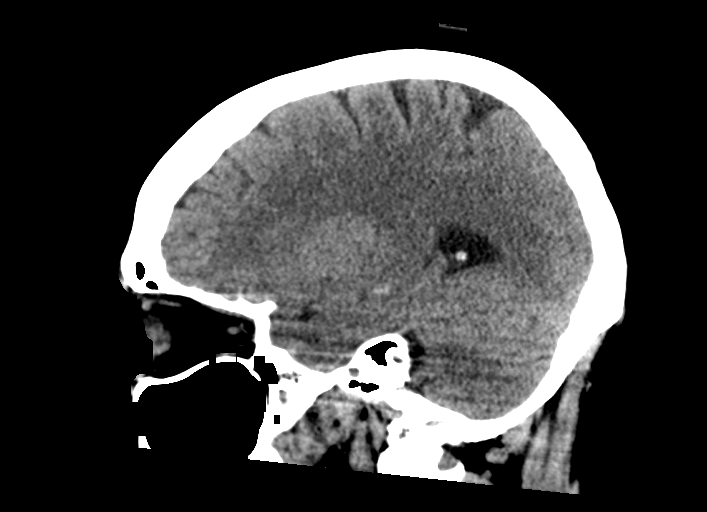
[im 25/49  brain]
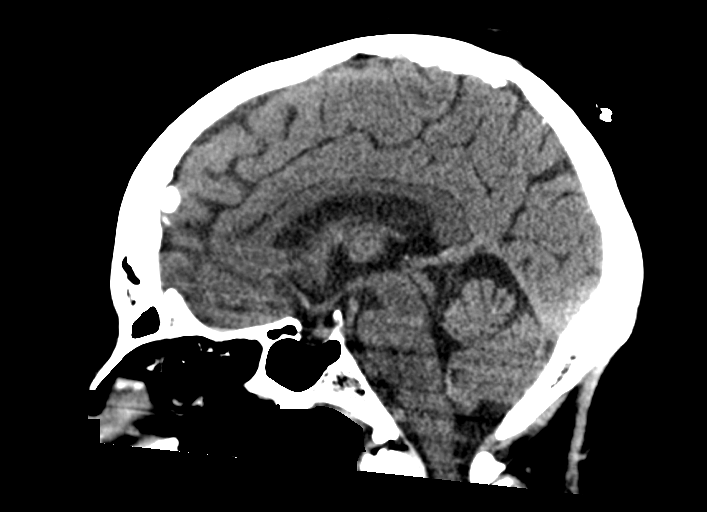
[im 33/49  brain]
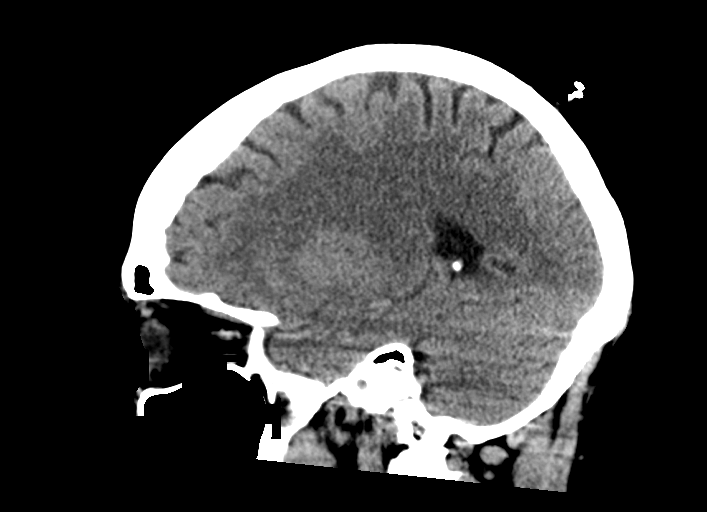

[15 of 47 positions shown; findings below may reference images not displayed]

FINDINGS: Brain: No acute intracranial abnormality. Specifically, no
hemorrhage, hydrocephalus, mass lesion, acute infarction, or
significant intracranial injury.

Vascular: No hyperdense vessel or unexpected calcification.

Skull: No acute calvarial abnormality.

Sinuses/Orbits: Visualized paranasal sinuses and mastoids clear.
Orbital soft tissues unremarkable.

Other: None
IMPRESSION: Normal study.

## 2022-01-03 ENCOUNTER — Ambulatory Visit: Payer: Medicare Other | Admitting: Internal Medicine

## 2022-01-09 ENCOUNTER — Ambulatory Visit: Payer: Medicare Other | Admitting: Internal Medicine

## 2022-01-10 ENCOUNTER — Encounter: Payer: Self-pay | Admitting: Internal Medicine

## 2022-01-10 ENCOUNTER — Ambulatory Visit (INDEPENDENT_AMBULATORY_CARE_PROVIDER_SITE_OTHER): Payer: Medicare Other | Admitting: Internal Medicine

## 2022-01-10 ENCOUNTER — Ambulatory Visit (INDEPENDENT_AMBULATORY_CARE_PROVIDER_SITE_OTHER): Payer: Medicare Other

## 2022-01-10 ENCOUNTER — Other Ambulatory Visit: Payer: Self-pay

## 2022-01-10 VITALS — BP 126/86 | HR 90 | Temp 98.7°F | Ht 66.0 in | Wt 210.0 lb

## 2022-01-10 DIAGNOSIS — R0602 Shortness of breath: Secondary | ICD-10-CM | POA: Insufficient documentation

## 2022-01-10 NOTE — Progress Notes (Signed)
° °  Subjective:   Patient ID: Jackie Odonnell, female    DOB: 07/19/1961, 61 y.o.   MRN: [id number]  Influenza Associated symptoms include coughing. Pertinent negatives include no abdominal pain, chest pain, nausea or vomiting.  The patient is a 61 YO female coming in for follow up flu. Family members had same. Cough and SOB now improving.   Review of Systems  Constitutional: Negative.   HENT: Negative.    Eyes: Negative.   Respiratory:  Positive for cough and shortness of breath. Negative for chest tightness.   Cardiovascular:  Negative for chest pain, palpitations and leg swelling.  Gastrointestinal:  Negative for abdominal distention, abdominal pain, constipation, diarrhea, nausea and vomiting.  Musculoskeletal: Negative.   Skin: Negative.   Neurological: Negative.   Psychiatric/Behavioral: Negative.     Objective:  Physical Exam Constitutional:      Appearance: She is well-developed.  HENT:     Head: Normocephalic and atraumatic.  Cardiovascular:     Rate and Rhythm: Normal rate and regular rhythm.  Pulmonary:     Effort: Pulmonary effort is normal. No respiratory distress.     Breath sounds: Wheezing present. No rales.     Comments: Minimal expiratory wheezing Abdominal:     General: Bowel sounds are normal. There is no distension.     Palpations: Abdomen is soft.     Tenderness: There is no abdominal tenderness. There is no rebound.  Musculoskeletal:     Cervical back: Normal range of motion.  Skin:    General: Skin is warm and dry.  Neurological:     Mental Status: She is alert and oriented to person, place, and time.     Coordination: Coordination normal.    Vitals:   01/10/22 1549  BP: 126/86  Pulse: 90  Temp: 98.7 F (37.1 C)  TempSrc: Oral  SpO2: 97%  Weight: 210 lb (95.3 kg)  Height: 5\' 6"  (1.676 m)    This visit occurred during the SARS-CoV-2 public health emergency.  Safety protocols were in place, including screening questions prior to the visit,  additional usage of staff PPE, and extensive cleaning of exam room while observing appropriate contact time as indicated for disinfecting solutions.   Assessment & Plan:

## 2022-01-10 NOTE — Patient Instructions (Signed)
We will check the chest x-ray today.    

## 2022-01-10 NOTE — Assessment & Plan Note (Signed)
Overall is improving. Checking CXR today, recent flu. Referral to pulmonary per patient request. Hx asthma and smoker. Albuterol prn only currently.

## 2022-01-28 DIAGNOSIS — F250 Schizoaffective disorder, bipolar type: Secondary | ICD-10-CM | POA: Diagnosis not present

## 2022-01-28 DIAGNOSIS — F419 Anxiety disorder, unspecified: Secondary | ICD-10-CM | POA: Diagnosis not present

## 2022-01-28 DIAGNOSIS — F17200 Nicotine dependence, unspecified, uncomplicated: Secondary | ICD-10-CM | POA: Diagnosis not present

## 2022-02-25 ENCOUNTER — Ambulatory Visit: Payer: Medicare Other | Admitting: Internal Medicine

## 2022-03-04 ENCOUNTER — Institutional Professional Consult (permissible substitution): Payer: Medicare Other | Admitting: Pulmonary Disease

## 2022-03-25 ENCOUNTER — Other Ambulatory Visit: Payer: Self-pay | Admitting: Family

## 2022-03-25 ENCOUNTER — Ambulatory Visit: Payer: Medicare Other | Admitting: Internal Medicine

## 2022-04-03 ENCOUNTER — Telehealth: Payer: Self-pay | Admitting: Internal Medicine

## 2022-04-03 ENCOUNTER — Other Ambulatory Visit: Payer: Self-pay | Admitting: Family

## 2022-04-03 NOTE — Telephone Encounter (Signed)
1.Medication Requested: ?lisinopril (ZESTRIL) 40 MG tablet ?2. Pharmacy (Name, [address] [place]): ?[id number] - [place], [place] - [address] AT [facility] OF [facility] & [address] Phone:  [phone]  ?Fax:  [phone]  ?  ? ?3. On Med List:y  ? ?4. Last Visit with PCP: ? ?5. Next visit date with PCP: ? ? ?Agent: Please be advised that RX refills may take up to 3 business days. We ask that you follow-up with your pharmacy.  ?

## 2022-04-04 MED ORDER — LISINOPRIL 40 MG PO TABS: ORAL_TABLET | ORAL | 0 refills | Status: DC

## 2022-04-04 NOTE — Telephone Encounter (Signed)
Refill has been sent to the pt's pharmacy  

## 2022-04-09 ENCOUNTER — Other Ambulatory Visit: Payer: Self-pay | Admitting: Family

## 2022-04-09 ENCOUNTER — Institutional Professional Consult (permissible substitution): Payer: Medicare Other | Admitting: Pulmonary Disease

## 2022-04-09 NOTE — Progress Notes (Deleted)
Synopsis: Referred in April 2023 for shortness of breath by Pricilla Holm  Subjective:   PATIENT ID: Jackie Odonnell GENDER: female DOB: Oct 18, 1961, MRN: [id number]   HPI  No chief complaint on file.  Jackie Odonnell is a 60 year old woman, daily smoker with hypertension and bipolar affective disorder who is referred to pulmonary clinic for shortness of breath.   Primary care note from 01/10/22 reviewed. She had flu which led to increase in dyspnea. Chest radiograph shows mild vascular congestion.   Past Medical History:  Diagnosis Date   Anxiety    Bipolar affective ([place])    Chemical Imbalance   Depression    Hypertension      Family History  Problem Relation Age of Onset   Colon cancer Neg Hx    Esophageal cancer Neg Hx    Rectal cancer Neg Hx    Stomach cancer Neg Hx      Social History   Socioeconomic History   Marital status: Single    Spouse name: Not on file   Number of children: 0   Years of education: 15   Highest education level: Not on file  Occupational History   Occupation: Disability    Comment: Schizophrenia  Tobacco Use   Smoking status: Every Day    Packs/day: 2.00    Years: 15.00    Pack years: 30.00    Types: Cigarettes   Smokeless tobacco: Never   Tobacco comments:    Trying to cut back  Vaping Use   Vaping Use: Never used  Substance and Sexual Activity   Alcohol use: No    Alcohol/week: 0.0 standard drinks   Drug use: No   Sexual activity: Not Currently  Other Topics Concern   Not on file  Social History Narrative   Born and raised in [place], [place]. Currently resides in an apartment by herself. No pets. Fun: Watching tv and walking. Denies religious/spiritual beliefs effecting health care.    Social Determinants of Health   Financial Resource Strain: Low Risk    Difficulty of Paying Living Expenses: Not hard at all  Food Insecurity: No Food Insecurity   Worried About Charity fundraiser in the Last Year: Never true   [place] in the Last Year: Never true  Transportation Needs: No Transportation Needs   Lack of Transportation (Medical): No   Lack of Transportation (Non-Medical): No  Physical Activity: Sufficiently Active   Days of Exercise per Week: 5 days   Minutes of Exercise per Session: 30 min  Stress: No Stress Concern Present   Feeling of Stress : Not at all  Social Connections: Socially Isolated   Frequency of Communication with Friends and Family: More than three times a week   Frequency of Social Gatherings with Friends and Family: Once a week   Attends Religious Services: Never   Marine scientist or Organizations: No   Attends Music therapist: Never   Marital Status: Never married  Human resources officer Violence: Not on file     No Known Allergies   Outpatient Medications Prior to Visit  Medication Sig Dispense Refill   amLODipine (NORVASC) 10 MG tablet Take 1 tablet (10 mg total) by mouth daily. 90 tablet 3   ARIPiprazole (ABILIFY) 20 MG tablet      benztropine (COGENTIN) 0.5 MG tablet Take 0.5 mg by mouth 2 (two) times daily.     busPIRone (BUSPAR) 10 MG tablet Take 10 mg by mouth  3 (three) times daily.   2   hydrOXYzine (ATARAX/VISTARIL) 25 MG tablet      lamoTRIgine (LAMICTAL) 100 MG tablet Take 100 mg by mouth 2 (two) times daily.     lisinopril (ZESTRIL) 40 MG tablet TAKE 1 TABLET(40 MG) BY MOUTH DAILY 90 tablet 0   metoprolol tartrate (LOPRESSOR) 100 MG tablet Take 1 tablet (100 mg total) by mouth 2 (two) times daily. 180 tablet 3   Multiple Vitamins-Iron (MULTIVITAMINS WITH IRON) TABS tablet Take 1 tablet by mouth daily.     Potassium Chloride ER 20 MEQ TBCR TAKE 1 TABLET BY MOUTH DAILY 90 tablet 3   No facility-administered medications prior to visit.   Review of Systems  Constitutional:  Negative for chills, fever, malaise/fatigue and weight loss.  HENT:  Negative for congestion, sinus pain and sore throat.   Eyes: Negative.   Respiratory:  Negative for  cough, hemoptysis, sputum production, shortness of breath and wheezing.   Cardiovascular:  Negative for chest pain, palpitations, orthopnea, claudication and leg swelling.  Gastrointestinal:  Negative for abdominal pain, heartburn, nausea and vomiting.  Genitourinary: Negative.   Musculoskeletal:  Negative for joint pain and myalgias.  Skin:  Negative for rash.  Neurological:  Negative for weakness.  Endo/Heme/Allergies: Negative.   Psychiatric/Behavioral: Negative.     Objective:  There were no vitals filed for this visit.  Physical Exam Constitutional:      General: She is not in acute distress.    Appearance: She is not ill-appearing.  HENT:     Head: Normocephalic and atraumatic.  Eyes:     General: No scleral icterus.    Conjunctiva/sclera: Conjunctivae normal.     Pupils: Pupils are equal, round, and reactive to light.  Cardiovascular:     Rate and Rhythm: Normal rate and regular rhythm.     Pulses: Normal pulses.     Heart sounds: Normal heart sounds. No murmur heard. Pulmonary:     Effort: Pulmonary effort is normal.     Breath sounds: Normal breath sounds. No wheezing, rhonchi or rales.  Abdominal:     General: Bowel sounds are normal.     Palpations: Abdomen is soft.  Musculoskeletal:     Right lower leg: No edema.     Left lower leg: No edema.  Lymphadenopathy:     Cervical: No cervical adenopathy.  Skin:    General: Skin is warm and dry.  Neurological:     General: No focal deficit present.     Mental Status: She is alert.  Psychiatric:        Mood and Affect: Mood normal.        Behavior: Behavior normal.        Thought Content: Thought content normal.        Judgment: Judgment normal.   CBC    Component Value Date/Time   WBC 8.7 01/28/2021 1518   RBC 5.84 (H) 01/28/2021 1518   HGB 13.3 01/28/2021 1518   HCT 42.0 01/28/2021 1518   PLT 290.0 01/28/2021 1518   MCV 71.9 (L) 01/28/2021 1518   MCH 23.0 (L) 01/05/2020 0454   MCHC 31.8 01/28/2021 1518    RDW 17.6 (H) 01/28/2021 1518   LYMPHSABS 3.1 01/28/2021 1518   MONOABS 0.7 01/28/2021 1518   EOSABS 0.1 01/28/2021 1518   BASOSABS 0.1 01/28/2021 1518      Latest Ref Rng & Units 01/28/2021    3:18 PM 11/26/2020    3:44 PM 08/31/2020    4:23  PM  BMP  Glucose 70 - 99 mg/dL 92   86   103    BUN 6 - 23 mg/dL 8   7   6     Creatinine 0.40 - 1.20 mg/dL 0.81   0.83   0.84    BUN/Creat Ratio 6 - 22 (calc)   7    Sodium 135 - 145 mEq/L 138   143   143    Potassium 3.5 - 5.1 mEq/L 4.4   3.3   3.3    Chloride 96 - 112 mEq/L 104   104   105    CO2 19 - 32 mEq/L 29   32   29    Calcium 8.4 - 10.5 mg/dL 10.1   9.3   9.4     Chest imaging: CXR 01/10/22 Mild vascular congestion. No focal consolidation, pleural effusion, or pneumothorax. The cardiac silhouette is within normal limits. No acute osseous pathology.  PFT:     View : No data to display.          Labs:  Path:  Echo:  Heart Catheterization:  Assessment & Plan:   No diagnosis found.  Discussion: ***    Current Outpatient Medications:    amLODipine (NORVASC) 10 MG tablet, Take 1 tablet (10 mg total) by mouth daily., Disp: 90 tablet, Rfl: 3   ARIPiprazole (ABILIFY) 20 MG tablet, , Disp: , Rfl:    benztropine (COGENTIN) 0.5 MG tablet, Take 0.5 mg by mouth 2 (two) times daily., Disp: , Rfl:    busPIRone (BUSPAR) 10 MG tablet, Take 10 mg by mouth 3 (three) times daily. , Disp: , Rfl: 2   hydrOXYzine (ATARAX/VISTARIL) 25 MG tablet, , Disp: , Rfl:    lamoTRIgine (LAMICTAL) 100 MG tablet, Take 100 mg by mouth 2 (two) times daily., Disp: , Rfl:    lisinopril (ZESTRIL) 40 MG tablet, TAKE 1 TABLET(40 MG) BY MOUTH DAILY, Disp: 90 tablet, Rfl: 0   metoprolol tartrate (LOPRESSOR) 100 MG tablet, Take 1 tablet (100 mg total) by mouth 2 (two) times daily., Disp: 180 tablet, Rfl: 3   Multiple Vitamins-Iron (MULTIVITAMINS WITH IRON) TABS tablet, Take 1 tablet by mouth daily., Disp: , Rfl:    Potassium Chloride ER 20 MEQ TBCR, TAKE 1  TABLET BY MOUTH DAILY, Disp: 90 tablet, Rfl: 3

## 2022-04-21 ENCOUNTER — Ambulatory Visit (INDEPENDENT_AMBULATORY_CARE_PROVIDER_SITE_OTHER): Payer: Medicare Other

## 2022-04-21 ENCOUNTER — Ambulatory Visit: Payer: Medicare Other

## 2022-04-21 VITALS — Ht 66.0 in | Wt 210.0 lb

## 2022-04-21 DIAGNOSIS — Z0000 Encounter for general adult medical examination without abnormal findings: Secondary | ICD-10-CM | POA: Diagnosis not present

## 2022-04-21 NOTE — Progress Notes (Signed)
? ?Subjective:  ? Jackie Odonnell is a 61 y.o. female who presents for Medicare Annual (Subsequent) preventive examination. ? ?Review of Systems    ?Virtual Visit via Telephone Note ? ?I connected with  Jackie Odonnell on 04/21/22 at 11:15 AM EDT by telephone and verified that I am speaking with the correct person using two identifiers. ? ?Location: ?Patient: Home ?Provider: Office ?Persons participating in the virtual visit: patient/Nurse Health Advisor ?  ?I discussed the limitations, risks, security and privacy concerns of performing an evaluation and management service by telephone and the availability of in person appointments. The patient expressed understanding and agreed to proceed. ? ?Interactive audio and video telecommunications were attempted between this nurse and patient, however failed, due to patient having technical difficulties OR patient did not have access to video capability.  We continued and completed visit with audio only. ? ?Some vital signs may be absent or patient reported.  ? ?Jackie RungBeverly W Marelyn Rouser, LPN  ?Cardiac Risk Factors include: advanced age (>7655men, 19>65 women);hypertension;smoking/ tobacco exposure ? ?   ?Objective:  ?  ?Today's Vitals  ? 04/21/22 1151  ?Weight: 210 lb (95.3 kg)  ?Height: 5\' 6"  (1.676 m)  ? ?Body mass index is 33.89 kg/m?. ? ? ?  04/21/2022  ? 12:04 PM 04/12/2021  ? 12:36 PM 01/02/2020  ?  2:47 PM 11/25/2018  ? 12:52 PM 06/16/2017  ?  1:20 PM 08/07/2012  ?  3:00 PM 08/06/2012  ?  1:00 AM  ?Advanced Directives  ?Does Patient Have a Medical Advance Directive? No No No No No  Patient does not have advance directive  ?Would patient like information on creating a medical advance directive? No - Patient declined No - Patient declined Yes (ED - Information included in AVS) No - Patient declined Yes (ED - Information included in AVS)    ?Pre-existing out of facility DNR order (yellow form or pink MOST form)       No  ?  ? Information is confidential and restricted. Go to Review  Flowsheets to unlock data.  ? ? ?Current Medications (verified) ?Outpatient Encounter Medications as of 04/21/2022  ?Medication Sig  ? amLODipine (NORVASC) 10 MG tablet TAKE 1 TABLET(10 MG) BY MOUTH DAILY  ? ARIPiprazole (ABILIFY) 20 MG tablet   ? benztropine (COGENTIN) 0.5 MG tablet Take 0.5 mg by mouth 2 (two) times daily.  ? busPIRone (BUSPAR) 10 MG tablet Take 10 mg by mouth 3 (three) times daily.   ? hydrOXYzine (ATARAX/VISTARIL) 25 MG tablet   ? lamoTRIgine (LAMICTAL) 100 MG tablet Take 100 mg by mouth 2 (two) times daily.  ? lisinopril (ZESTRIL) 40 MG tablet TAKE 1 TABLET(40 MG) BY MOUTH DAILY  ? metoprolol tartrate (LOPRESSOR) 100 MG tablet Take 1 tablet (100 mg total) by mouth 2 (two) times daily.  ? Multiple Vitamins-Iron (MULTIVITAMINS WITH IRON) TABS tablet Take 1 tablet by mouth daily.  ? Potassium Chloride ER 20 MEQ TBCR TAKE 1 TABLET BY MOUTH DAILY  ? ?No facility-administered encounter medications on file as of 04/21/2022.  ? ? ?Allergies (verified) ?Patient has no known allergies.  ? ?History: ?Past Medical History:  ?Diagnosis Date  ? Anxiety   ? Bipolar affective ([facility])   ? Chemical Imbalance  ? Depression   ? Hypertension   ? ?Past Surgical History:  ?Procedure Laterality Date  ? ABDOMINAL HYSTERECTOMY    ? ECTOPIC PREGNANCY SURGERY    ? intestines removed    ? scar tissue issue  ? ?Family History  ?  Problem Relation Age of Onset  ? Colon cancer Neg Hx   ? Esophageal cancer Neg Hx   ? Rectal cancer Neg Hx   ? Stomach cancer Neg Hx   ? ?Social History  ? ?Socioeconomic History  ? Marital status: Single  ?  Spouse name: Not on file  ? Number of children: 0  ? Years of education: 35  ? Highest education level: Not on file  ?Occupational History  ? Occupation: Disability  ?  Comment: Schizophrenia  ?Tobacco Use  ? Smoking status: Every Day  ?  Packs/day: 2.00  ?  Years: 15.00  ?  Pack years: 30.00  ?  Types: Cigarettes  ? Smokeless tobacco: Never  ? Tobacco comments:  ?  Trying to cut back  ?Vaping Use  ?  Vaping Use: Never used  ?Substance and Sexual Activity  ? Alcohol use: No  ?  Alcohol/week: 0.0 standard drinks  ? Drug use: No  ? Sexual activity: Not Currently  ?Other Topics Concern  ? Not on file  ?Social History Narrative  ? Born and raised in [place], [place]. Currently resides in an apartment by herself. No pets. Fun: Watching tv and walking. Denies religious/spiritual beliefs effecting health care.   ? ?Social Determinants of Health  ? ?Financial Resource Strain: Low Risk   ? Difficulty of Paying Living Expenses: Not hard at all  ?Food Insecurity: No Food Insecurity  ? Worried About Programme researcher, broadcasting/film/video in the Last Year: Never true  ? Ran Out of Food in the Last Year: Never true  ?Transportation Needs: No Transportation Needs  ? Lack of Transportation (Medical): No  ? Lack of Transportation (Non-Medical): No  ?Physical Activity: Sufficiently Active  ? Days of Exercise per Week: 5 days  ? Minutes of Exercise per Session: 30 min  ?Stress: No Stress Concern Present  ? Feeling of Stress : Not at all  ?Social Connections: Unknown  ? Frequency of Communication with Friends and Family: More than three times a week  ? Frequency of Social Gatherings with Friends and Family: More than three times a week  ? Attends Religious Services: Not on file  ? Active Member of Clubs or Organizations: Not on file  ? Attends Banker Meetings: Not on file  ? Marital Status: Not on file  ? ? ?Tobacco Counseling ?Ready to quit: Yes ?Counseling given: Yes ?Tobacco comments: Trying to cut back ? ? ?Clinical Intake: ? ?Pre-visit preparation completed: No ?Diabetic?  No ? ? ?Activities of Daily Living ? ?  04/21/2022  ? 12:02 PM  ?In your present state of health, do you have any difficulty performing the following activities:  ?Hearing? 0  ?Vision? 0  ?Difficulty concentrating or making decisions? 0  ?Walking or climbing stairs? 0  ?Dressing or bathing? 0  ?Doing errands, shopping? 0  ?Preparing Food and eating ? N  ?Using the  Toilet? N  ?In the past six months, have you accidently leaked urine? N  ?Do you have problems with loss of bowel control? N  ?Managing your Medications? N  ?Managing your Finances? N  ?Housekeeping or managing your Housekeeping? N  ? ? ?Patient Care Team: ?Myrlene Broker, MD as PCP - General (Internal Medicine) ? ?Indicate any recent Medical Services you may have received from other than [facility] providers in the past year (date may be approximate). ? ?   ?Assessment:  ? This is a routine wellness examination for [facility]. ? ?Hearing/Vision screen ?Hearing Screening -  Comments:: No hearing difficulty ?Vision Screening - Comments:: Wears reading glasses. Dr Nile Riggs ? ?Dietary issues and exercise activities discussed: ?Exercise limited by: None identified ? ? Goals Addressed   ? ?  ?  ?  ?  ?  ? This Visit's Progress  ?   Patient Stated (pt-stated)     ?   I will drink more water and gear up to prepare myself to quit smoking. ? ?  ? ?  ? ?Depression Screen ? ?  04/21/2022  ? 11:58 AM 04/12/2021  ?  1:00 PM 04/12/2021  ? 12:41 PM 01/28/2021  ?  2:36 PM 11/25/2018  ?  1:18 PM 06/16/2017  ?  1:26 PM  ?PHQ 2/9 Scores  ?PHQ - 2 Score 0 0 0 0 2 2  ?PHQ- 9 Score     3 5  ?  ?Fall Risk ? ?  04/21/2022  ? 12:03 PM 04/12/2021  ? 12:36 PM 11/25/2018  ?  1:18 PM 06/16/2017  ?  1:26 PM  ?Fall Risk   ?Falls in the past year? 0 1 0 No  ?Number falls in past yr: 0 0    ?Injury with Fall? 0 0    ?Risk for fall due to : No Fall Risks     ?Follow up  Falls evaluation completed    ? ? ?FALL RISK PREVENTION PERTAINING TO THE HOME: ? ?Any stairs in or around the home? Yes  ?If so, are there any without handrails? No  ?Home free of loose throw rugs in walkways, pet beds, electrical cords, etc? Yes  ?Adequate lighting in your home to reduce risk of falls? Yes  ? ?ASSISTIVE DEVICES UTILIZED TO PREVENT FALLS: ? ?Life alert? No  ?Use of a cane, walker or w/c? No  ?Grab bars in the bathroom? No  ?Shower chair or bench in shower? No  ?Elevated toilet seat or  a handicapped toilet? No  ? ?TIMED UP AND GO: ? ?Was the test performed? No . Audio Visit ? ?Cognitive Function: ?  ? ?  04/21/2022  ? 12:04 PM  ?6CIT Screen  ?What Year? 0 points  ?What month? 0 points  ?Wha

## 2022-04-21 NOTE — Patient Instructions (Addendum)
?Jackie Odonnell , ?Thank you for taking time to come for your Medicare Wellness Visit. I appreciate your ongoing commitment to your health goals. Please review the following plan we discussed and let me know if I can assist you in the future.  ? ?These are the goals we discussed: ? Goals   ? ?   <enter goal here>   ?   [place] to walk 3 times a week and  save my money for a car   ?   Patient Stated (pt-stated)   ?   I will drink more water and gear up to prepare myself to quit smoking. ? ?  ? ?  ?  ?This is a list of the screening recommended for you and due dates:  ?Health Maintenance  ?Topic Date Due  ? Pap Smear  09/21/2020  ? COVID-19 Vaccine (3 - Booster for Pfizer series) 05/07/2022*  ? Zoster (Shingles) Vaccine (1 of 2) 07/22/2022*  ? Tetanus Vaccine  04/22/2023*  ? Flu Shot  07/22/2022  ? Mammogram  01/28/2023  ? Cologuard (Stool DNA test)  03/04/2024  ? Hepatitis C Screening: USPSTF Recommendation to screen - Ages 58-79 yo.  Completed  ? HIV Screening  Completed  ? HPV Vaccine  Aged Out  ?*Topic was postponed. The date shown is not the original due date.  ?  ?Advanced directives: No  ? ?Conditions/risks identified: None ? ?Next appointment: Follow up in one year for your annual wellness visit.  ? ?Preventive Care 40-64 Years, Female ?Preventive care refers to lifestyle choices and visits with your health care provider that can promote health and wellness. ?What does preventive care include? ?A yearly physical exam. This is also called an annual well check. ?Dental exams once or twice a year. ?Routine eye exams. Ask your health care provider how often you should have your eyes checked. ?Personal lifestyle choices, including: ?Daily care of your teeth and gums. ?Regular physical activity. ?Eating a healthy diet. ?Avoiding tobacco and drug use. ?Limiting alcohol use. ?Practicing safe sex. ?Taking low-dose aspirin daily starting at age 32. ?Taking vitamin and mineral supplements as recommended by your health  care provider. ?What happens during an annual well check? ?The services and screenings done by your health care provider during your annual well check will depend on your age, overall health, lifestyle risk factors, and family history of disease. ?Counseling  ?Your health care provider may ask you questions about your: ?Alcohol use. ?Tobacco use. ?Drug use. ?Emotional well-being. ?Home and relationship well-being. ?Sexual activity. ?Eating habits. ?Work and work Statistician. ?Method of birth control. ?Menstrual cycle. ?Pregnancy history. ?Screening  ?You may have the following tests or measurements: ?Height, weight, and BMI. ?Blood pressure. ?Lipid and cholesterol levels. These may be checked every 5 years, or more frequently if you are over 22 years old. ?Skin check. ?Lung cancer screening. You may have this screening every year starting at age 29 if you have a 30-pack-year history of smoking and currently smoke or have quit within the past 15 years. ?Fecal occult blood test (FOBT) of the stool. You may have this test every year starting at age 73. ?Flexible sigmoidoscopy or colonoscopy. You may have a sigmoidoscopy every 5 years or a colonoscopy every 10 years starting at age 51. ?Hepatitis C blood test. ?Hepatitis B blood test. ?Sexually transmitted disease (STD) testing. ?Diabetes screening. This is done by checking your blood sugar (glucose) after you have not eaten for a while (fasting). You may have this done every 1-3 years. ?  Mammogram. This may be done every 1-2 years. Talk to your health care provider about when you should start having regular mammograms. This may depend on whether you have a family history of breast cancer. ?BRCA-related cancer screening. This may be done if you have a family history of breast, ovarian, tubal, or peritoneal cancers. ?Pelvic exam and Pap test. This may be done every 3 years starting at age 63. Starting at age 61, this may be done every 5 years if you have a Pap test in  combination with an HPV test. ?Bone density scan. This is done to screen for osteoporosis. You may have this scan if you are at high risk for osteoporosis. ?Discuss your test results, treatment options, and if necessary, the need for more tests with your health care provider. ?Vaccines  ?Your health care provider may recommend certain vaccines, such as: ?Influenza vaccine. This is recommended every year. ?Tetanus, diphtheria, and acellular pertussis (Tdap, Td) vaccine. You may need a Td booster every 10 years. ?Zoster vaccine. You may need this after age 63. ?Pneumococcal 13-valent conjugate (PCV13) vaccine. You may need this if you have certain conditions and were not previously vaccinated. ?Pneumococcal polysaccharide (PPSV23) vaccine. You may need one or two doses if you smoke cigarettes or if you have certain conditions. ?Talk to your health care provider about which screenings and vaccines you need and how often you need them. ?This information is not intended to replace advice given to you by your health care provider. Make sure you discuss any questions you have with your health care provider. ?Document Released: 01/04/2016 Document Revised: 08/27/2016 Document Reviewed: 10/09/2015 ?Elsevier Interactive Patient Education ? 2017 Elsevier Inc[employer] ? ? ? ?Fall Prevention in the Home ?Falls can cause injuries. They can happen to people of all ages. There are many things you can do to make your home safe and to help prevent falls. ?What can I do on the outside of my home? ?Regularly fix the edges of walkways and driveways and fix any cracks. ?Remove anything that might make you trip as you walk through a door, such as a raised step or threshold. ?Trim any bushes or trees on the path to your home. ?Use bright outdoor lighting. ?Clear any walking paths of anything that might make someone trip, such as rocks or tools. ?Regularly check to see if handrails are loose or broken. Make sure that both sides of any steps have  handrails. ?Any raised decks and porches should have guardrails on the edges. ?Have any leaves, snow, or ice cleared regularly. ?Use sand or salt on walking paths during winter. ?Clean up any spills in your garage right away. This includes oil or grease spills. ?What can I do in the bathroom? ?Use night lights. ?Install grab bars by the toilet and in the tub and shower. Do not use towel bars as grab bars. ?Use non-skid mats or decals in the tub or shower. ?If you need to sit down in the shower, use a plastic, non-slip stool. ?Keep the floor dry. Clean up any water that spills on the floor as soon as it happens. ?Remove soap buildup in the tub or shower regularly. ?Attach bath mats securely with double-sided non-slip rug tape. ?Do not have throw rugs and other things on the floor that can make you trip. ?What can I do in the bedroom? ?Use night lights. ?Make sure that you have a light by your bed that is easy to reach. ?Do not use any sheets or blankets that  are too big for your bed. They should not hang down onto the floor. ?Have a firm chair that has side arms. You can use this for support while you get dressed. ?Do not have throw rugs and other things on the floor that can make you trip. ?What can I do in the kitchen? ?Clean up any spills right away. ?Avoid walking on wet floors. ?Keep items that you use a lot in easy-to-reach places. ?If you need to reach something above you, use a strong step stool that has a grab bar. ?Keep electrical cords out of the way. ?Do not use floor polish or wax that makes floors slippery. If you must use wax, use non-skid floor wax. ?Do not have throw rugs and other things on the floor that can make you trip. ?What can I do with my stairs? ?Do not leave any items on the stairs. ?Make sure that there are handrails on both sides of the stairs and use them. Fix handrails that are broken or loose. Make sure that handrails are as long as the stairways. ?Check any carpeting to make sure  that it is firmly attached to the stairs. Fix any carpet that is loose or worn. ?Avoid having throw rugs at the top or bottom of the stairs. If you do have throw rugs, attach them to the floor with carpet tape.

## 2022-05-02 ENCOUNTER — Ambulatory Visit: Payer: Medicare Other | Admitting: Internal Medicine

## 2022-05-06 ENCOUNTER — Ambulatory Visit: Payer: Medicare Other | Admitting: Internal Medicine

## 2022-05-28 ENCOUNTER — Other Ambulatory Visit: Payer: Self-pay | Admitting: Family

## 2022-05-28 DIAGNOSIS — I10 Essential (primary) hypertension: Secondary | ICD-10-CM

## 2022-05-30 ENCOUNTER — Encounter: Payer: Self-pay | Admitting: Internal Medicine

## 2022-05-30 ENCOUNTER — Other Ambulatory Visit: Payer: Self-pay | Admitting: Family

## 2022-05-30 ENCOUNTER — Ambulatory Visit (INDEPENDENT_AMBULATORY_CARE_PROVIDER_SITE_OTHER): Payer: Medicare Other | Admitting: Internal Medicine

## 2022-05-30 VITALS — BP 130/78 | HR 104 | Resp 18 | Ht 66.0 in | Wt 207.4 lb

## 2022-05-30 DIAGNOSIS — R7303 Prediabetes: Secondary | ICD-10-CM

## 2022-05-30 DIAGNOSIS — J410 Simple chronic bronchitis: Secondary | ICD-10-CM

## 2022-05-30 DIAGNOSIS — I10 Essential (primary) hypertension: Secondary | ICD-10-CM

## 2022-05-30 LAB — COMPREHENSIVE METABOLIC PANEL WITH GFR
ALT: 23 U/L (ref 0–35)
AST: 15 U/L (ref 0–37)
Albumin: 4.3 g/dL (ref 3.5–5.2)
Alkaline Phosphatase: 122 U/L — ABNORMAL HIGH (ref 39–117)
BUN: 8 mg/dL (ref 6–23)
CO2: 23 meq/L (ref 19–32)
Calcium: 10 mg/dL (ref 8.4–10.5)
Chloride: 102 meq/L (ref 96–112)
Creatinine, Ser: 0.93 mg/dL (ref 0.40–1.20)
GFR: 66.69 mL/min (ref >60.00)
Glucose, Bld: 107 mg/dL — ABNORMAL HIGH (ref 70–99)
Potassium: 4.2 meq/L (ref 3.5–5.1)
Sodium: 136 meq/L (ref 135–145)
Total Bilirubin: 0.3 mg/dL (ref 0.2–1.2)
Total Protein: 7.1 g/dL (ref 6.0–8.3)

## 2022-05-30 LAB — LIPID PANEL
Cholesterol: 136 mg/dL (ref 0–200)
HDL: 72.5 mg/dL (ref >39.00)
LDL Cholesterol: 40 mg/dL (ref 0–99)
NonHDL: 63.89
Total CHOL/HDL Ratio: 2
Triglycerides: 117 mg/dL (ref 0.0–149.0)
VLDL: 23.4 mg/dL (ref 0.0–40.0)

## 2022-05-30 LAB — CBC
HCT: 40.9 % (ref 36.0–46.0)
Hemoglobin: 12.6 g/dL (ref 12.0–15.0)
MCHC: 30.9 g/dL (ref 30.0–36.0)
MCV: 72.9 fl — ABNORMAL LOW (ref 78.0–100.0)
Platelets: 330 K/uL (ref 150.0–400.0)
RBC: 5.61 Mil/uL — ABNORMAL HIGH (ref 3.87–5.11)
RDW: 15.2 % (ref 11.5–15.5)
WBC: 10.7 K/uL — ABNORMAL HIGH (ref 4.0–10.5)

## 2022-05-30 LAB — HEMOGLOBIN A1C: Hgb A1c MFr Bld: 6.1 % (ref 4.6–6.5)

## 2022-05-30 NOTE — Assessment & Plan Note (Signed)
BP at goal on lisinopril 40 mg daily and amlodipine 10 mg daily and metoprolol 100 mg BID. Checking CMP, CBC, lipid panel today. Adjust as needed.

## 2022-05-30 NOTE — Progress Notes (Signed)
   Subjective:   Patient ID: Jackie Odonnell, female    DOB: [birth year], 61 y.o.   MRN: [id number]  HPI The patient is a 61 YO female coming in for follow up.  Review of Systems  Constitutional: Negative.   HENT: Negative.    Eyes: Negative.   Respiratory:  Negative for cough, chest tightness and shortness of breath.   Cardiovascular:  Negative for chest pain, palpitations and leg swelling.  Gastrointestinal:  Negative for abdominal distention, abdominal pain, constipation, diarrhea, nausea and vomiting.  Musculoskeletal: Negative.   Skin: Negative.   Neurological: Negative.   Psychiatric/Behavioral: Negative.      Objective:  Physical Exam Constitutional:      Appearance: She is well-developed.  HENT:     Head: Normocephalic and atraumatic.  Cardiovascular:     Rate and Rhythm: Normal rate and regular rhythm.  Pulmonary:     Effort: Pulmonary effort is normal. No respiratory distress.     Breath sounds: Normal breath sounds. No wheezing or rales.  Abdominal:     General: Bowel sounds are normal. There is no distension.     Palpations: Abdomen is soft.     Tenderness: There is no abdominal tenderness. There is no rebound.  Musculoskeletal:     Cervical back: Normal range of motion.  Skin:    General: Skin is warm and dry.  Neurological:     Mental Status: She is alert and oriented to person, place, and time.     Coordination: Coordination normal.     Vitals:   05/30/22 1447  BP: 130/78  Pulse: (!) 104  Resp: 18  SpO2: 98%  Weight: 207 lb 6.4 oz (94.1 kg)  Height: 5\' 6"  (1.676 m)    Assessment & Plan:

## 2022-05-30 NOTE — Patient Instructions (Signed)
We will check the labs today. 

## 2022-05-30 NOTE — Assessment & Plan Note (Signed)
Due for follow up HgA1c today. Previously stable prediabetes. She is drinking more juice lately while trying to stop smoking. Adjust as needed.

## 2022-05-30 NOTE — Assessment & Plan Note (Signed)
She is trying to quit smoking and encouragement given. Reminded of risks and harms of smoking.

## 2022-06-03 ENCOUNTER — Telehealth: Payer: Self-pay

## 2022-06-03 DIAGNOSIS — I10 Essential (primary) hypertension: Secondary | ICD-10-CM

## 2022-06-03 MED ORDER — METOPROLOL TARTRATE 100 MG PO TABS: 100.0000 mg | ORAL_TABLET | Freq: Two times a day (BID) | ORAL | 3 refills | Status: DC

## 2022-06-03 NOTE — Telephone Encounter (Signed)
Refill has been sent to the pt's pharmacy  

## 2022-06-03 NOTE — Telephone Encounter (Signed)
Pt is requesting a refill on: metoprolol tartrate (LOPRESSOR) 100 MG tablet  Pharmacy: [facility] [address]  LOV  05/30/22 ROV 12/05/22  Please advise   Pt also received lab results while requesting the refill.  I advised the pt of Dr. Sharlet Salina result note stating Sugars are pre-diabetes we should continue to monitor every 6-12 months. Otherwise labs normal/stable.   FYI

## 2022-07-04 ENCOUNTER — Telehealth: Payer: Self-pay | Admitting: Internal Medicine

## 2022-07-04 NOTE — Telephone Encounter (Signed)
Pt is requesting a callback. She would like to discuss some bowel issues. Pt did not disclose the exact issues, stated she would advise nurse when she gets a callback.   CB: [phone]

## 2022-07-04 NOTE — Telephone Encounter (Signed)
Spoke with the patient and she stated that she is having bowel movements. Every time she eats 10 mins later she finds herself going to the restroom having a bowel movement. She would like to know if there is something she can for this.

## 2022-07-04 NOTE — Telephone Encounter (Signed)
I do not have enough information to proceed. Likely needs apt but we do not have info about when this started, what has been tried, any blood in bowel movement?

## 2022-07-04 NOTE — Telephone Encounter (Signed)
Spoke with the patient and she stated that she is not having any blood in her stool. She was told by a doctor( unable to remember the name) that she should be taking something in a green bottle. Her symptoms started earlier this week

## 2022-07-04 NOTE — Telephone Encounter (Signed)
Should have visit

## 2022-12-01 ENCOUNTER — Ambulatory Visit: Payer: Medicare Other | Admitting: Internal Medicine

## 2022-12-05 ENCOUNTER — Ambulatory Visit: Payer: Medicare Other | Admitting: Internal Medicine

## 2022-12-26 ENCOUNTER — Encounter: Payer: Self-pay | Admitting: Internal Medicine

## 2022-12-26 ENCOUNTER — Ambulatory Visit (INDEPENDENT_AMBULATORY_CARE_PROVIDER_SITE_OTHER): Payer: Medicare Other | Admitting: Internal Medicine

## 2022-12-26 VITALS — BP 140/80 | HR 109 | Temp 98.4°F | Ht 66.0 in | Wt 187.0 lb

## 2022-12-26 DIAGNOSIS — R7303 Prediabetes: Secondary | ICD-10-CM | POA: Diagnosis not present

## 2022-12-26 LAB — POCT GLYCOSYLATED HEMOGLOBIN (HGB A1C): HbA1c POC (<> result, manual entry): 6 % (ref 4.0–5.6)

## 2022-12-26 NOTE — Assessment & Plan Note (Signed)
POC HgA1c done today and improved to 6.0. Counseled about diet and exercise to help. Advised that family history and medications she is taking can cause her to be more at risk for diabetes in the future.

## 2022-12-26 NOTE — Patient Instructions (Signed)
Your sugars are stable and still pre-diabetes.

## 2022-12-26 NOTE — Progress Notes (Signed)
   Subjective:   Patient ID: Jackie Odonnell, female    DOB: Mar 16, 1961, 62 y.o.   MRN: [id number]  HPI The patient is a 62 YO female coming in for follow up.   Review of Systems  Constitutional: Negative.   HENT: Negative.    Eyes: Negative.   Respiratory:  Negative for cough, chest tightness and shortness of breath.   Cardiovascular:  Negative for chest pain, palpitations and leg swelling.  Gastrointestinal:  Negative for abdominal distention, abdominal pain, constipation, diarrhea, nausea and vomiting.  Musculoskeletal: Negative.   Skin: Negative.   Neurological: Negative.   Psychiatric/Behavioral: Negative.      Objective:  Physical Exam Constitutional:      Appearance: She is well-developed.  HENT:     Head: Normocephalic and atraumatic.  Cardiovascular:     Rate and Rhythm: Normal rate and regular rhythm.  Pulmonary:     Effort: Pulmonary effort is normal. No respiratory distress.     Breath sounds: Normal breath sounds. No wheezing or rales.  Abdominal:     General: Bowel sounds are normal. There is no distension.     Palpations: Abdomen is soft.     Tenderness: There is no abdominal tenderness. There is no rebound.  Musculoskeletal:     Cervical back: Normal range of motion.  Skin:    General: Skin is warm and dry.  Neurological:     Mental Status: She is alert and oriented to person, place, and time.     Coordination: Coordination normal.     Vitals:   12/26/22 1548  BP: (!) 140/80  Pulse: (!) 109  Temp: 98.4 F (36.9 C)  TempSrc: Oral  SpO2: 97%  Weight: 187 lb (84.8 kg)  Height: 5\' 6"  (1.676 m)    Assessment & Plan:

## 2023-01-26 ENCOUNTER — Telehealth: Payer: Self-pay | Admitting: Internal Medicine

## 2023-01-26 ENCOUNTER — Other Ambulatory Visit: Payer: Self-pay

## 2023-01-26 NOTE — Telephone Encounter (Signed)
Please call patient concerning her medication - she does not want to disclose any information

## 2023-01-26 NOTE — Telephone Encounter (Signed)
Spoke with patient and have sent over to Dr Sharlet Salina

## 2023-01-26 NOTE — Telephone Encounter (Signed)
Called pt no answer LMOM RTC.../lmb 

## 2023-02-23 DIAGNOSIS — F250 Schizoaffective disorder, bipolar type: Secondary | ICD-10-CM | POA: Diagnosis not present

## 2023-04-10 ENCOUNTER — Telehealth: Payer: Self-pay

## 2023-04-10 NOTE — Telephone Encounter (Signed)
Patient has been scheduled to address her concerns

## 2023-04-10 NOTE — Telephone Encounter (Signed)
Pt is asking that Dr. Okey Odonnell nurse call her back about her gout. She states she ate some food from cookout and wants to discuss this with Jackie Odonnell nurse ONLY! Reach pt at [phone]

## 2023-04-24 ENCOUNTER — Ambulatory Visit: Payer: Medicare Other | Admitting: Internal Medicine

## 2023-05-04 ENCOUNTER — Telehealth: Payer: Self-pay | Admitting: Internal Medicine

## 2023-05-04 NOTE — Telephone Encounter (Signed)
Called patient to schedule Medicare Annual Wellness Visit (AWV). Left message for patient to call back and schedule Medicare Annual Wellness Visit (AWV).  Last date of AWV: 04/21/2022  Please schedule an appointment at any time with [facility].  If any questions, please contact me at [phone].  Thank you ,  Bernice Cicero Care Guide [facility] AWV TEAM Direct Dial: [phone]   

## 2023-06-01 ENCOUNTER — Other Ambulatory Visit: Payer: Self-pay | Admitting: Internal Medicine

## 2023-07-06 ENCOUNTER — Ambulatory Visit (INDEPENDENT_AMBULATORY_CARE_PROVIDER_SITE_OTHER): Payer: Medicare Other

## 2023-07-06 VITALS — Ht 66.0 in | Wt 193.0 lb

## 2023-07-06 DIAGNOSIS — Z122 Encounter for screening for malignant neoplasm of respiratory organs: Secondary | ICD-10-CM | POA: Diagnosis not present

## 2023-07-06 DIAGNOSIS — Z135 Encounter for screening for eye and ear disorders: Secondary | ICD-10-CM | POA: Diagnosis not present

## 2023-07-06 DIAGNOSIS — Z0000 Encounter for general adult medical examination without abnormal findings: Secondary | ICD-10-CM | POA: Diagnosis not present

## 2023-07-06 NOTE — Progress Notes (Signed)
Subjective:   Jackie Odonnell is a 62 y.o. female who presents for Medicare Annual (Subsequent) preventive examination.  Visit Complete: Virtual  I connected with  Jackie Odonnell on 07/06/23 by a audio enabled telemedicine application and verified that I am speaking with the correct person using two identifiers.  Patient Location: Home  Provider Location: Office/Clinic  I discussed the limitations of evaluation and management by telemedicine. The patient expressed understanding and agreed to proceed.  Review of Systems     Cardiac Risk Factors include: advanced age (>35men, >74 women);hypertension;obesity (BMI >30kg/m2);smoking/ tobacco exposure     Objective:    Today's Vitals   07/06/23 1131  Weight: 193 lb (87.5 kg)  Height: 5\' 6"  (1.676 m)  PainSc: 0-No pain   Body mass index is 31.15 kg/m.     07/06/2023   11:34 AM 04/21/2022   12:04 PM 04/12/2021   12:36 PM 01/02/2020    2:47 PM 11/25/2018   12:52 PM 06/16/2017    1:20 PM 08/07/2012    3:00 PM  Advanced Directives  Does Patient Have a Medical Advance Directive? No No No No No No   Would patient like information on creating a medical advance directive? No - Patient declined No - Patient declined No - Patient declined Yes (ED - Information included in AVS) No - Patient declined Yes (ED - Information included in AVS)   Pre-existing out of facility DNR order (yellow form or pink MOST form)            Information is confidential and restricted. Go to Review Flowsheets to unlock data.    Current Medications (verified) Outpatient Encounter Medications as of 07/06/2023  Medication Sig   amLODipine (NORVASC) 10 MG tablet Take 1 tablet by mouth once daily   ARIPiprazole (ABILIFY) 20 MG tablet    benztropine (COGENTIN) 0.5 MG tablet Take 0.5 mg by mouth 2 (two) times daily.   busPIRone (BUSPAR) 10 MG tablet Take 10 mg by mouth 3 (three) times daily.    hydrOXYzine (ATARAX/VISTARIL) 25 MG tablet    lamoTRIgine (LAMICTAL)  100 MG tablet Take 100 mg by mouth 2 (two) times daily.   lisinopril (ZESTRIL) 40 MG tablet TAKE 1 TABLET(40 MG) BY MOUTH DAILY   metoprolol tartrate (LOPRESSOR) 100 MG tablet Take 1 tablet (100 mg total) by mouth 2 (two) times daily.   Multiple Vitamins-Iron (MULTIVITAMINS WITH IRON) TABS tablet Take 1 tablet by mouth daily.   Potassium Chloride ER 20 MEQ TBCR TAKE 1 TABLET BY MOUTH DAILY   potassium chloride SA (KLOR-CON M) 20 MEQ tablet TAKE 1 TABLET EVERY DAY   No facility-administered encounter medications on file as of 07/06/2023.    Allergies (verified) Patient has no known allergies.   History: Past Medical History:  Diagnosis Date   Anxiety    Bipolar affective (HCC)    Chemical Imbalance   Depression    Hypertension    Past Surgical History:  Procedure Laterality Date   ABDOMINAL HYSTERECTOMY     ECTOPIC PREGNANCY SURGERY     intestines removed     scar tissue issue   Family History  Problem Relation Age of Onset   Colon cancer Neg Hx    Esophageal cancer Neg Hx    Rectal cancer Neg Hx    Stomach cancer Neg Hx    Social History   Socioeconomic History   Marital status: Single    Spouse name: Not on file   Number of children: 0  Years of education: 15   Highest education level: Not on file  Occupational History   Occupation: Disability    Comment: Schizophrenia  Tobacco Use   Smoking status: Every Day    Current packs/day: 2.00    Average packs/day: 2.0 packs/day for 15.0 years (30.0 ttl pk-yrs)    Types: Cigars, Cigarettes   Smokeless tobacco: Never   Tobacco comments:    07/06/23: Patient stated she has quit smoking cigarettes but now smokes 5 Black & Mild Cigars per day.  Vaping Use   Vaping status: Never Used  Substance and Sexual Activity   Alcohol use: No    Alcohol/week: 0.0 standard drinks of alcohol   Drug use: No   Sexual activity: Not Currently  Other Topics Concern   Not on file  Social History Narrative   Born and raised in  [place], [place]. Currently resides in an apartment by herself. No pets. Fun: Watching tv and walking. Denies religious/spiritual beliefs effecting health care.    Social Determinants of Health   Financial Resource Strain: Low Risk  (07/06/2023)   Overall Financial Resource Strain (CARDIA)    Difficulty of Paying Living Expenses: Not hard at all  Food Insecurity: No Food Insecurity (07/06/2023)   Hunger Vital Sign    Worried About Running Out of Food in the Last Year: Never true    Ran Out of Food in the Last Year: Never true  Transportation Needs: No Transportation Needs (07/06/2023)   PRAPARE - Administrator, Civil Service (Medical): No    Lack of Transportation (Non-Medical): No  Physical Activity: Insufficiently Active (07/06/2023)   Exercise Vital Sign    Days of Exercise per Week: 7 days    Minutes of Exercise per Session: 20 min  Stress: No Stress Concern Present (07/06/2023)   [employer] of Occupational Health - Occupational Stress Questionnaire    Feeling of Stress : Not at all  Social Connections: Socially Isolated (07/06/2023)   Social Connection and Isolation Panel [NHANES]    Frequency of Communication with Friends and Family: More than three times a week    Frequency of Social Gatherings with Friends and Family: More than three times a week    Attends Religious Services: Never    Database administrator or Organizations: No    Attends Banker Meetings: Never    Marital Status: Never married    Tobacco Counseling Ready to quit: Not Answered Counseling given: Not Answered Tobacco comments: 07/06/23: Patient stated she has quit smoking cigarettes but now smokes 5 Black & Mild Cigars per day.   Clinical Intake:  Pre-visit preparation completed: Yes  Pain : No/denies pain Pain Score: 0-No pain     BMI - recorded: 31.15 Nutritional Status: BMI > 30  Obese Nutritional Risks: None Diabetes: No  How often do you need to have someone help  you when you read instructions, pamphlets, or other written materials from your doctor or pharmacy?: 1 - Never What is the last grade level you completed in school?: HSG; 3.5 YEARS OF COLLEGE  Interpreter Needed?: No  Information entered by :: Faythe Heitzenrater N. Khristie Sak, LPN.   Activities of Daily Living    07/06/2023   11:38 AM  In your present state of health, do you have any difficulty performing the following activities:  Hearing? 0  Vision? 0  Difficulty concentrating or making decisions? 1  Walking or climbing stairs? 0  Dressing or bathing? 0  Doing errands, shopping? 0  Preparing Food and eating ? N  Using the Toilet? N  In the past six months, have you accidently leaked urine? N  Do you have problems with loss of bowel control? N  Managing your Medications? N  Managing your Finances? N  Housekeeping or managing your Housekeeping? N    Patient Care Team: Myrlene Broker, MD as PCP - General (Internal Medicine) Mengistu, Alemu T, RN as Registered Nurse (Behavioral Health)  Indicate any recent Medical Services you may have received from other than [facility] providers in the past year (date may be approximate).     Assessment:   This is a routine wellness examination for [place].  Hearing/Vision screen Hearing Screening - Comments:: Denies hearing difficulties   Vision Screening - Comments:: Wears reading glasses - need to refer to new eye doctor.   Dietary issues and exercise activities discussed:     Goals Addressed             This Visit's Progress    Quit Smoking Goals:       Manage cravings and triggers by practicing mindfulness and relaxation techniques. Create a personalized quit plan. Enlist support from friends and family. Reward yourself for accomplishments. Find alternative activities. Make healthier choices. Prepare for difficult situations. Identify reasons to quit.      Depression Screen    07/06/2023   11:57 AM 12/26/2022    3:57 PM  12/26/2022    3:50 PM 05/30/2022    2:53 PM 04/21/2022   11:58 AM 04/12/2021    1:00 PM 04/12/2021   12:41 PM  PHQ 2/9 Scores  PHQ - 2 Score 0 0 0 2 0 0 0  PHQ- 9 Score 1 0 0 11       Fall Risk    07/06/2023   11:35 AM 12/26/2022    3:50 PM 05/30/2022    2:53 PM 04/21/2022   12:03 PM 04/12/2021   12:36 PM  Fall Risk   Falls in the past year? 0 0 0 0 1  Number falls in past yr: 0 0 0 0 0  Injury with Fall? 0 0 0 0 0  Risk for fall due to : No Fall Risks   No Fall Risks   Follow up Falls prevention discussed Falls evaluation completed   Falls evaluation completed    MEDICARE RISK AT HOME:  Medicare Risk at Home - 07/06/23 1134     Any stairs in or around the home? No    If so, are there any without handrails? No    Home free of loose throw rugs in walkways, pet beds, electrical cords, etc? Yes    Adequate lighting in your home to reduce risk of falls? Yes    Life alert? No    Use of a cane, walker or w/c? No    Grab bars in the bathroom? No    Shower chair or bench in shower? No    Elevated toilet seat or a handicapped toilet? No             TIMED UP AND GO:  Was the test performed?  No    Cognitive Function:        07/06/2023   12:00 PM 04/21/2022   12:04 PM  6CIT Screen  What Year? 0 points 0 points  What month? 0 points 0 points  What time? 0 points 0 points  Count back from 20 0 points 0 points  Months in reverse 0 points 0 points  Repeat phrase 0 points 0 points  Total Score 0 points 0 points    Immunizations Immunization History  Administered Date(s) Administered   Influenza,inj,Quad PF,6+ Mos 11/07/2014, 10/04/2015, 11/25/2018, 08/27/2021   PFIZER(Purple Top)SARS-COV-2 Vaccination 04/28/2020, 05/19/2020   Td 09/22/1999, 04/25/2009    TDAP status: Due, Education has been provided regarding the importance of this vaccine. Advised may receive this vaccine at local pharmacy or Health Dept. Aware to provide a copy of the vaccination record if obtained from local  pharmacy or Health Dept. Verbalized acceptance and understanding.  Flu Vaccine status: Declined, Education has been provided regarding the importance of this vaccine but patient still declined. Advised may receive this vaccine at local pharmacy or Health Dept. Aware to provide a copy of the vaccination record if obtained from local pharmacy or Health Dept. Verbalized acceptance and understanding.  Pneumococcal vaccine status: Declined,  Education has been provided regarding the importance of this vaccine but patient still declined. Advised may receive this vaccine at local pharmacy or Health Dept. Aware to provide a copy of the vaccination record if obtained from local pharmacy or Health Dept. Verbalized acceptance and understanding.   Covid-19 vaccine status: Completed vaccines  Qualifies for Shingles Vaccine? Yes   Zostavax completed No   Shingrix Completed?: No.    Education has been provided regarding the importance of this vaccine. Patient has been advised to call insurance company to determine out of pocket expense if they have not yet received this vaccine. Advised may also receive vaccine at local pharmacy or Health Dept. Verbalized acceptance and understanding.  Screening Tests Health Maintenance  Topic Date Due   Lung Cancer Screening  Never done   Zoster Vaccines- Shingrix (1 of 2) Never done   DTaP/Tdap/Td (3 - Tdap) 04/26/2019   PAP SMEAR-Modifier  09/21/2020   COVID-19 Vaccine (3 - 2023-24 season) 08/22/2022   MAMMOGRAM  01/28/2023   INFLUENZA VACCINE  07/23/2023   Fecal DNA (Cologuard)  03/04/2024   Medicare Annual Wellness (AWV)  07/05/2024   Hepatitis C Screening  Completed   HIV Screening  Completed   HPV VACCINES  Aged Out    Health Maintenance  Health Maintenance Due  Topic Date Due   Lung Cancer Screening  Never done   Zoster Vaccines- Shingrix (1 of 2) Never done   DTaP/Tdap/Td (3 - Tdap) 04/26/2019   PAP SMEAR-Modifier  09/21/2020   COVID-19 Vaccine (3 -  2023-24 season) 08/22/2022   MAMMOGRAM  01/28/2023    Colorectal cancer screening: Type of screening: Cologuard. Completed 03/04/2021. Repeat every 3 years  Mammogram status: Completed 01/28/2021. Repeat every year (Patient stated that she will schedule with OB/GYN).  Bone Density status: Completed 09/04/2015. Results reflect: Bone density results: OSTEOPOROSIS. Repeat every 2 years. (Patient stated that she will schedule with OB/GYN).  Lung Cancer Screening: (Low Dose CT Chest recommended if Age 15-80 years, 20 pack-year currently smoking OR have quit w/in 15years.) does qualify.   Lung Cancer Screening Referral: placed 07/06/2023  Additional Screening:  Hepatitis C Screening: does qualify; Completed 05/14/2018  Vision Screening: Recommended annual ophthalmology exams for early detection of glaucoma and other disorders of the eye. Is the patient up to date with their annual eye exam?  No  Who is the provider or what is the name of the office in which the patient attends annual eye exams? Referral placed to Sallye Lat, MD. If pt is not established with a provider, would they like to be referred to a provider to establish care?  Yes .   Dental Screening: Recommended annual dental exams for proper oral hygiene  Diabetic Foot Exam: Not diabetic.  Community Resource Referral / Chronic Care Management: CRR required this visit?  Yes   CCM required this visit?  No     Plan:     I have personally reviewed and noted the following in the patient's chart:   Medical and social history Use of alcohol, tobacco or illicit drugs  Current medications and supplements including opioid prescriptions. Patient is not currently taking opioid prescriptions. Functional ability and status Nutritional status Physical activity Advanced directives List of other physicians Hospitalizations, surgeries, and ER visits in previous 12 months Vitals Screenings to include cognitive, depression, and  falls Referrals and appointments  In addition, I have reviewed and discussed with patient certain preventive protocols, quality metrics, and best practice recommendations. A written personalized care plan for preventive services as well as general preventive health recommendations were provided to patient.     Mickeal Needy, LPN   [id number]/[id number]   After Visit Summary: (Mail) Due to this being a telephonic visit, the after visit summary with patients personalized plan was offered to patient via mail   Nurse Notes: Per patient no change in vitals since last visit, unable to obtain new vitals due to telehealth visit.

## 2023-07-06 NOTE — Patient Instructions (Addendum)
Jackie Odonnell , Thank you for taking time to come for your Medicare Wellness Visit. I appreciate your ongoing commitment to your health goals. Please review the following plan we discussed and let me know if I can assist you in the future.   These are the goals we discussed:  Goals      Quit Smoking Goals:     Manage cravings and triggers by practicing mindfulness and relaxation techniques. Create a personalized quit plan. Enlist support from friends and family. Reward yourself for accomplishments. Find alternative activities. Make healthier choices. Prepare for difficult situations. Identify reasons to quit.        This is a list of the screening recommended for you and due dates:  Health Maintenance  Topic Date Due   Screening for Lung Cancer  Never done   Zoster (Shingles) Vaccine (1 of 2) Never done   DTaP/Tdap/Td vaccine (3 - Tdap) 04/26/2019   Pap Smear  09/21/2020   COVID-19 Vaccine (3 - 2023-24 season) 08/22/2022   Mammogram  01/28/2023   Flu Shot  07/23/2023   Cologuard (Stool DNA test)  03/04/2024   Medicare Annual Wellness Visit  07/05/2024   Hepatitis C Screening  Completed   HIV Screening  Completed   HPV Vaccine  Aged Out    Advanced directives: No; Advance directive discussed with you today. Even though you declined this today please call our office should you change your mind and we can give you the proper paperwork for you to fill out.  Conditions/risks identified: Yes  Next appointment: It was nice speaking with you today!  Please follow up in one year for your annual wellness visit via telephone call with Nurse Percell Miller on 07/06/2024 at 11:15 a.m.  If you need to cancel or reschedule please call [phone].  Preventive Care 40-64 Years, Female Preventive care refers to lifestyle choices and visits with your health care provider that can promote health and wellness. What does preventive care include? A yearly physical exam. This is also called an annual  well check. Dental exams once or twice a year. Routine eye exams. Ask your health care provider how often you should have your eyes checked. Personal lifestyle choices, including: Daily care of your teeth and gums. Regular physical activity. Eating a healthy diet. Avoiding tobacco and drug use. Limiting alcohol use. Practicing safe sex. Taking low-dose aspirin daily starting at age 25. Taking vitamin and mineral supplements as recommended by your health care provider. What happens during an annual well check? The services and screenings done by your health care provider during your annual well check will depend on your age, overall health, lifestyle risk factors, and family history of disease. Counseling  Your health care provider may ask you questions about your: Alcohol use. Tobacco use. Drug use. Emotional well-being. Home and relationship well-being. Sexual activity. Eating habits. Work and work Astronomer. Method of birth control. Menstrual cycle. Pregnancy history. Screening  You may have the following tests or measurements: Height, weight, and BMI. Blood pressure. Lipid and cholesterol levels. These may be checked every 5 years, or more frequently if you are over 31 years old. Skin check. Lung cancer screening. You may have this screening every year starting at age 35 if you have a 30-pack-year history of smoking and currently smoke or have quit within the past 15 years. Fecal occult blood test (FOBT) of the stool. You may have this test every year starting at age 82. Flexible sigmoidoscopy or colonoscopy. You may have a  sigmoidoscopy every 5 years or a colonoscopy every 10 years starting at age 64. Hepatitis C blood test. Hepatitis B blood test. Sexually transmitted disease (STD) testing. Diabetes screening. This is done by checking your blood sugar (glucose) after you have not eaten for a while (fasting). You may have this done every 1-3 years. Mammogram. This may be  done every 1-2 years. Talk to your health care provider about when you should start having regular mammograms. This may depend on whether you have a family history of breast cancer. BRCA-related cancer screening. This may be done if you have a family history of breast, ovarian, tubal, or peritoneal cancers. Pelvic exam and Pap test. This may be done every 3 years starting at age 20. Starting at age 25, this may be done every 5 years if you have a Pap test in combination with an HPV test. Bone density scan. This is done to screen for osteoporosis. You may have this scan if you are at high risk for osteoporosis. Discuss your test results, treatment options, and if necessary, the need for more tests with your health care provider. Vaccines  Your health care provider may recommend certain vaccines, such as: Influenza vaccine. This is recommended every year. Tetanus, diphtheria, and acellular pertussis (Tdap, Td) vaccine. You may need a Td booster every 10 years. Zoster vaccine. You may need this after age 83. Pneumococcal 13-valent conjugate (PCV13) vaccine. You may need this if you have certain conditions and were not previously vaccinated. Pneumococcal polysaccharide (PPSV23) vaccine. You may need one or two doses if you smoke cigarettes or if you have certain conditions. Talk to your health care provider about which screenings and vaccines you need and how often you need them. This information is not intended to replace advice given to you by your health care provider. Make sure you discuss any questions you have with your health care provider. Document Released: 01/04/2016 Document Revised: 08/27/2016 Document Reviewed: 10/09/2015 Elsevier Interactive Patient Education  2017 [employer].    Fall Prevention in the Home Falls can cause injuries. They can happen to people of all ages. There are many things you can do to make your home safe and to help prevent falls. What can I do on the outside  of my home? Regularly fix the edges of walkways and driveways and fix any cracks. Remove anything that might make you trip as you walk through a door, such as a raised step or threshold. Trim any bushes or trees on the path to your home. Use bright outdoor lighting. Clear any walking paths of anything that might make someone trip, such as rocks or tools. Regularly check to see if handrails are loose or broken. Make sure that both sides of any steps have handrails. Any raised decks and porches should have guardrails on the edges. Have any leaves, snow, or ice cleared regularly. Use sand or salt on walking paths during winter. Clean up any spills in your garage right away. This includes oil or grease spills. What can I do in the bathroom? Use night lights. Install grab bars by the toilet and in the tub and shower. Do not use towel bars as grab bars. Use non-skid mats or decals in the tub or shower. If you need to sit down in the shower, use a plastic, non-slip stool. Keep the floor dry. Clean up any water that spills on the floor as soon as it happens. Remove soap buildup in the tub or shower regularly. Attach bath mats  securely with double-sided non-slip rug tape. Do not have throw rugs and other things on the floor that can make you trip. What can I do in the bedroom? Use night lights. Make sure that you have a light by your bed that is easy to reach. Do not use any sheets or blankets that are too big for your bed. They should not hang down onto the floor. Have a firm chair that has side arms. You can use this for support while you get dressed. Do not have throw rugs and other things on the floor that can make you trip. What can I do in the kitchen? Clean up any spills right away. Avoid walking on wet floors. Keep items that you use a lot in easy-to-reach places. If you need to reach something above you, use a strong step stool that has a grab bar. Keep electrical cords out of the  way. Do not use floor polish or wax that makes floors slippery. If you must use wax, use non-skid floor wax. Do not have throw rugs and other things on the floor that can make you trip. What can I do with my stairs? Do not leave any items on the stairs. Make sure that there are handrails on both sides of the stairs and use them. Fix handrails that are broken or loose. Make sure that handrails are as long as the stairways. Check any carpeting to make sure that it is firmly attached to the stairs. Fix any carpet that is loose or worn. Avoid having throw rugs at the top or bottom of the stairs. If you do have throw rugs, attach them to the floor with carpet tape. Make sure that you have a light switch at the top of the stairs and the bottom of the stairs. If you do not have them, ask someone to add them for you. What else can I do to help prevent falls? Wear shoes that: Do not have high heels. Have rubber bottoms. Are comfortable and fit you well. Are closed at the toe. Do not wear sandals. If you use a stepladder: Make sure that it is fully opened. Do not climb a closed stepladder. Make sure that both sides of the stepladder are locked into place. Ask someone to hold it for you, if possible. Clearly mark and make sure that you can see: Any grab bars or handrails. First and last steps. Where the edge of each step is. Use tools that help you move around (mobility aids) if they are needed. These include: Canes. Walkers. Scooters. Crutches. Turn on the lights when you go into a dark area. Replace any light bulbs as soon as they burn out. Set up your furniture so you have a clear path. Avoid moving your furniture around. If any of your floors are uneven, fix them. If there are any pets around you, be aware of where they are. Review your medicines with your doctor. Some medicines can make you feel dizzy. This can increase your chance of falling. Ask your doctor what other things that you can  do to help prevent falls. This information is not intended to replace advice given to you by your health care provider. Make sure you discuss any questions you have with your health care provider. Document Released: 10/04/2009 Document Revised: 05/15/2016 Document Reviewed: 01/12/2015 Elsevier Interactive Patient Education  2017 [employer].

## 2023-07-10 ENCOUNTER — Ambulatory Visit: Payer: Medicare Other | Admitting: Internal Medicine

## 2023-07-21 ENCOUNTER — Telehealth: Payer: Self-pay | Admitting: Internal Medicine

## 2023-07-21 ENCOUNTER — Other Ambulatory Visit: Payer: Self-pay

## 2023-07-21 MED ORDER — LISINOPRIL 40 MG PO TABS: ORAL_TABLET | ORAL | 0 refills | Status: DC

## 2023-07-21 NOTE — Telephone Encounter (Signed)
Prescription Request  07/21/2023  LOV: 12/26/2022  What is the name of the medication or equipment? lisinopril  Have you contacted your pharmacy to request a refill? Yes   Which pharmacy would you like this sent to?  [id number] [place], [place] - [address] [place] [address] Phone: [phone] Fax: [phone]    Patient notified that their request is being sent to the clinical staff for review and that they should receive a response within 2 business days.   Please advise at Mobile [phone] (mobile)

## 2023-07-21 NOTE — Telephone Encounter (Signed)
Sent in

## 2023-07-30 ENCOUNTER — Other Ambulatory Visit: Payer: Self-pay | Admitting: Internal Medicine

## 2023-07-30 DIAGNOSIS — I10 Essential (primary) hypertension: Secondary | ICD-10-CM

## 2023-08-03 ENCOUNTER — Ambulatory Visit (INDEPENDENT_AMBULATORY_CARE_PROVIDER_SITE_OTHER): Payer: Medicare Other | Admitting: Internal Medicine

## 2023-08-03 ENCOUNTER — Encounter: Payer: Self-pay | Admitting: Internal Medicine

## 2023-08-03 VITALS — BP 132/74 | HR 109 | Temp 98.3°F | Ht 66.0 in | Wt 166.1 lb

## 2023-08-03 DIAGNOSIS — R7303 Prediabetes: Secondary | ICD-10-CM

## 2023-08-03 DIAGNOSIS — I10 Essential (primary) hypertension: Secondary | ICD-10-CM

## 2023-08-03 DIAGNOSIS — J410 Simple chronic bronchitis: Secondary | ICD-10-CM | POA: Diagnosis not present

## 2023-08-03 DIAGNOSIS — R718 Other abnormality of red blood cells: Secondary | ICD-10-CM

## 2023-08-03 LAB — COMPREHENSIVE METABOLIC PANEL
ALT: 12 U/L (ref 0–35)
AST: 12 U/L (ref 0–37)
Albumin: 4.4 g/dL (ref 3.5–5.2)
Alkaline Phosphatase: 107 U/L (ref 39–117)
BUN: 11 mg/dL (ref 6–23)
CO2: 26 mEq/L (ref 19–32)
Calcium: 10 mg/dL (ref 8.4–10.5)
Chloride: 103 mEq/L (ref 96–112)
Creatinine, Ser: 0.82 mg/dL (ref 0.40–1.20)
GFR: 76.92 mL/min (ref >60.00)
Glucose, Bld: 91 mg/dL (ref 70–99)
Potassium: 3.9 mEq/L (ref 3.5–5.1)
Sodium: 136 mEq/L (ref 135–145)
Total Bilirubin: 0.8 mg/dL (ref 0.2–1.2)
Total Protein: 7 g/dL (ref 6.0–8.3)

## 2023-08-03 LAB — CBC
HCT: 42.1 % (ref 36.0–46.0)
Hemoglobin: 12.8 g/dL (ref 12.0–15.0)
MCHC: 30.4 g/dL (ref 30.0–36.0)
MCV: 71.9 fl — ABNORMAL LOW (ref 78.0–100.0)
Platelets: 302 10*3/uL (ref 150.0–400.0)
RBC: 5.85 Mil/uL — ABNORMAL HIGH (ref 3.87–5.11)
RDW: 18.3 % — ABNORMAL HIGH (ref 11.5–15.5)
WBC: 7.7 10*3/uL (ref 4.0–10.5)

## 2023-08-03 LAB — LIPID PANEL
Cholesterol: 146 mg/dL (ref 0–200)
HDL: 64.5 mg/dL (ref >39.00)
LDL Cholesterol: 56 mg/dL (ref 0–99)
NonHDL: 81.46
Total CHOL/HDL Ratio: 2
Triglycerides: 127 mg/dL (ref 0.0–149.0)
VLDL: 25.4 mg/dL (ref 0.0–40.0)

## 2023-08-03 LAB — HEMOGLOBIN A1C: Hgb A1c MFr Bld: 6 % (ref 4.6–6.5)

## 2023-08-03 NOTE — Progress Notes (Unsigned)
   Subjective:   Patient ID: Jackie Odonnell, female    DOB: January 23, 1961, 62 y.o.   MRN: [id number]  HPI The patient is a 62 YO female coming in for follow up.   Review of Systems  Constitutional: Negative.   HENT: Negative.    Eyes: Negative.   Respiratory:  Negative for cough, chest tightness and shortness of breath.   Cardiovascular:  Negative for chest pain, palpitations and leg swelling.  Gastrointestinal:  Negative for abdominal distention, abdominal pain, constipation, diarrhea, nausea and vomiting.  Musculoskeletal: Negative.   Skin: Negative.   Neurological: Negative.   Psychiatric/Behavioral: Negative.      Objective:  Physical Exam Constitutional:      Appearance: She is well-developed.  HENT:     Head: Normocephalic and atraumatic.  Cardiovascular:     Rate and Rhythm: Normal rate and regular rhythm.  Pulmonary:     Effort: Pulmonary effort is normal. No respiratory distress.     Breath sounds: Normal breath sounds. No wheezing or rales.  Abdominal:     General: Bowel sounds are normal. There is no distension.     Palpations: Abdomen is soft.     Tenderness: There is no abdominal tenderness. There is no rebound.  Musculoskeletal:     Cervical back: Normal range of motion.  Skin:    General: Skin is warm and dry.  Neurological:     Mental Status: She is alert and oriented to person, place, and time.     Coordination: Coordination normal.     Vitals:   08/03/23 1343  BP: 132/74  Pulse: (!) 109  Temp: 98.3 F (36.8 C)  TempSrc: Oral  SpO2: 97%  Weight: 166 lb 2 oz (75.4 kg)  Height: 5\' 6"  (1.676 m)    Assessment & Plan:

## 2023-08-03 NOTE — Patient Instructions (Signed)
We will check the labs today. 

## 2023-08-05 ENCOUNTER — Encounter: Payer: Self-pay | Admitting: Internal Medicine

## 2023-08-05 NOTE — Assessment & Plan Note (Signed)
Checking CBC and if stable remove problem from list.

## 2023-08-05 NOTE — Assessment & Plan Note (Addendum)
BP at goal on amlodipine 10 mg daily and lisinopril 40 mg daily and metoprolol 100 mg BID. Checking CMP and lipid panel adjust as needed.

## 2023-08-05 NOTE — Assessment & Plan Note (Signed)
Counseled to quit smoking and she is unable at this time. Counseled on harm/risk from smoking.

## 2023-08-05 NOTE — Assessment & Plan Note (Signed)
Checking HgA1c and adjust as needed.  

## 2023-08-07 ENCOUNTER — Other Ambulatory Visit: Payer: Self-pay | Admitting: Internal Medicine

## 2023-09-03 DIAGNOSIS — F200 Paranoid schizophrenia: Secondary | ICD-10-CM | POA: Diagnosis not present

## 2023-09-15 ENCOUNTER — Other Ambulatory Visit: Payer: Self-pay

## 2023-09-15 ENCOUNTER — Telehealth: Payer: Self-pay | Admitting: Internal Medicine

## 2023-09-15 DIAGNOSIS — I10 Essential (primary) hypertension: Secondary | ICD-10-CM

## 2023-09-15 MED ORDER — METOPROLOL TARTRATE 100 MG PO TABS
100.0000 mg | ORAL_TABLET | Freq: Two times a day (BID) | ORAL | 3 refills | Status: AC
Start: 2023-09-15 — End: ?

## 2023-09-15 NOTE — Telephone Encounter (Signed)
Sent in

## 2023-09-15 NOTE — Telephone Encounter (Signed)
Prescription Request  09/15/2023  LOV: 08/03/2023  What is the name of the medication or equipment? metoprolol tartrate (LOPRESSOR) 100 MG tablet   Have you contacted your pharmacy to request a refill? Yes   Which pharmacy would you like this sent to?  [id number] [place], [place] - [address] [place] [address] Phone: [phone] Fax: [phone]    Patient notified that their request is being sent to the clinical staff for review and that they should receive a response within 2 business days.   Please advise at Mobile [phone] (mobile)

## 2023-09-21 NOTE — Telephone Encounter (Signed)
Prescription should have been sent in to [facility] [place] - Please resend to correct pharmacy

## 2023-09-23 ENCOUNTER — Other Ambulatory Visit: Payer: Self-pay

## 2023-09-23 DIAGNOSIS — I10 Essential (primary) hypertension: Secondary | ICD-10-CM

## 2023-09-23 MED ORDER — METOPROLOL TARTRATE 100 MG PO TABS
100.0000 mg | ORAL_TABLET | Freq: Two times a day (BID) | ORAL | 3 refills | Status: DC
Start: 2023-09-23 — End: 2024-01-26

## 2023-09-23 NOTE — Telephone Encounter (Signed)
Patient is very upset because she has not been able to get her medication. She would like for it to be sent to [facility] on [address] in [place]. Best callback is [phone].

## 2023-09-25 ENCOUNTER — Other Ambulatory Visit: Payer: Self-pay | Admitting: Internal Medicine

## 2023-09-29 DIAGNOSIS — F200 Paranoid schizophrenia: Secondary | ICD-10-CM | POA: Diagnosis not present

## 2023-11-02 ENCOUNTER — Telehealth: Payer: Self-pay | Admitting: Internal Medicine

## 2023-11-02 NOTE — Telephone Encounter (Signed)
Prescription Request  11/02/2023  LOV: 08/03/2023  What is the name of the medication or equipment?  potassium chloride SA (KLOR-CON M) 20 MEQ table  Have you contacted your pharmacy to request a refill? No   Which phar  [facility] [address], [place] - [address] [place] [address] Phone: [phone] Fax: [phone] would you like this sent to?     Patient notified that their request is being sent to the clinical staff for review and that they should receive a response within 2 business days.   Please advise at Mobile [phone] (mobile)

## 2023-11-03 ENCOUNTER — Other Ambulatory Visit: Payer: Self-pay

## 2023-11-03 MED ORDER — POTASSIUM CHLORIDE CRYS ER 20 MEQ PO TBCR: 20.0000 meq | EXTENDED_RELEASE_TABLET | Freq: Every day | ORAL | 1 refills | Status: AC

## 2023-11-04 ENCOUNTER — Ambulatory Visit: Payer: Medicare Other | Admitting: Internal Medicine

## 2023-11-04 ENCOUNTER — Other Ambulatory Visit: Payer: Self-pay | Admitting: Internal Medicine

## 2023-12-18 ENCOUNTER — Telehealth: Payer: Self-pay | Admitting: Internal Medicine

## 2023-12-18 NOTE — Telephone Encounter (Signed)
Patient is calling in to discuss some symptoms she is having she would like the nurse to give her a call back

## 2023-12-22 NOTE — Telephone Encounter (Signed)
Called and spoke with patient and she stated that she is doing better

## 2024-01-09 ENCOUNTER — Other Ambulatory Visit: Payer: Self-pay | Admitting: Internal Medicine

## 2024-01-26 ENCOUNTER — Encounter: Payer: Self-pay | Admitting: Internal Medicine

## 2024-01-26 ENCOUNTER — Ambulatory Visit: Payer: Medicare Other | Admitting: Internal Medicine

## 2024-01-26 VITALS — BP 126/80 | HR 72 | Temp 98.1°F | Ht 66.0 in | Wt 166.0 lb

## 2024-01-26 DIAGNOSIS — F200 Paranoid schizophrenia: Secondary | ICD-10-CM

## 2024-01-26 DIAGNOSIS — J410 Simple chronic bronchitis: Secondary | ICD-10-CM | POA: Diagnosis not present

## 2024-01-26 DIAGNOSIS — I10 Essential (primary) hypertension: Secondary | ICD-10-CM | POA: Diagnosis not present

## 2024-01-26 DIAGNOSIS — Z23 Encounter for immunization: Secondary | ICD-10-CM

## 2024-01-26 DIAGNOSIS — R202 Paresthesia of skin: Secondary | ICD-10-CM

## 2024-01-26 DIAGNOSIS — E559 Vitamin D deficiency, unspecified: Secondary | ICD-10-CM | POA: Diagnosis not present

## 2024-01-26 DIAGNOSIS — R7303 Prediabetes: Secondary | ICD-10-CM | POA: Diagnosis not present

## 2024-01-26 LAB — COMPREHENSIVE METABOLIC PANEL
ALT: 12 U/L (ref 0–35)
AST: 12 U/L (ref 0–37)
Albumin: 4 g/dL (ref 3.5–5.2)
Alkaline Phosphatase: 94 U/L (ref 39–117)
BUN: 10 mg/dL (ref 6–23)
CO2: 29 meq/L (ref 19–32)
Calcium: 9 mg/dL (ref 8.4–10.5)
Chloride: 102 meq/L (ref 96–112)
Creatinine, Ser: 0.93 mg/dL (ref 0.40–1.20)
GFR: 65.91 mL/min (ref >60.00)
Glucose, Bld: 86 mg/dL (ref 70–99)
Potassium: 4.5 meq/L (ref 3.5–5.1)
Sodium: 135 meq/L (ref 135–145)
Total Bilirubin: 0.4 mg/dL (ref 0.2–1.2)
Total Protein: 6.4 g/dL (ref 6.0–8.3)

## 2024-01-26 LAB — CBC
HCT: 40.4 % (ref 36.0–46.0)
Hemoglobin: 12.4 g/dL (ref 12.0–15.0)
MCHC: 30.8 g/dL (ref 30.0–36.0)
MCV: 74.2 fL — ABNORMAL LOW (ref 78.0–100.0)
Platelets: 266 10*3/uL (ref 150.0–400.0)
RBC: 5.45 Mil/uL — ABNORMAL HIGH (ref 3.87–5.11)
RDW: 17.1 % — ABNORMAL HIGH (ref 11.5–15.5)
WBC: 7.9 10*3/uL (ref 4.0–10.5)

## 2024-01-26 LAB — HEMOGLOBIN A1C: Hgb A1c MFr Bld: 5.7 % (ref 4.6–6.5)

## 2024-01-26 LAB — VITAMIN B12: Vitamin B-12: 819 pg/mL (ref 211–911)

## 2024-01-26 LAB — VITAMIN D 25 HYDROXY (VIT D DEFICIENCY, FRACTURES): VITD: 26.69 ng/mL — ABNORMAL LOW (ref 30.00–100.00)

## 2024-01-26 LAB — TSH: TSH: 2.25 u[IU]/mL (ref 0.35–5.50)

## 2024-01-26 MED ORDER — METOPROLOL TARTRATE 100 MG PO TABS: 100.0000 mg | ORAL_TABLET | Freq: Two times a day (BID) | ORAL | 3 refills | Status: DC

## 2024-01-26 MED ORDER — AMLODIPINE BESYLATE 10 MG PO TABS: 10.0000 mg | ORAL_TABLET | Freq: Every day | ORAL | 3 refills | Status: DC

## 2024-01-26 MED ORDER — LISINOPRIL 40 MG PO TABS: 40.0000 mg | ORAL_TABLET | Freq: Every day | ORAL | 3 refills | Status: DC

## 2024-01-26 MED ORDER — POTASSIUM CHLORIDE ER 20 MEQ PO TBCR: 1.0000 | EXTENDED_RELEASE_TABLET | Freq: Every day | ORAL | 3 refills | Status: DC

## 2024-01-26 MED ORDER — ACETAMINOPHEN 325 MG PO TABS: 325.0000 mg | ORAL_TABLET | Freq: Four times a day (QID) | ORAL | 11 refills | Status: AC | PRN

## 2024-01-26 NOTE — Patient Instructions (Addendum)
We have sent in the medicines for you and we will check the blood levels.

## 2024-01-26 NOTE — Progress Notes (Signed)
   Subjective:   Patient ID: Jackie Odonnell, female    DOB: [birth year], 63 y.o.   MRN: [id number]  HPI The patient is a 63 YO female coming in for medical management see A/P for details. Some congestion for about 1 month off and on this happens.   Review of Systems  Constitutional: Negative.   HENT:  Positive for congestion.   Eyes: Negative.   Respiratory:  Negative for cough, chest tightness and shortness of breath.   Cardiovascular:  Negative for chest pain, palpitations and leg swelling.  Gastrointestinal:  Negative for abdominal distention, abdominal pain, constipation, diarrhea, nausea and vomiting.  Musculoskeletal: Negative.   Skin: Negative.   Neurological: Negative.   Psychiatric/Behavioral: Negative.      Objective:  Physical Exam Constitutional:      Appearance: She is well-developed.  HENT:     Head: Normocephalic and atraumatic.  Cardiovascular:     Rate and Rhythm: Normal rate and regular rhythm.  Pulmonary:     Effort: Pulmonary effort is normal. No respiratory distress.     Breath sounds: Normal breath sounds. No wheezing or rales.  Abdominal:     General: Bowel sounds are normal. There is no distension.     Palpations: Abdomen is soft.     Tenderness: There is no abdominal tenderness. There is no rebound.  Musculoskeletal:     Cervical back: Normal range of motion.  Skin:    General: Skin is warm and dry.  Neurological:     Mental Status: She is alert and oriented to person, place, and time.     Coordination: Coordination normal.     Vitals:   01/26/24 1537  BP: 126/80  Pulse: 72  Temp: 98.1 F (36.7 C)  TempSrc: Oral  SpO2: 96%  Weight: 166 lb (75.3 kg)  Height: 5' 6 (1.676 m)    Assessment & Plan:  Flu shot given at visit

## 2024-01-28 DIAGNOSIS — R202 Paresthesia of skin: Secondary | ICD-10-CM | POA: Insufficient documentation

## 2024-01-28 NOTE — Assessment & Plan Note (Signed)
 BP at goal taking amlodipine  10 mg daily and metoprolol  100 mg BID and lisinopril  40 mg daily. Checking CMP and CBC and adjust as needed.

## 2024-01-28 NOTE — Assessment & Plan Note (Signed)
 Seeing mental health and well controlled with current medications.

## 2024-01-28 NOTE — Assessment & Plan Note (Signed)
 Checking vitamin D  and B12 and TSH and HGA1c and CBC and CMP. Could be related to low back issues which she has. Treat as appropriate any metabolic causes.

## 2024-01-28 NOTE — Assessment & Plan Note (Signed)
 Still smoking black and mild cigars regularly and with some chronic cough and congestion. Advised to quit she is unable at this time. Reminded of harm/risk of smoking.

## 2024-01-28 NOTE — Assessment & Plan Note (Signed)
 Checking HGA1c and adjust as needed. She is on high risk medications making her more likely for metabolic issues.

## 2024-02-05 ENCOUNTER — Ambulatory Visit: Payer: Medicare Other

## 2024-03-04 ENCOUNTER — Telehealth: Payer: Self-pay | Admitting: Acute Care

## 2024-03-04 ENCOUNTER — Other Ambulatory Visit: Payer: Self-pay

## 2024-03-04 DIAGNOSIS — F17210 Nicotine dependence, cigarettes, uncomplicated: Secondary | ICD-10-CM

## 2024-03-04 DIAGNOSIS — Z122 Encounter for screening for malignant neoplasm of respiratory organs: Secondary | ICD-10-CM

## 2024-03-04 DIAGNOSIS — Z87891 Personal history of nicotine dependence: Secondary | ICD-10-CM

## 2024-03-04 NOTE — Telephone Encounter (Signed)
 Lung Cancer Screening Narrative/Criteria Questionnaire (Cigarette Smokers Only- No Cigars/Pipes/vapes)   Jackie Odonnell   SDMV:04/13/24 at 11:15a / [facility]                                           26-May-1961                         LDCT: 04/14/24 at 1:30p / [facility]    62 y.o.   Phone: [phone]/db  Lung Screening Narrative (confirm age 11-77 yrs Medicare / 50-80 yrs Private pay insurance)   Insurance information:medicare/medicaid    Referring Provider:[place]   This screening involves an initial phone call with a team member from our program. It is called a shared decision making visit. The initial meeting is required by insurance and Medicare to make sure you understand the program. This appointment takes about 15-20 minutes to complete. The CT scan will completed at a separate date/time. This scan takes about 5-10 minutes to complete and you may eat and drink before and after the scan.  Criteria questions for Lung Cancer Screening:   Are you a current or former smoker? Current Age began smoking: 63 yo   If you are a former smoker, what year did you quit smoking?NA   To calculate your smoking history, I need an accurate estimate of how many packs of cigarettes you smoked per day and for how many years. (Not just the number of PPD you are now smoking)   Years smoking 37 x Packs per day 1 = Pack years 37   (at least 20 pack yrs) *she smokes cigarettes and cigars but has been smoking cigarettes last few weeks also.  Her smoking hx was a little sketchy - started about age 36 years and after probing for an average, we finally agreed on about 1 PPD when defining only cigarettes  (Make sure they understand that we need to know how much they have smoked in the past, not just the number of PPD they are smoking now)  Do you have a personal history of cancer?  No    Do you have a family history of cancer? No  Are you coughing up blood?  No  Have you had unexplained weight loss of 15 lbs or more in  the last 6 months? No  It looks like you meet all criteria.     Additional information: N/A

## 2024-04-06 ENCOUNTER — Other Ambulatory Visit: Payer: Self-pay | Admitting: Internal Medicine

## 2024-04-06 NOTE — Telephone Encounter (Signed)
 Copied from CRM [id number]. Topic: Clinical - Medication Refill >> Apr 06, 2024  4:30 PM Adonis Hoot wrote: Most Recent Primary Care Visit:  Provider: Bambi Lever A  Department: [facility]  Visit Type: OFFICE VISIT  Date: 01/26/2024  Medication: potassium chloride SA (KLOR-CON M) 20 MEQ tablet  Has the patient contacted their pharmacy? Yes (Agent: If no, request that the patient contact the pharmacy for the refill. If patient does not wish to contact the pharmacy document the reason why and proceed with request.) (Agent: If yes, when and what did the pharmacy advise?)  Is this the correct pharmacy for this prescription? Yes If no, delete pharmacy and type the correct one.  This is the patient's preferred pharmacy:    [facility] [address] [place] - [address] [place] [address] Phone: [phone] Fax: [phone]   Has the prescription been filled recently? No  Is the patient out of the medication? Yes  Has the patient been seen for an appointment in the last year OR does the patient have an upcoming appointment? Yes  Can we respond through MyChart? Yes  *Patient would like to have this prescription switched over to the pharmacy above.Stated that [facility]  Agent: Please be advised that Rx refills may take up to 3 business days. We ask that you follow-up with your pharmacy.

## 2024-04-07 MED ORDER — POTASSIUM CHLORIDE ER 20 MEQ PO TBCR: 1.0000 | EXTENDED_RELEASE_TABLET | Freq: Every day | ORAL | 3 refills | Status: AC

## 2024-04-08 ENCOUNTER — Ambulatory Visit: Payer: Self-pay

## 2024-04-08 NOTE — Telephone Encounter (Signed)
 X3 unsuccessful attempts to reach pt made: will route information to PCP office.

## 2024-04-08 NOTE — Telephone Encounter (Signed)
 CRM was created r/t pt experiencing gas like s/s wanting advice on treatment: called pt back no answer: left voicemail.

## 2024-04-13 ENCOUNTER — Ambulatory Visit (INDEPENDENT_AMBULATORY_CARE_PROVIDER_SITE_OTHER): Admitting: Adult Health

## 2024-04-13 ENCOUNTER — Encounter: Payer: Self-pay | Admitting: Adult Health

## 2024-04-13 DIAGNOSIS — F17210 Nicotine dependence, cigarettes, uncomplicated: Secondary | ICD-10-CM | POA: Diagnosis not present

## 2024-04-13 NOTE — Patient Instructions (Signed)

## 2024-04-13 NOTE — Progress Notes (Signed)
  Virtual Visit via Telephone Note  I connected with Jackie Odonnell , 04/13/24 11:05 AM by a telemedicine application and verified that I am speaking with the correct person using two identifiers.  Location: Patient: home Provider: home   I discussed the limitations of evaluation and management by telemedicine and the availability of in person appointments. The patient expressed understanding and agreed to proceed.   Shared Decision Making Visit Lung Cancer Screening Program [phone])   Eligibility: 63 y.o. Pack Years Smoking History Calculation = 37 pack years  (# packs/per year x # years smoked) Recent History of coughing up blood  no Unexplained weight loss? no ( >Than 15 pounds within the last 6 months ) Prior History Lung / other cancer no (Diagnosis within the last 5 years already requiring surveillance chest CT Scans). Smoking Status Current Smoker  Visit Components: Discussion included one or more decision making aids. YES Discussion included risk/benefits of screening. YES Discussion included potential follow up diagnostic testing for abnormal scans. YES Discussion included meaning and risk of over diagnosis. YES Discussion included meaning and risk of False Positives. YES Discussion included meaning of total radiation exposure. YES  Counseling Included: Importance of adherence to annual lung cancer LDCT screening. YES Impact of comorbidities on ability to participate in the program. YES Ability and willingness to under diagnostic treatment. YES  Smoking Cessation Counseling: Current Smokers:  Discussed importance of smoking cessation. yes Information about tobacco cessation classes and interventions provided to patient. yes Patient provided with "ticket" for LDCT Scan. yes Symptomatic Patient. NO Diagnosis Code: Tobacco Use Z72.0 Asymptomatic Patient yes  Counseling (Intermediate counseling: > three minutes counseling) U0454  Z12.2-Screening of respiratory  organs Z87.891-Personal history of nicotine  dependence   [place] Dose 04/13/24

## 2024-04-14 ENCOUNTER — Ambulatory Visit (HOSPITAL_COMMUNITY)

## 2024-04-28 ENCOUNTER — Telehealth: Payer: Self-pay | Admitting: Internal Medicine

## 2024-04-28 NOTE — Telephone Encounter (Signed)
 Copied from CRM [id number]. Topic: Clinical - Medical Advice >> Apr 28, 2024 11:54 AM Magdalene School wrote: Reason for CRM: Patient calling because she has had a cold for about 3 weeks which is finally starting to get better, she is having runny nose, congestion, fever during the night where she wakes up sweaty. She stated that she is currently taking ibuprofen and nyquil. Patient stated that she finished a big bottle of nyquil and it has been helping. She would like to know if it's okay to continue to take more.

## 2024-05-13 ENCOUNTER — Other Ambulatory Visit: Payer: Self-pay | Admitting: Internal Medicine

## 2024-05-25 ENCOUNTER — Ambulatory Visit (INDEPENDENT_AMBULATORY_CARE_PROVIDER_SITE_OTHER): Admitting: Internal Medicine

## 2024-05-25 ENCOUNTER — Ambulatory Visit (HOSPITAL_COMMUNITY)

## 2024-05-25 ENCOUNTER — Ambulatory Visit (INDEPENDENT_AMBULATORY_CARE_PROVIDER_SITE_OTHER)

## 2024-05-25 ENCOUNTER — Encounter: Payer: Self-pay | Admitting: Internal Medicine

## 2024-05-25 VITALS — BP 122/80 | HR 85 | Temp 98.2°F | Ht 66.0 in | Wt 184.0 lb

## 2024-05-25 DIAGNOSIS — R053 Chronic cough: Secondary | ICD-10-CM

## 2024-05-25 DIAGNOSIS — R059 Cough, unspecified: Secondary | ICD-10-CM | POA: Diagnosis not present

## 2024-05-25 MED ORDER — PREDNISONE 20 MG PO TABS: 40.0000 mg | ORAL_TABLET | Freq: Every day | ORAL | 0 refills | Status: DC

## 2024-05-25 MED ORDER — PREDNISONE 20 MG PO TABS: 40.0000 mg | ORAL_TABLET | Freq: Every day | ORAL | 0 refills | Status: AC

## 2024-05-25 NOTE — Assessment & Plan Note (Signed)
 Suspect she has COPD and flare. Getting CT lung in a few weeks. Checking CXR today as none since 2023 and rx prednisone to help.

## 2024-05-25 NOTE — Patient Instructions (Signed)
 We will do the chest x-ray today.  We have sent in prednisone to take 2 pills daily for 5 days to help the lungs and sinuses improve.

## 2024-05-25 NOTE — Progress Notes (Signed)
   Subjective:   Patient ID: Jackie Odonnell, female    DOB: 01-13-1961, 63 y.o.   MRN: [id number]  Fever  Associated symptoms include congestion and coughing. Pertinent negatives include no abdominal pain, chest pain, diarrhea, nausea or vomiting.   The patient is a 63 YO female coming in for cough and congestion over the last 2 months. Taking allergy medicine daily. Tried nyquil and sinus medicine which helped. This does come and go. Still smoking. Some SOB at times.   Review of Systems  Constitutional:  Negative for fever.  HENT:  Positive for congestion and postnasal drip.   Eyes: Negative.   Respiratory:  Positive for cough and shortness of breath. Negative for chest tightness.   Cardiovascular:  Negative for chest pain, palpitations and leg swelling.  Gastrointestinal:  Negative for abdominal distention, abdominal pain, constipation, diarrhea, nausea and vomiting.  Musculoskeletal: Negative.   Skin: Negative.   Neurological: Negative.   Psychiatric/Behavioral: Negative.      Objective:  Physical Exam Constitutional:      Appearance: She is well-developed.  HENT:     Head: Normocephalic and atraumatic.     Comments: Oropharynx with redness and clear drainage, nose with swollen turbinates, TMs normal bilaterally.  Neck:     Thyroid : No thyromegaly.  Cardiovascular:     Rate and Rhythm: Normal rate and regular rhythm.  Pulmonary:     Effort: Pulmonary effort is normal. No respiratory distress.     Breath sounds: Wheezing present. No rales.  Abdominal:     General: Bowel sounds are normal. There is no distension.     Palpations: Abdomen is soft.     Tenderness: There is no abdominal tenderness. There is no rebound.  Musculoskeletal:        General: No tenderness.     Cervical back: Normal range of motion.  Lymphadenopathy:     Cervical: No cervical adenopathy.  Skin:    General: Skin is warm and dry.  Neurological:     Mental Status: She is alert and oriented to  person, place, and time.     Coordination: Coordination normal.     Vitals:   05/25/24 1012  BP: 122/80  Pulse: 85  Temp: 98.2 F (36.8 C)  TempSrc: Oral  SpO2: 99%  Weight: 184 lb (83.5 kg)  Height: 5\' 6"  (1.676 m)    Assessment & Plan:

## 2024-05-30 ENCOUNTER — Telehealth: Payer: Self-pay | Admitting: Internal Medicine

## 2024-05-30 ENCOUNTER — Ambulatory Visit: Payer: Self-pay | Admitting: Internal Medicine

## 2024-05-30 NOTE — Telephone Encounter (Signed)
 Copied from CRM [id number]. Topic: Referral - Request for Referral >> May 30, 2024 12:30 PM Jenice Mitts wrote: Did the patient discuss referral with their provider in the last year? Yes (If No - schedule appointment) (If Yes - send message)  Appointment offered? No  Type of order/referral and detailed reason for visit: Psychologist  Preference of office, provider, location: any recommendations   If referral order, have you been seen by this specialty before? No (If Yes, this issue or another issue? When? Where?  Can we respond through MyChart? Yes

## 2024-05-30 NOTE — Telephone Encounter (Signed)
 Need more information in regards to this

## 2024-05-31 NOTE — Telephone Encounter (Signed)
 Referral not needed for psychologist she was just contact anyone she wishes to see for counseling.

## 2024-06-15 ENCOUNTER — Ambulatory Visit (HOSPITAL_COMMUNITY)

## 2024-06-26 ENCOUNTER — Other Ambulatory Visit: Payer: Self-pay | Admitting: Internal Medicine

## 2024-06-27 ENCOUNTER — Ambulatory Visit: Payer: Medicare Other | Admitting: Internal Medicine

## 2024-06-30 ENCOUNTER — Ambulatory Visit: Payer: Self-pay

## 2024-06-30 NOTE — Telephone Encounter (Signed)
 No triage: Pt called to reschedule annual wellness appt. Pt had to cancel previous appt due to some type of reaction. Pt described pain as chest tearing. Pt denies this pain currently and hasn't experienced since. Pt transferred to CAL to schedule wellness appt.          Copied from CRM [id number]. Topic: Clinical - Red Word Triage >> Jun 30, 2024  3:32 PM Suzen RAMAN wrote: Red Word that prompted transfer to Nurse Triage: Allergic Reaction Reason for Disposition  Health information question, no triage required and triager able to answer question  Answer Assessment - Initial Assessment Questions 1. REASON FOR CALL: What is the main reason for your call? or How can I best help you?    Pt wanted to schedule annual wellness appt.  Protocols used: Information Only Call - No Triage-A-AH

## 2024-07-04 ENCOUNTER — Telehealth: Payer: Self-pay

## 2024-07-04 ENCOUNTER — Ambulatory Visit: Admitting: Family Medicine

## 2024-07-04 DIAGNOSIS — F200 Paranoid schizophrenia: Secondary | ICD-10-CM

## 2024-07-04 NOTE — Telephone Encounter (Signed)
 Copied from CRM [id number]. Topic: Referral - Question >> Jul 04, 2024  4:43 PM DeAngela L wrote: Reason for CRM: Pt calling to ask if her provider could give her a referral for  Pt num ([phone] BENNIE)  [facility] at [facility] [address],  [place], [place] [address] [phone]

## 2024-07-05 ENCOUNTER — Telehealth: Payer: Self-pay | Admitting: Internal Medicine

## 2024-07-05 NOTE — Telephone Encounter (Unsigned)
 Copied from CRM [id number]. Topic: Referral - Question >> Jul 05, 2024  2:27 PM Gennette ORN wrote: Reason for CRM: Patient is asking for a referral to be sent over to [facility] by fax or my chart.

## 2024-07-05 NOTE — Telephone Encounter (Signed)
 If she wants referral we need reason for referral

## 2024-07-05 NOTE — Telephone Encounter (Signed)
 Spoke with patient and she was told that she does need a referral. I asked her to call again and double check and if so to please give us  a call back so I can discuss with her provider in regards to this

## 2024-07-05 NOTE — Telephone Encounter (Signed)
 Referral not needed for counseling patient can just contact for counseling.

## 2024-07-05 NOTE — Telephone Encounter (Signed)
**Note De-identified  [place] Obfuscation** Please advise 

## 2024-07-06 ENCOUNTER — Ambulatory Visit (INDEPENDENT_AMBULATORY_CARE_PROVIDER_SITE_OTHER): Payer: Medicare Other

## 2024-07-06 VITALS — Ht 66.0 in | Wt 184.0 lb

## 2024-07-06 DIAGNOSIS — Z5982 Transportation insecurity: Secondary | ICD-10-CM | POA: Diagnosis not present

## 2024-07-06 DIAGNOSIS — Z0000 Encounter for general adult medical examination without abnormal findings: Secondary | ICD-10-CM | POA: Diagnosis not present

## 2024-07-06 DIAGNOSIS — Z124 Encounter for screening for malignant neoplasm of cervix: Secondary | ICD-10-CM | POA: Diagnosis not present

## 2024-07-06 DIAGNOSIS — Z1211 Encounter for screening for malignant neoplasm of colon: Secondary | ICD-10-CM | POA: Diagnosis not present

## 2024-07-06 DIAGNOSIS — Z1231 Encounter for screening mammogram for malignant neoplasm of breast: Secondary | ICD-10-CM | POA: Diagnosis not present

## 2024-07-06 NOTE — Progress Notes (Signed)
 Subjective:   Jackie Odonnell is a 63 y.o. who presents for a Medicare Wellness preventive visit.  As a reminder, Annual Wellness Visits don't include a physical exam, and some assessments may be limited, especially if this visit is performed virtually. We may recommend an in-person follow-up visit with your provider if needed.  Visit Complete: Virtual I connected with  Jackie Odonnell on 07/06/24 by a audio enabled telemedicine application and verified that I am speaking with the correct person using two identifiers.  Patient Location: Home  Provider Location: Office/Clinic  I discussed the limitations of evaluation and management by telemedicine. The patient expressed understanding and agreed to proceed.  Vital Signs: Because this visit was a virtual/telehealth visit, some criteria may be missing or patient reported. Any vitals not documented were not able to be obtained and vitals that have been documented are patient reported.  VideoDeclined- This patient declined Librarian, academic. Therefore the visit was completed with audio only.  Persons Participating in Visit: Patient.  AWV Questionnaire: No: Patient Medicare AWV questionnaire was not completed prior to this visit.  Cardiac Risk Factors include: advanced age (>67men, >67 women);hypertension     Objective:    Today's Vitals   07/06/24 1103  Weight: 184 lb (83.5 kg)  Height: 5' 6 (1.676 m)   Body mass index is 29.7 kg/m.     07/06/2024   11:35 AM 07/06/2023   11:34 AM 04/21/2022   12:04 PM 04/12/2021   12:36 PM 01/02/2020    2:47 PM 11/25/2018   12:52 PM 06/16/2017    1:20 PM  Advanced Directives  Does Patient Have a Medical Advance Directive? No No No No No No  No   Would patient like information on creating a medical advance directive? Yes (MAU/Ambulatory/Procedural Areas - Information given) No - Patient declined No - Patient declined No - Patient declined Yes (ED - Information  included in AVS) No - Patient declined  Yes (ED - Information included in AVS)      Data saved with a previous flowsheet row definition    Current Medications (verified) Outpatient Encounter Medications as of 07/06/2024  Medication Sig   acetaminophen  (TYLENOL ) 325 MG tablet Take 1 tablet (325 mg total) by mouth every 6 (six) hours as needed.   amLODipine  (NORVASC ) 10 MG tablet Take 1 tablet by mouth once daily   ARIPiprazole (ABILIFY) 20 MG tablet    benztropine  (COGENTIN ) 0.5 MG tablet Take 0.5 mg by mouth 2 (two) times daily.   busPIRone  (BUSPAR ) 10 MG tablet Take 10 mg by mouth 3 (three) times daily.    hydrOXYzine (ATARAX/VISTARIL) 25 MG tablet    lamoTRIgine  (LAMICTAL ) 100 MG tablet Take 100 mg by mouth 2 (two) times daily.   lisinopril  (ZESTRIL ) 40 MG tablet Take 1 tablet by mouth once daily   metoprolol  tartrate (LOPRESSOR ) 100 MG tablet Take 1 tablet (100 mg total) by mouth 2 (two) times daily.   Multiple Vitamins-Iron (MULTIVITAMINS WITH IRON) TABS tablet Take 1 tablet by mouth daily.   Potassium Chloride  ER 20 MEQ TBCR Take 1 tablet (20 mEq total) by mouth daily.   potassium chloride  SA (KLOR-CON  M) 20 MEQ tablet Take 1 tablet (20 mEq total) by mouth daily.   predniSONE  (DELTASONE ) 20 MG tablet Take 2 tablets (40 mg total) by mouth daily with breakfast.   No facility-administered encounter medications on file as of 07/06/2024.    Allergies (verified) Patient has no known allergies.   History:  Past Medical History:  Diagnosis Date   Anxiety    Bipolar affective (HCC)    Chemical Imbalance   Depression    Hypertension    Past Surgical History:  Procedure Laterality Date   ABDOMINAL HYSTERECTOMY     ECTOPIC PREGNANCY SURGERY     intestines removed     scar tissue issue   Family History  Problem Relation Age of Onset   Colon cancer Neg Hx    Esophageal cancer Neg Hx    Rectal cancer Neg Hx    Stomach cancer Neg Hx    Social History   Socioeconomic History    Marital status: Single    Spouse name: Not on file   Number of children: 0   Years of education: 15   Highest education level: Some college, no degree  Occupational History   Occupation: Disability    Comment: Schizophrenia  Tobacco Use   Smoking status: Every Day    Current packs/day: 2.00    Average packs/day: 2.0 packs/day for 42.5 years (85.1 ttl pk-yrs)    Types: Cigars, Cigarettes    Start date: 12/22/1981   Smokeless tobacco: Never   Tobacco comments:    07/06/23: Patient stated she has quit smoking cigarettes but now smokes 5 Black & Mild Cigars per day.        08/03/23, pt stated she does not smoke cigarettes but increase of Cigars   Vaping Use   Vaping status: Never Used  Substance and Sexual Activity   Alcohol use: No    Alcohol/week: 0.0 standard drinks of alcohol   Drug use: No   Sexual activity: Not Currently  Other Topics Concern   Not on file  Social History Narrative   Born and raised in [place], [place]. Currently resides in an apartment by herself. No pets. Fun: Watching tv and walking. Denies religious/spiritual beliefs effecting health care.    Social Drivers of Corporate investment banker Strain: Low Risk  (07/06/2024)   Overall Financial Resource Strain (CARDIA)    Difficulty of Paying Living Expenses: Not very hard  Food Insecurity: No Food Insecurity (07/06/2024)   Hunger Vital Sign    Worried About Running Out of Food in the Last Year: Never true    Ran Out of Food in the Last Year: Never true  Recent Concern: Food Insecurity - Food Insecurity Present (07/04/2024)   Hunger Vital Sign    Worried About Running Out of Food in the Last Year: Sometimes true    Ran Out of Food in the Last Year: Sometimes true  Transportation Needs: No Transportation Needs (07/06/2024)   PRAPARE - Administrator, Civil Service (Medical): No    Lack of Transportation (Non-Medical): No  Recent Concern: Transportation Needs - Unmet Transportation Needs (07/04/2024)    PRAPARE - Transportation    Lack of Transportation (Medical): Yes    Lack of Transportation (Non-Medical): Yes  Physical Activity: Insufficiently Active (07/06/2024)   Exercise Vital Sign    Days of Exercise per Week: 3 days    Minutes of Exercise per Session: 30 min  Stress: No Stress Concern Present (07/06/2024)   [employer] of Occupational Health - Occupational Stress Questionnaire    Feeling of Stress: Only a little  Social Connections: Moderately Integrated (07/06/2024)   Social Connection and Isolation Panel    Frequency of Communication with Friends and Family: More than three times a week    Frequency of Social Gatherings with Friends and Family: More  than three times a week    Attends Religious Services: 1 to 4 times per year    Active Member of Clubs or Organizations: Yes    Attends Banker Meetings: Never    Marital Status: Never married    Tobacco Counseling Ready to quit: Not Answered Counseling given: No Tobacco comments: 07/06/23: Patient stated she has quit smoking cigarettes but now smokes 5 Black & Mild Cigars per day.  08/03/23, pt stated she does not smoke cigarettes but increase of Cigars     Clinical Intake:  Pre-visit preparation completed: Yes  Pain : No/denies pain     BMI - recorded: 29.7 Nutritional Status: BMI 25 -29 Overweight Nutritional Risks: None Diabetes: No  Lab Results  Component Value Date   HGBA1C 5.7 01/26/2024   HGBA1C 6.0 08/03/2023   HGBA1C 6.0 12/26/2022     How often do you need to have someone help you when you read instructions, pamphlets, or other written materials from your doctor or pharmacy?: 1 - Never  Interpreter Needed?: No  Information entered by :: Verdie Saba, CMA   Activities of Daily Living     07/06/2024   11:34 AM  In your present state of health, do you have any difficulty performing the following activities:  Hearing? 0  Vision? 0  Difficulty concentrating or making decisions?  0  Walking or climbing stairs? 0  Dressing or bathing? 0  Doing errands, shopping? 0  Preparing Food and eating ? N  Using the Toilet? N  In the past six months, have you accidently leaked urine? N  Do you have problems with loss of bowel control? N  Managing your Medications? N  Managing your Finances? N  Housekeeping or managing your Housekeeping? N    Patient Care Team: Rollene Almarie LABOR, MD as PCP - General (Internal Medicine) Mengistu, Charlestine DASEN, RN as Registered Nurse [facility]) Linnell Devere BRAVO, MD as Consulting Physician (Obstetrics and Gynecology)  I have updated your Care Teams any recent Medical Services you may have received from other providers in the past year.     Assessment:   This is a routine wellness examination for [place].  Hearing/Vision screen Hearing Screening - Comments:: Denies hearing difficulties   Vision Screening - Comments:: Wears rx glasses - appt w/Dr Octavia in 2025   Goals Addressed               This Visit's Progress     Patient Stated (pt-stated)        Patient stated plans to join a gym and walk more/monitor your diet       Depression Screen     07/06/2024   11:09 AM 01/26/2024    3:43 PM 07/06/2023   11:57 AM 12/26/2022    3:57 PM 12/26/2022    3:50 PM 05/30/2022    2:53 PM 04/21/2022   11:58 AM  PHQ 2/9 Scores  PHQ - 2 Score 2 0 0 0 0 2 0  PHQ- 9 Score 4  1 0 0 11     Fall Risk     07/06/2024   11:08 AM 05/25/2024   10:18 AM 01/26/2024    3:43 PM 07/06/2023   11:35 AM 12/26/2022    3:50 PM  Fall Risk   Falls in the past year? 0 0 0 0 0  Number falls in past yr: 0 0 0 0 0  Injury with Fall? 0 0 0 0 0  Risk for fall due  to : No Fall Risks   No Fall Risks   Follow up Falls evaluation completed;Falls prevention discussed Falls evaluation completed Falls evaluation completed Falls prevention discussed Falls evaluation completed      Data saved with a previous flowsheet row definition    MEDICARE RISK AT HOME:  Medicare  Risk at Home Any stairs in or around the home?: Yes (outside) If so, are there any without handrails?: No Home free of loose throw rugs in walkways, pet beds, electrical cords, etc?: Yes Adequate lighting in your home to reduce risk of falls?: Yes Life alert?: No Use of a cane, walker or w/c?: No Grab bars in the bathroom?: No Shower chair or bench in shower?: No Elevated toilet seat or a handicapped toilet?: No  TIMED UP AND GO:  Was the test performed?  No  Cognitive Function: 6CIT completed        07/06/2024   11:33 AM 07/06/2023   12:00 PM 04/21/2022   12:04 PM  6CIT Screen  What Year? 0 points 0 points 0 points  What month? 0 points 0 points 0 points  What time? 0 points 0 points 0 points  Count back from 20 0 points 0 points 0 points  Months in reverse 0 points 0 points 0 points  Repeat phrase 0 points 0 points 0 points  Total Score 0 points 0 points 0 points    Immunizations Immunization History  Administered Date(s) Administered   Influenza, Seasonal, Injecte, Preservative Fre 01/26/2024   Influenza,inj,Quad PF,6+ Mos 11/07/2014, 10/04/2015, 11/25/2018, 08/27/2021   PFIZER(Purple Top)SARS-COV-2 Vaccination 04/28/2020, 05/19/2020   Td 09/22/1999, 04/25/2009    Screening Tests Health Maintenance  Topic Date Due   Pneumococcal Vaccine 13-78 Years old (1 of 2 - PCV) Never done   Cervical Cancer Screening (HPV/Pap Cotest)  Never done   Lung Cancer Screening  Never done   Zoster Vaccines- Shingrix (1 of 2) Never done   DTaP/Tdap/Td (3 - Tdap) 04/26/2019   MAMMOGRAM  01/28/2023   COVID-19 Vaccine (3 - 2024-25 season) 08/23/2023   Fecal DNA (Cologuard)  03/04/2024   INFLUENZA VACCINE  07/22/2024   Medicare Annual Wellness (AWV)  07/06/2025   Hepatitis C Screening  Completed   HIV Screening  Completed   Hepatitis B Vaccines  Aged Out   HPV VACCINES  Aged Out   Meningococcal B Vaccine  Aged Out    Health Maintenance  Health Maintenance Due  Topic Date Due    Pneumococcal Vaccine 87-45 Years old (1 of 2 - PCV) Never done   Cervical Cancer Screening (HPV/Pap Cotest)  Never done   Lung Cancer Screening  Never done   Zoster Vaccines- Shingrix (1 of 2) Never done   DTaP/Tdap/Td (3 - Tdap) 04/26/2019   MAMMOGRAM  01/28/2023   COVID-19 Vaccine (3 - 2024-25 season) 08/23/2023   Fecal DNA (Cologuard)  03/04/2024   Health Maintenance Items Addressed:  Cologuard Ordered, Referral sent to [facility] (smoker/hx smoking), by PCP.  Pt advised to call office to schedule.  Referral to Dr Devere Brave - for a pap smear and Mammogram.  Additional Screening:  Vision Screening: Recommended annual ophthalmology exams for early detection of glaucoma and other disorders of the eye. Would you like a referral to an eye doctor? No  Appt w/Dr Octavia for an eye exam in 2025.  Dental Screening: Recommended annual dental exams for proper oral hygiene  Community Resource Referral / Chronic Care Management: CRR required this visit?  No Pt is  already set up with Transportation services to medical appts.  CCM required this visit?  No   Plan:    I have personally reviewed and noted the following in the patient's chart:   Medical and social history Use of alcohol, tobacco or illicit drugs  Current medications and supplements including opioid prescriptions. Patient is not currently taking opioid prescriptions. Functional ability and status Nutritional status Physical activity Advanced directives List of other physicians Hospitalizations, surgeries, and ER visits in previous 12 months Vitals Screenings to include cognitive, depression, and falls Referrals and appointments  In addition, I have reviewed and discussed with patient certain preventive protocols, quality metrics, and best practice recommendations. A written personalized care plan for preventive services as well as general preventive health recommendations were provided to patient.   Verdie CHRISTELLA Saba, CMA   07/06/2024   After Visit Summary: (MyChart) Due to this being a telephonic visit, the after visit summary with patients personalized plan was offered to patient via MyChart   Notes: Nothing significant to report at this time.

## 2024-07-06 NOTE — Telephone Encounter (Signed)
 Got it, the prior message was not clear she wanted a new psychiatrist. Referral done
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
## 2024-07-06 NOTE — Patient Instructions (Addendum)
 Jackie Odonnell , Thank you for taking time out of your busy schedule to complete your Annual Wellness Visit with me. I enjoyed our conversation and look forward to speaking with you again next year. I, as well as your care team,  appreciate your ongoing commitment to your health goals. Please review the following plan we discussed and let me know if I can assist you in the future. Your Game plan/ To Do List    Referrals: If you haven't heard from the office you've been referred to, please reach out to them at the phone provided.  Cologuard Ordered, Referral sent to [facility] (smoker/hx smoking), by PCP.  Pt advised to call office to schedule.  Referral to Dr Devere Brave - for a pap smear and Mammogram. Follow up Visits: Next Medicare AWV with our clinical staff: 07/13/2025   Have you seen your provider in the last 6 months (3 months if uncontrolled diabetes)? Yes Next Office Visit with your provider: 07/29/2024 - 6 month follow up  Clinician Recommendations:  Aim for 30 minutes of exercise or brisk walking, 6-8 glasses of water, and 5 servings of fruits and vegetables each day. Educated and advised on getting the Pneumonia and Tdap (Tetenus) vaccines at [facility].      This is a list of the screening recommended for you and due dates:  Health Maintenance  Topic Date Due   Pneumococcal Vaccination (1 of 2 - PCV) Never done   Pap with HPV screening  Never done   Screening for Lung Cancer  Never done   Zoster (Shingles) Vaccine (1 of 2) Never done   DTaP/Tdap/Td vaccine (3 - Tdap) 04/26/2019   Mammogram  01/28/2023   COVID-19 Vaccine (3 - 2024-25 season) 08/23/2023   Cologuard (Stool DNA test)  03/04/2024   Flu Shot  07/22/2024   Medicare Annual Wellness Visit  07/06/2025   Hepatitis C Screening  Completed   HIV Screening  Completed   Hepatitis B Vaccine  Aged Out   HPV Vaccine  Aged Out   Meningitis B Vaccine  Aged Out    Advanced directives: (Provided) Advance directive  discussed with you today. I have provided a copy for you to complete at home and have notarized. Once this is complete, please bring a copy in to our office so we can scan it into your chart.  Advance Care Planning is important because it:  [x]  Makes sure you receive the medical care that is consistent with your values, goals, and preferences  [x]  It provides guidance to your family and loved ones and reduces their decisional burden about whether or not they are making the right decisions based on your wishes.  Follow the link provided in your after visit summary or read over the paperwork we have mailed to you to help you started getting your Advance Directives in place. If you need assistance in completing these, please reach out to us  so that we can help you!

## 2024-07-07 NOTE — Telephone Encounter (Signed)
 This has already been done yesterday

## 2024-07-08 ENCOUNTER — Encounter: Payer: Self-pay | Admitting: Family Medicine

## 2024-07-08 ENCOUNTER — Ambulatory Visit (INDEPENDENT_AMBULATORY_CARE_PROVIDER_SITE_OTHER): Admitting: Family Medicine

## 2024-07-08 VITALS — BP 130/78 | HR 76 | Temp 98.4°F | Ht 66.0 in | Wt 191.4 lb

## 2024-07-08 DIAGNOSIS — J410 Simple chronic bronchitis: Secondary | ICD-10-CM | POA: Diagnosis not present

## 2024-07-08 DIAGNOSIS — R079 Chest pain, unspecified: Secondary | ICD-10-CM | POA: Diagnosis not present

## 2024-07-08 DIAGNOSIS — I10 Essential (primary) hypertension: Secondary | ICD-10-CM | POA: Diagnosis not present

## 2024-07-08 DIAGNOSIS — K449 Diaphragmatic hernia without obstruction or gangrene: Secondary | ICD-10-CM

## 2024-07-08 DIAGNOSIS — R600 Localized edema: Secondary | ICD-10-CM

## 2024-07-08 MED ORDER — FAMOTIDINE 20 MG PO TABS: 20.0000 mg | ORAL_TABLET | Freq: Two times a day (BID) | ORAL | 1 refills | Status: AC

## 2024-07-08 NOTE — Patient Instructions (Signed)
 EKG looks good today.  Discussed water intake and use of compression socks.  Continue current medication regimen.  Follow up with specialists as scheduled.

## 2024-07-08 NOTE — Progress Notes (Signed)
 Acute Office Visit  Subjective:     Patient ID: Jackie Odonnell, female    DOB: 1961-03-21, 63 y.o.   MRN: [id number]  Chief Complaint  Patient presents with   Acute Visit    HPI  Discussed the use of AI scribe software for clinical note transcription with the patient, who gave verbal consent to proceed.  History of Present Illness Jackie Odonnell is a 63 year old female who presents with concerns about a hernia and intermittent ankle swelling.  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 - No radiating pain - Consumption of popcorn exacerbates the pain - No use of specific medication for hernia pain  Peripheral edema - Intermittent ankle swelling - Swelling is associated with consumption of cheap coffee and increased sodium intake - Worsening of swelling with heat exposure and prolonged sitting with feet dependent - Maintains hydration by drinking water throughout the day - Recent increase in sodium consumption     ROS Per HPI      Objective:    BP 130/78 (BP Location: Left Arm, Patient Position: Sitting)   Pulse 76   Temp 98.4 F (36.9 C) (Temporal)   Ht 5' 6 (1.676 m)   Wt 191 lb 6.4 oz (86.8 kg)   SpO2 97%   BMI 30.89 kg/m    Physical Exam Vitals and nursing note reviewed.  Constitutional:      General: She is not in acute distress.    Appearance: Normal appearance. She is normal weight.  HENT:     Head: Normocephalic and atraumatic.     Right Ear: External ear normal.     Left Ear: External ear normal.     Nose: Nose normal.     Mouth/Throat:     Mouth: Mucous membranes are moist.     Pharynx: Oropharynx is clear.  Eyes:     Extraocular Movements: Extraocular movements intact.     Pupils: Pupils are equal, round, and reactive to light.  Cardiovascular:     Rate and Rhythm: Normal rate and regular rhythm.     Pulses: Normal pulses.      Heart sounds: Normal heart sounds.  Pulmonary:     Effort: Pulmonary effort is normal. No respiratory distress.     Breath sounds: Normal breath sounds. No wheezing, rhonchi or rales.  Musculoskeletal:        General: Normal range of motion.     Cervical back: Normal range of motion.     Right lower leg: Edema present.     Left lower leg: Edema present.     Comments: 1+ non pitting bilaterally  Lymphadenopathy:     Cervical: No cervical adenopathy.  Neurological:     General: No focal deficit present.     Mental Status: She is alert and oriented to person, place, and time.  Psychiatric:        Mood and Affect: Mood normal.        Thought Content: Thought content normal.   EKG: Indication: Chest pain Rate: 73 Interpretation: NSR Changes from previous: none   No results found for any visits on 07/08/24.      Assessment & Plan:   Assessment and Plan Assessment & Plan Chest Pain Likely r/t hernia Hernia likely related to previous lifting work, with pain exacerbated by certain foods and possible reflux contribution. - Prescribed Pepcid with breakfast and dinner or  just dinner for reflux management.  Intermittent Ankle Edema Occasional ankle swelling linked to high sodium intake and heat. - Advised reducing sodium intake. - Consider compression stockings if swelling persists, especially in hot weather.   Orders Placed This Encounter  Procedures   EKG 12-Lead     Meds ordered this encounter  Medications   famotidine (PEPCID) 20 MG tablet    Sig: Take 1 tablet (20 mg total) by mouth 2 (two) times daily.    Dispense:  60 tablet    Refill:  1    Return if symptoms worsen or fail to improve.  Corean LITTIE Ku, FNP

## 2024-07-14 ENCOUNTER — Telehealth: Payer: Self-pay | Admitting: Internal Medicine

## 2024-07-14 NOTE — Telephone Encounter (Signed)
 Copied from CRM [id number]. Topic: General - Other >> Jul 13, 2024  1:49 PM Paige D wrote: Reason for CRM: Pt calling in regards to some one reaching out to her via my chart . Possibly due to referral. PT would like a call back when they have a chance pt is also asking for some one to reach out in regards to her EKG.

## 2024-07-14 NOTE — Telephone Encounter (Signed)
 Im assuming stephanie already went over the results with her I'm not sure why she would need call back on her EKG

## 2024-07-14 NOTE — Telephone Encounter (Signed)
 LVM for patient to return call.

## 2024-07-14 NOTE — Telephone Encounter (Signed)
 Copied from CRM [id number]. Topic: General - Other >> Jul 14, 2024  3:03 PM Franky GRADE wrote: Reason for CRM: Patient is returning a call she received from [place], [place] she was with a patient. Asked to send CRM.

## 2024-07-20 ENCOUNTER — Ambulatory Visit (HOSPITAL_COMMUNITY)
Admission: RE | Admit: 2024-07-20 | Discharge: 2024-07-20 | Disposition: A | Discharge status: Home / Self Care | Source: Ambulatory Visit | Attending: Acute Care | Admitting: Acute Care

## 2024-07-20 DIAGNOSIS — F17210 Nicotine dependence, cigarettes, uncomplicated: Secondary | ICD-10-CM | POA: Diagnosis not present

## 2024-07-20 DIAGNOSIS — Z122 Encounter for screening for malignant neoplasm of respiratory organs: Secondary | ICD-10-CM | POA: Diagnosis not present

## 2024-07-20 DIAGNOSIS — Z87891 Personal history of nicotine dependence: Secondary | ICD-10-CM | POA: Insufficient documentation

## 2024-07-27 NOTE — Telephone Encounter (Signed)
 Copied from CRM #[id number]. Topic: General - Other >> Jul 27, 2024  9:12 AM Deaijah H wrote: Reason for CRM: Patient would like Dr. Rollene nurse to give a call.

## 2024-07-29 ENCOUNTER — Ambulatory Visit: Admitting: Internal Medicine

## 2024-07-29 NOTE — Telephone Encounter (Signed)
 Called patient and informed her to give her pulmonary doctor in regards to her lung test

## 2024-08-01 ENCOUNTER — Telehealth: Payer: Self-pay | Admitting: *Deleted

## 2024-08-01 ENCOUNTER — Telehealth: Payer: Self-pay | Admitting: Acute Care

## 2024-08-01 DIAGNOSIS — R911 Solitary pulmonary nodule: Secondary | ICD-10-CM

## 2024-08-01 DIAGNOSIS — Z87891 Personal history of nicotine dependence: Secondary | ICD-10-CM

## 2024-08-01 NOTE — Telephone Encounter (Signed)
 Spoke with patient and reviewed lung screening Ct results. Small lung nodules noted that we would like to look at again in 6 months with a repeat CT scan. Patient verbalized understanding and is aware we will contact her closer to 6 months to schedule. Results/ plans faxed to PCP. Order placed for 6 month nodule f/u CT.

## 2024-08-01 NOTE — Telephone Encounter (Signed)
 Called patient to review results of LDCT.  She states she would like a call back later that she hasn't even washed her face yet.  Offered our number to give us  a call when she is ready to discuss but patient declined our number.

## 2024-08-08 ENCOUNTER — Encounter: Payer: Self-pay | Admitting: Internal Medicine

## 2024-08-08 ENCOUNTER — Ambulatory Visit: Admitting: Internal Medicine

## 2024-08-08 VITALS — BP 124/82 | HR 127 | Temp 98.4°F | Ht 66.0 in | Wt 198.0 lb

## 2024-08-08 DIAGNOSIS — R42 Dizziness and giddiness: Secondary | ICD-10-CM | POA: Diagnosis not present

## 2024-08-08 DIAGNOSIS — I10 Essential (primary) hypertension: Secondary | ICD-10-CM

## 2024-08-08 DIAGNOSIS — M542 Cervicalgia: Secondary | ICD-10-CM

## 2024-08-08 DIAGNOSIS — R7303 Prediabetes: Secondary | ICD-10-CM

## 2024-08-08 DIAGNOSIS — Z23 Encounter for immunization: Secondary | ICD-10-CM

## 2024-08-08 DIAGNOSIS — R718 Other abnormality of red blood cells: Secondary | ICD-10-CM

## 2024-08-08 DIAGNOSIS — J410 Simple chronic bronchitis: Secondary | ICD-10-CM | POA: Diagnosis not present

## 2024-08-08 LAB — COMPREHENSIVE METABOLIC PANEL WITH GFR
ALT: 18 U/L (ref 0–35)
AST: 14 U/L (ref 0–37)
Albumin: 4.2 g/dL (ref 3.5–5.2)
Alkaline Phosphatase: 85 U/L (ref 39–117)
BUN: 9 mg/dL (ref 6–23)
CO2: 28 meq/L (ref 19–32)
Calcium: 9 mg/dL (ref 8.4–10.5)
Chloride: 103 meq/L (ref 96–112)
Creatinine, Ser: 0.8 mg/dL (ref 0.40–1.20)
GFR: 78.67 mL/min (ref >60.00)
Glucose, Bld: 80 mg/dL (ref 70–99)
Potassium: 4.1 meq/L (ref 3.5–5.1)
Sodium: 141 meq/L (ref 135–145)
Total Bilirubin: 0.4 mg/dL (ref 0.2–1.2)
Total Protein: 6.7 g/dL (ref 6.0–8.3)

## 2024-08-08 LAB — CBC
HCT: 41.8 % (ref 36.0–46.0)
Hemoglobin: 13.1 g/dL (ref 12.0–15.0)
MCHC: 31.3 g/dL (ref 30.0–36.0)
MCV: 72.5 fl — ABNORMAL LOW (ref 78.0–100.0)
Platelets: 249 K/uL (ref 150.0–400.0)
RBC: 5.77 Mil/uL — ABNORMAL HIGH (ref 3.87–5.11)
RDW: 16.4 % — ABNORMAL HIGH (ref 11.5–15.5)
WBC: 9.3 K/uL (ref 4.0–10.5)

## 2024-08-08 LAB — LIPID PANEL
Cholesterol: 128 mg/dL (ref 0–200)
HDL: 71 mg/dL (ref >39.00)
LDL Cholesterol: 45 mg/dL (ref 0–99)
NonHDL: 57.42
Total CHOL/HDL Ratio: 2
Triglycerides: 63 mg/dL (ref 0.0–149.0)
VLDL: 12.6 mg/dL (ref 0.0–40.0)

## 2024-08-08 LAB — HEMOGLOBIN A1C: Hgb A1c MFr Bld: 6.4 % (ref 4.6–6.5)

## 2024-08-08 NOTE — Progress Notes (Signed)
 Subjective:   Patient ID: Jackie Odonnell, female    DOB: 1961/10/21, 63 y.o.   MRN: [id number]  Discussed the use of AI scribe software for clinical note transcription with the patient, who gave verbal consent to proceed.  History of Present Illness Jackie Odonnell is a 63 year old female with emphysema who presents with neck and head pain.  She has been experiencing intermittent neck and head pain since last Wednesday, which worsens upon waking and sometimes restricts neck movement. The pain radiates from the neck to the head and is exacerbated by certain movements, particularly side-to-side and leaning back. Tylenol  provides minimal relief.  She experiences episodes of lightheadedness and feeling faint, notably during a recent trip to [place] where she felt as though she might pass out. She associates this with a potential iron deficiency and is due for blood work.  She has a history of emphysema and experiences a chronic cough. She is attempting to reduce smoking. No significant breathing difficulties but acknowledges a persistent cough and congestion.  She mentions experiencing diarrhea, which she attributes to missing doses of her medication. She is in the process of completing a colon cancer screening kit.  Her social history includes efforts to quit smoking and challenges in finding affordable housing. She has not yet seen her new psychiatrist but has an appointment scheduled for the end of the month. PMH, [facility], social history reviewed and updated  Review of Systems  Constitutional:  Positive for activity change and fatigue.  HENT: Negative.    Eyes: Negative.   Respiratory:  Negative for cough, chest tightness and shortness of breath.   Cardiovascular:  Negative for chest pain, palpitations and leg swelling.  Gastrointestinal:  Positive for diarrhea. Negative for abdominal distention, abdominal pain, constipation, nausea and vomiting.  Musculoskeletal:  Positive for neck pain.   Skin: Negative.   Neurological:  Positive for light-headedness.  Psychiatric/Behavioral: Negative.      Objective:  Physical Exam Constitutional:      Appearance: She is well-developed.  HENT:     Head: Normocephalic and atraumatic.  Cardiovascular:     Rate and Rhythm: Normal rate and regular rhythm.  Pulmonary:     Effort: Pulmonary effort is normal. No respiratory distress.     Breath sounds: Normal breath sounds. No wheezing or rales.  Abdominal:     General: Bowel sounds are normal. There is no distension.     Palpations: Abdomen is soft.     Tenderness: There is no abdominal tenderness. There is no rebound.  Musculoskeletal:        General: Tenderness present.     Cervical back: Normal range of motion.  Skin:    General: Skin is warm and dry.  Neurological:     Mental Status: She is alert and oriented to person, place, and time.     Coordination: Coordination normal.     Vitals:   08/08/24 1459  BP: 124/82  Pulse: (!) 127  Temp: 98.4 F (36.9 C)  TempSrc: Oral  SpO2: 97%  Weight: 198 lb (89.8 kg)  Height: 5' 6 (1.676 m)   Prevnar 20 given at visit  Assessment and Plan Assessment & Plan Chronic neck pain with muscle spasm   Intermittent neck pain with muscle spasm is likely due to poor sleeping posture, with pain radiating from the neck to the base of the skull and worsening with certain movements. Continue acetaminophen  for pain management and advise on proper sleeping posture to alleviate symptoms.  Lightheadedness   Intermittent lightheadedness may be related to anemia.  Emphysema due to smoking   Moderate emphysema is evident on imaging, attributed to smoking. Smoking cessation was discussed to prevent progression to severe lung disease and breathing problems. Cessation normalizes lung aging rates, preventing further deterioration. Administer the pneumonia vaccine and encourage smoking cessation to prevent further lung damage.  Chronic cough    Chronic cough is likely related to emphysema and smoking, with potential for chronic bronchitis due to smoking. Quitting smoking can prevent progression to more severe respiratory issues. Encourage smoking cessation to reduce cough symptoms.  Chronic diarrhea   Chronic diarrhea may be related to missed doses of daily medication.

## 2024-08-08 NOTE — Patient Instructions (Signed)
 We will check the labs today.
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
## 2024-08-10 ENCOUNTER — Ambulatory Visit: Payer: Self-pay | Admitting: Internal Medicine

## 2024-08-10 DIAGNOSIS — R7303 Prediabetes: Secondary | ICD-10-CM

## 2024-08-10 DIAGNOSIS — Z1231 Encounter for screening mammogram for malignant neoplasm of breast: Secondary | ICD-10-CM | POA: Diagnosis not present

## 2024-08-10 LAB — HM MAMMOGRAPHY

## 2024-08-11 ENCOUNTER — Encounter: Payer: Self-pay | Admitting: Obstetrics and Gynecology

## 2024-08-11 DIAGNOSIS — R42 Dizziness and giddiness: Secondary | ICD-10-CM | POA: Insufficient documentation

## 2024-08-11 DIAGNOSIS — M542 Cervicalgia: Secondary | ICD-10-CM | POA: Insufficient documentation

## 2024-08-11 NOTE — Assessment & Plan Note (Addendum)
 Suspect musculoskeletal and advised to use tylenol  up to 3000 mg total daily dosing.

## 2024-08-11 NOTE — Assessment & Plan Note (Signed)
 Checking CBC and adjust as needed.

## 2024-08-11 NOTE — Assessment & Plan Note (Signed)
 Checking CMP and BP at goal on amlodipine  10 mg daily and lisinopril  40 mg daily and metoprolol  100 mg BID.

## 2024-08-11 NOTE — Assessment & Plan Note (Signed)
 Checking CBC and CMP for cause.

## 2024-08-11 NOTE — Assessment & Plan Note (Signed)
 Counseled to stop smoking and counseled about the cause of her chronic cough.

## 2024-08-11 NOTE — Assessment & Plan Note (Signed)
 Checking HgA1c and adjust as needed.

## 2024-08-12 NOTE — Progress Notes (Signed)
 Patient has been scheduled for a follow up

## 2024-08-17 ENCOUNTER — Ambulatory Visit (HOSPITAL_BASED_OUTPATIENT_CLINIC_OR_DEPARTMENT_OTHER): Payer: Self-pay | Admitting: Psychiatry

## 2024-08-17 VITALS — BP 136/89 | Ht 66.0 in | Wt 195.6 lb

## 2024-08-17 DIAGNOSIS — F411 Generalized anxiety disorder: Secondary | ICD-10-CM | POA: Diagnosis not present

## 2024-08-17 DIAGNOSIS — F200 Paranoid schizophrenia: Secondary | ICD-10-CM

## 2024-08-17 MED ORDER — BUSPIRONE HCL 10 MG PO TABS: 10.0000 mg | ORAL_TABLET | Freq: Three times a day (TID) | ORAL | 1 refills | Status: DC

## 2024-08-17 MED ORDER — LAMOTRIGINE 25 MG PO TABS: 50.0000 mg | ORAL_TABLET | Freq: Every day | ORAL | 1 refills | Status: DC

## 2024-08-17 MED ORDER — ARIPIPRAZOLE 30 MG PO TABS: 30.0000 mg | ORAL_TABLET | Freq: Every day | ORAL | 0 refills | Status: DC

## 2024-08-17 MED ORDER — HYDROXYZINE HCL 25 MG PO TABS: 25.0000 mg | ORAL_TABLET | Freq: Three times a day (TID) | ORAL | 0 refills | Status: DC | PRN

## 2024-08-17 NOTE — Addendum Note (Signed)
 Addended by: CARVIN CROCK on: 08/17/2024 02:55 PM   Modules accepted: Level of Service

## 2024-08-27 ENCOUNTER — Other Ambulatory Visit: Payer: Self-pay | Admitting: Internal Medicine

## 2024-08-31 ENCOUNTER — Telehealth: Payer: Self-pay

## 2024-08-31 NOTE — Telephone Encounter (Signed)
Please advise as MD is out of office

## 2024-08-31 NOTE — Telephone Encounter (Signed)
 Copied from CRM #[id number]. Topic: Clinical - Medication Question >> Aug 31, 2024  3:25 PM Mia F wrote: Reason for CRM: Pt has questions about what to do prior to taking the cologaurd. She plans to do the test on Friday and wold like some info of what he do's and don'ts are prior to taking. Please advise

## 2024-08-31 NOTE — Telephone Encounter (Signed)
 No preparation is needed.  She does not need to change her diet or stop any medications.  She just needs to provide a stool sample and mail it back when she is complete.

## 2024-09-01 NOTE — Telephone Encounter (Signed)
 Called patient back and notified her about this LVM

## 2024-09-09 ENCOUNTER — Ambulatory Visit: Payer: Self-pay | Admitting: Family

## 2024-09-09 LAB — COLOGUARD: COLOGUARD: NEGATIVE

## 2024-09-19 NOTE — Progress Notes (Signed)
 Psychiatric Adult Assessment Progress Note  Patient Identification: Jackie Odonnell MRN:  [id number] Date of Evaluation:  09/19/2024  Assessment: Patient presents for a follow up evaluation. In the prior visit, we continued her Abilify , Lamictal , BuSpar  and hydroxyzine . Today, patient is doing well, able to voice her medications are aiding with mood stabilization and auditory hallucinations. She does continue to note fear regarding people moving her locks at night but reassuringly she has support and most of her days are spent with her mother. She also is trying to move houses and states that once she is out of the current living environment that her paranoia will greatly improve. No medication changes today, f/u in 1.5 months.  Plan:  # Schizoaffective disorder, depressive type - Continue Abilify  30 mg daily - Labs reviewed from 07/2024: CBC and CMP unremarkable, A1c, and Lipid panel WNL, TSH from 01/2024 WNL, EKG 06/2024 Qtc 438  -AIMS exam normal 08/17/2024 -Continue Lamictal  50 mg daily -Continue BuSpar  10 mg TID -Continue Atarax  25 mg TID PRN -Provided therapy referral sheet  # Tobacco use disorder - encourage cessation   Patient was given contact information for behavioral health clinic and was instructed to call 911 for emergencies.   Identifying Information: Jackie Odonnell is a 63 y.o. female with a history of schizophrenia who presents in person to [facility] for establishment of care.    Subjective:  History of Present Illness:   Patient seen alone.  Patient reports feeling okay today. Since the previous visit, she still feels there are people are trying to access her home since COVID and she is wondering why the apartment managers have not done something about the issue. She states that she hears someone moving her locks every morning. She installed a new lock last year and she is planning on getting a new lock when she gets paid. She states that she  plans on installing a new camera. She is trying to figure out where she can move apartments and state that when she moves then she will feel better. She states that she calls security when she feels worried and this helps her stay calm. She states taking hydroxyzine  a few times a week and she states it helps with she needs to stay calm. She states most of her days, she spends time with her mother.   Regarding psychiatric symptoms, she notes her symptoms are about the same as before. Patient reports the medications are helpful specifically with paranoia and AVH. Patient reports the following adverse effects: denies.   Patient reports fair sleep, reporting 6 hours of sleep. Patient reports good appetite.   Patient denies current SI, HI, and AVH.   Past Psychiatric History:  Diagnoses: schizophrenia (dx in 1985) Previous medications: haldol ,cogentin  Previous psychiatrist: yes, previously saw Olam Ricker last 09/2023 Previous therapist: denies  Hospitalizations: yes, once in 2009 due to mother stated pt wanted to harm herself Suicide attempts: denies SIB: denies Current access to guns: denies  Hx of violence towards others: denies  Hx of trauma/abuse: denies  Substance use:  Tobacco: yes, 1 pack every 2-3 days started in college Alcohol: socially, 2 beers on holidays Marijuana: denies current, most recently in 1980s occasionally  Other illicit substances: denies  Family Psychiatric History: cousin-bipolar schizophrenic  Social History:  Living: live alone in [place] in apartment for 15 years Occupation: disability for 12 years Relationship: single Children: none Support: sister Legal History: none  Past Medical History:  Past Medical History:  Diagnosis Date  Anxiety    Bipolar affective (HCC)    Chemical Imbalance   Depression    Hypertension     Past Surgical History:  Procedure Laterality Date   ABDOMINAL HYSTERECTOMY     ECTOPIC PREGNANCY SURGERY     intestines removed      scar tissue issue    Family History:  Family History  Problem Relation Age of Onset   Colon cancer Neg Hx    Esophageal cancer Neg Hx    Rectal cancer Neg Hx    Stomach cancer Neg Hx     Social History   Socioeconomic History   Marital status: Single    Spouse name: Not on file   Number of children: 0   Years of education: 15   Highest education level: Some college, no degree  Occupational History   Occupation: Disability    Comment: Schizophrenia  Tobacco Use   Smoking status: Every Day    Current packs/day: 2.00    Average packs/day: 2.0 packs/day for 42.7 years (85.5 ttl pk-yrs)    Types: Cigars, Cigarettes    Start date: 12/22/1981   Smokeless tobacco: Never   Tobacco comments:    07/06/23: Patient stated she has quit smoking cigarettes but now smokes 5 Black & Mild Cigars per day.        08/03/23, pt stated she does not smoke cigarettes but increase of Cigars   Vaping Use   Vaping status: Never Used  Substance and Sexual Activity   Alcohol use: No    Alcohol/week: 0.0 standard drinks of alcohol   Drug use: No   Sexual activity: Not Currently  Other Topics Concern   Not on file  Social History Narrative   Born and raised in [place], [place]. Currently resides in an apartment by herself. No pets. Fun: Watching tv and walking. Denies religious/spiritual beliefs effecting health care.    Social Drivers of Corporate investment banker Strain: Low Risk  (07/06/2024)   Overall Financial Resource Strain (CARDIA)    Difficulty of Paying Living Expenses: Not very hard  Food Insecurity: No Food Insecurity (07/06/2024)   Hunger Vital Sign    Worried About Running Out of Food in the Last Year: Never true    Ran Out of Food in the Last Year: Never true  Recent Concern: Food Insecurity - Food Insecurity Present (07/04/2024)   Hunger Vital Sign    Worried About Running Out of Food in the Last Year: Sometimes true    Ran Out of Food in the Last Year: Sometimes true   Transportation Needs: No Transportation Needs (07/06/2024)   PRAPARE - Administrator, Civil Service (Medical): No    Lack of Transportation (Non-Medical): No  Recent Concern: Transportation Needs - Unmet Transportation Needs (07/04/2024)   PRAPARE - Transportation    Lack of Transportation (Medical): Yes    Lack of Transportation (Non-Medical): Yes  Physical Activity: Insufficiently Active (07/06/2024)   Exercise Vital Sign    Days of Exercise per Week: 3 days    Minutes of Exercise per Session: 30 min  Stress: No Stress Concern Present (07/06/2024)   [employer] of Occupational Health - Occupational Stress Questionnaire    Feeling of Stress: Only a little  Social Connections: Moderately Integrated (07/06/2024)   Social Connection and Isolation Panel    Frequency of Communication with Friends and Family: More than three times a week    Frequency of Social Gatherings with Friends and Family: More than three  times a week    Attends Religious Services: 1 to 4 times per year    Active Member of Clubs or Organizations: Yes    Attends Banker Meetings: Never    Marital Status: Never married    Allergies: No Known Allergies  Current Medications: Current Outpatient Medications  Medication Sig Dispense Refill   acetaminophen  (TYLENOL ) 325 MG tablet Take 1 tablet (325 mg total) by mouth every 6 (six) hours as needed. 270 tablet 11   amLODipine  (NORVASC ) 10 MG tablet Take 1 tablet by mouth once daily 90 tablet 0   ARIPiprazole  (ABILIFY ) 30 MG tablet Take 1 tablet (30 mg total) by mouth daily. 60 tablet 0   busPIRone  (BUSPAR ) 10 MG tablet Take 1 tablet (10 mg total) by mouth 3 (three) times daily. 90 tablet 1   famotidine  (PEPCID ) 20 MG tablet Take 1 tablet (20 mg total) by mouth 2 (two) times daily. 60 tablet 1   hydrOXYzine  (ATARAX ) 25 MG tablet Take 1 tablet (25 mg total) by mouth every 8 (eight) hours as needed for anxiety. 60 tablet 0   lamoTRIgine  (LAMICTAL )  25 MG tablet Take 2 tablets (50 mg total) by mouth daily. 60 tablet 1   lisinopril  (ZESTRIL ) 40 MG tablet Take 1 tablet by mouth once daily 90 tablet 0   metoprolol  tartrate (LOPRESSOR ) 100 MG tablet Take 1 tablet (100 mg total) by mouth 2 (two) times daily. 180 tablet 3   Multiple Vitamins-Iron (MULTIVITAMINS WITH IRON) TABS tablet Take 1 tablet by mouth daily.     Potassium Chloride  ER 20 MEQ TBCR Take 1 tablet (20 mEq total) by mouth daily. 90 tablet 3   potassium chloride  SA (KLOR-CON  M) 20 MEQ tablet Take 1 tablet (20 mEq total) by mouth daily. 60 tablet 1   predniSONE  (DELTASONE ) 20 MG tablet Take 2 tablets (40 mg total) by mouth daily with breakfast. 10 tablet 0   No current facility-administered medications for this visit.    Objective: Psychiatric Specialty Exam: General Appearance: appears at stated age, casually dressed and groomed   Behavior: pleasant and cooperative   Psychomotor Activity: no psychomotor agitation or retardation noted   Eye Contact: fair  Speech: normal amount, volume and fluency    Mood: euthymic  Affect: congruent, pleasant and interactive   Thought Process: linear, goal directed, no circumstantial or tangential thought process noted, no racing thoughts or flight of ideas  Descriptions of Associations: intact   Thought Content Hallucinations: denies AH, VH , does not appear responding to stimuli  Delusions: paranoia present; no delusions of control, grandeur, ideas of reference, thought broadcasting, and magical thinking  Suicidal Thoughts: denies SI, intention, plan  Homicidal Thoughts: denies HI, intention, plan   Alertness/Orientation: alert and fully oriented   Insight: fair Judgment: fair  Memory: intact   Executive Functions  Concentration: intact  Attention Span: fair  Recall: intact  Fund of Knowledge: fair   Physical Exam  General: Pleasant, well-appearing . No acute distress. Pulmonary: Normal effort. No wheezing or  rales. Skin: No obvious rash or lesions. Neuro: A&Ox3.No focal deficit.  Review of Systems  No reported symptoms   Metabolic Disorder Labs: Lab Results  Component Value Date   HGBA1C 6.4 08/08/2024   MPG 123 (H) 08/06/2012   No results found for: PROLACTIN Lab Results  Component Value Date   CHOL 128 08/08/2024   TRIG 63.0 08/08/2024   HDL 71.00 08/08/2024   CHOLHDL 2 08/08/2024   VLDL 12.6 08/08/2024  LDLCALC 45 08/08/2024   LDLCALC 56 08/03/2023   Lab Results  Component Value Date   TSH 2.25 01/26/2024    Therapeutic Level Labs: No results found for: LITHIUM No results found for: CBMZ No results found for: VALPROATE  Screenings:  PHQ2-9    Flowsheet Row Office Visit from 08/08/2024 in [facility] [place] [facility] Clinical Support from 07/06/2024 in [facility] [place] Office Visit from 01/26/2024 in [facility] [place] [facility] Clinical Support from 07/06/2023 in [facility] Visit from 12/26/2022 in [facility]  PHQ-2 Total Score 1 2 0 0 0  PHQ-9 Total Score 7 4 -- 1 0    Collaboration of Care: Case discussed with attending, see attending's attestation for additional information.  Ismael Franco, MD PGY-3 Psychiatry Resident

## 2024-09-26 ENCOUNTER — Ambulatory Visit (HOSPITAL_BASED_OUTPATIENT_CLINIC_OR_DEPARTMENT_OTHER): Admitting: Psychiatry

## 2024-09-26 DIAGNOSIS — F411 Generalized anxiety disorder: Secondary | ICD-10-CM

## 2024-09-26 DIAGNOSIS — F200 Paranoid schizophrenia: Secondary | ICD-10-CM

## 2024-09-26 MED ORDER — ARIPIPRAZOLE 30 MG PO TABS: 30.0000 mg | ORAL_TABLET | Freq: Every day | ORAL | 0 refills | Status: DC

## 2024-09-26 MED ORDER — LAMOTRIGINE 25 MG PO TABS: 50.0000 mg | ORAL_TABLET | Freq: Every day | ORAL | 0 refills | Status: DC

## 2024-09-26 MED ORDER — HYDROXYZINE HCL 25 MG PO TABS: 25.0000 mg | ORAL_TABLET | Freq: Three times a day (TID) | ORAL | 0 refills | Status: AC | PRN

## 2024-09-26 MED ORDER — BUSPIRONE HCL 10 MG PO TABS: 10.0000 mg | ORAL_TABLET | Freq: Three times a day (TID) | ORAL | 1 refills | Status: AC

## 2024-10-07 ENCOUNTER — Other Ambulatory Visit: Payer: Self-pay | Admitting: Internal Medicine

## 2024-10-31 NOTE — Progress Notes (Deleted)
 Psychiatric Adult Assessment Progress Note  Patient Identification: Jackie Odonnell MRN:  [id number] Date of Evaluation:  10/31/2024  Assessment: Patient presents for a follow up evaluation. In the prior visit, her psychotropic medications were maintained.   Today, ***  Plan:  # Schizoaffective disorder, depressive type - Continue Abilify  30 mg daily - Labs reviewed from 07/2024: CBC and CMP unremarkable, A1c, and Lipid panel WNL, TSH from 01/2024 WNL, EKG 06/2024 Qtc 438  -AIMS exam normal 08/17/2024 -Continue Lamictal  50 mg daily -Continue BuSpar  10 mg TID -Continue Atarax  25 mg TID PRN -Provided therapy referral sheet  # Tobacco use disorder - encourage cessation   Patient was given contact information for behavioral health clinic and was instructed to call 911 for emergencies.   Identifying Information: Jackie Odonnell is a 63 y.o. female with a history of schizophrenia who presents in person to [facility] for establishment of care.    Subjective:  Patient seen ***.  Patient reports feeling *** today. Since the previous visit, ***. Stressors include ***.   Regarding psychiatric symptoms, ***. Patient reports the medications are ***. Patient reports the following adverse effects: ***.   Patient reports *** sleep, ***. Patient reports *** appetite, ***.   Patient denies current SI, HI, and AVH. ***  Substance use: Tobacco***   Past Psychiatric History:  Diagnoses: schizophrenia (dx in 1985) Previous medications: haldol ,cogentin  Previous psychiatrist: yes, previously saw Olam Ricker last 09/2023 Previous therapist: denies  Hospitalizations: yes, once in 2009 due to mother stated pt wanted to harm herself Suicide attempts: denies SIB: denies Current access to guns: denies  Hx of violence towards others: denies  Hx of trauma/abuse: denies  Substance use:  Tobacco: yes, 1 pack every 2-3 days started in college Alcohol: socially, 2 beers  on holidays Marijuana: denies current, most recently in 1980s occasionally  Other illicit substances: denies  Family Psychiatric History: cousin-bipolar schizophrenic  Social History:  Living: live alone in [place] in apartment for 15 years Occupation: disability for 12 years Relationship: single Children: none Support: sister Armed Forces Operational Officer History: none  Past Medical History:  Past Medical History:  Diagnosis Date   Anxiety    Bipolar affective (HCC)    Chemical Imbalance   Depression    Hypertension     Past Surgical History:  Procedure Laterality Date   ABDOMINAL HYSTERECTOMY     ECTOPIC PREGNANCY SURGERY     intestines removed     scar tissue issue    Family History:  Family History  Problem Relation Age of Onset   Colon cancer Neg Hx    Esophageal cancer Neg Hx    Rectal cancer Neg Hx    Stomach cancer Neg Hx     Social History   Socioeconomic History   Marital status: Single    Spouse name: Not on file   Number of children: 0   Years of education: 15   Highest education level: Some college, no degree  Occupational History   Occupation: Disability    Comment: Schizophrenia  Tobacco Use   Smoking status: Every Day    Current packs/day: 2.00    Average packs/day: 2.0 packs/day for 42.9 years (85.7 ttl pk-yrs)    Types: Cigars, Cigarettes    Start date: 12/22/1981   Smokeless tobacco: Never   Tobacco comments:    07/06/23: Patient stated she has quit smoking cigarettes but now smokes 5 Black & Mild Cigars per day.        08/03/23, pt  stated she does not smoke cigarettes but increase of Cigars   Vaping Use   Vaping status: Never Used  Substance and Sexual Activity   Alcohol use: No    Alcohol/week: 0.0 standard drinks of alcohol   Drug use: No   Sexual activity: Not Currently  Other Topics Concern   Not on file  Social History Narrative   Born and raised in [place], [place]. Currently resides in an apartment by herself. No pets. Fun: Watching tv and walking.  Denies religious/spiritual beliefs effecting health care.    Social Drivers of Corporate Investment Banker Strain: Low Risk  (07/06/2024)   Overall Financial Resource Strain (CARDIA)    Difficulty of Paying Living Expenses: Not very hard  Food Insecurity: No Food Insecurity (07/06/2024)   Hunger Vital Sign    Worried About Running Out of Food in the Last Year: Never true    Ran Out of Food in the Last Year: Never true  Recent Concern: Food Insecurity - Food Insecurity Present (07/04/2024)   Hunger Vital Sign    Worried About Running Out of Food in the Last Year: Sometimes true    Ran Out of Food in the Last Year: Sometimes true  Transportation Needs: No Transportation Needs (07/06/2024)   PRAPARE - Administrator, Civil Service (Medical): No    Lack of Transportation (Non-Medical): No  Recent Concern: Transportation Needs - Unmet Transportation Needs (07/04/2024)   PRAPARE - Transportation    Lack of Transportation (Medical): Yes    Lack of Transportation (Non-Medical): Yes  Physical Activity: Insufficiently Active (07/06/2024)   Exercise Vital Sign    Days of Exercise per Week: 3 days    Minutes of Exercise per Session: 30 min  Stress: No Stress Concern Present (07/06/2024)   [employer] of Occupational Health - Occupational Stress Questionnaire    Feeling of Stress: Only a little  Social Connections: Moderately Integrated (07/06/2024)   Social Connection and Isolation Panel    Frequency of Communication with Friends and Family: More than three times a week    Frequency of Social Gatherings with Friends and Family: More than three times a week    Attends Religious Services: 1 to 4 times per year    Active Member of [employer] or Organizations: Yes    Attends Banker Meetings: Never    Marital Status: Never married    Allergies: No Known Allergies  Current Medications: Current Outpatient Medications  Medication Sig Dispense Refill   acetaminophen   (TYLENOL ) 325 MG tablet Take 1 tablet (325 mg total) by mouth every 6 (six) hours as needed. 270 tablet 11   amLODipine  (NORVASC ) 10 MG tablet Take 1 tablet by mouth once daily 90 tablet 0   ARIPiprazole  (ABILIFY ) 30 MG tablet Take 1 tablet (30 mg total) by mouth daily. 60 tablet 0   busPIRone  (BUSPAR ) 10 MG tablet Take 1 tablet (10 mg total) by mouth 3 (three) times daily. 90 tablet 1   famotidine  (PEPCID ) 20 MG tablet Take 1 tablet (20 mg total) by mouth 2 (two) times daily. 60 tablet 1   hydrOXYzine  (ATARAX ) 25 MG tablet Take 1 tablet (25 mg total) by mouth every 8 (eight) hours as needed for anxiety. 60 tablet 0   lamoTRIgine  (LAMICTAL ) 25 MG tablet Take 2 tablets (50 mg total) by mouth daily. 120 tablet 0   lisinopril  (ZESTRIL ) 40 MG tablet Take 1 tablet by mouth once daily 90 tablet 0   metoprolol  tartrate (LOPRESSOR )  100 MG tablet Take 1 tablet (100 mg total) by mouth 2 (two) times daily. 180 tablet 3   Multiple Vitamins-Iron (MULTIVITAMINS WITH IRON) TABS tablet Take 1 tablet by mouth daily.     Potassium Chloride  ER 20 MEQ TBCR Take 1 tablet (20 mEq total) by mouth daily. 90 tablet 3   potassium chloride  SA (KLOR-CON  M) 20 MEQ tablet Take 1 tablet (20 mEq total) by mouth daily. 60 tablet 1   predniSONE  (DELTASONE ) 20 MG tablet Take 2 tablets (40 mg total) by mouth daily with breakfast. 10 tablet 0   No current facility-administered medications for this visit.    Objective: Psychiatric Specialty Exam: General Appearance: appears at stated age, casually dressed and groomed ***  Behavior: pleasant and cooperative ***  Psychomotor Activity: no psychomotor agitation or retardation noted ***  Eye Contact: fair *** Speech: normal amount, volume and fluency ***   Mood: euthymic *** Affect: congruent, pleasant and interactive ***  Thought Process: linear, goal directed, no circumstantial or tangential thought process noted, no racing thoughts or flight of ideas *** Descriptions of  Associations: intact ***  Thought Content Hallucinations: denies AH, VH , does not appear responding to stimuli *** Delusions: no paranoia, delusions of control, grandeur, ideas of reference, thought broadcasting, and magical thinking *** Suicidal Thoughts: denies SI, intention, plan *** Homicidal Thoughts: denies HI, intention, plan ***  Alertness/Orientation: alert and fully oriented ***  Insight: fair*** Judgment: fair***  Memory: intact ***  Executive Functions  Concentration: intact *** Attention Span: fair *** Recall: intact *** Fund of Knowledge: fair ***  Physical Exam *** General: Pleasant, well-appearing. No acute distress. Pulmonary: Normal effort. No wheezing or rales. Skin: No obvious rash or lesions.*** Neuro: A&Ox3.No focal deficit.  Review of Systems *** No reported symptoms    Metabolic Disorder Labs: Lab Results  Component Value Date   HGBA1C 6.4 08/08/2024   MPG 123 (H) 08/06/2012   No results found for: PROLACTIN Lab Results  Component Value Date   CHOL 128 08/08/2024   TRIG 63.0 08/08/2024   HDL 71.00 08/08/2024   CHOLHDL 2 08/08/2024   VLDL 12.6 08/08/2024   LDLCALC 45 08/08/2024   LDLCALC 56 08/03/2023   Lab Results  Component Value Date   TSH 2.25 01/26/2024    Therapeutic Level Labs: No results found for: LITHIUM No results found for: CBMZ No results found for: VALPROATE  Screenings:  PHQ2-9    Flowsheet Row Office Visit from 08/08/2024 in [facility] [place] Clinical Support from 07/06/2024 in [facility] [place] Office Visit from 01/26/2024 in [facility] Clinical Support from 07/06/2023 in [facility] Visit from 12/26/2022 in [facility]  PHQ-2 Total Score 1 2 0 0 0  PHQ-9 Total Score 7 4 -- 1 0    Collaboration of Care: Case discussed with attending, see  attending's attestation for additional information.  Ismael Franco, MD PGY-3 Psychiatry Resident

## 2024-11-14 ENCOUNTER — Ambulatory Visit (HOSPITAL_COMMUNITY): Admitting: Psychiatry

## 2024-11-21 ENCOUNTER — Other Ambulatory Visit: Payer: Self-pay | Admitting: Internal Medicine

## 2024-11-25 ENCOUNTER — Ambulatory Visit: Admitting: Internal Medicine

## 2024-11-28 ENCOUNTER — Ambulatory Visit: Admitting: Internal Medicine

## 2024-11-28 NOTE — Progress Notes (Deleted)
 Psychiatric Adult Assessment Progress Note  Patient Identification: Jackie Odonnell MRN:  [id number] Date of Evaluation:  11/28/2024  Assessment: Patient presents for a follow up evaluation. In the prior visit, her psychotropic medications were maintained.   Today, ***  Plan:  # Schizoaffective disorder, depressive type - Continue Abilify  30 mg daily - Labs reviewed from 07/2024: CBC and CMP unremarkable, A1c, and Lipid panel WNL, TSH from 01/2024 WNL, EKG 06/2024 Qtc 438  -AIMS exam normal 08/17/2024 -Continue Lamictal  50 mg daily -Continue BuSpar  10 mg TID -Continue Atarax  25 mg TID PRN -Provided therapy referral sheet  # Tobacco use disorder - encourage cessation   Patient was given contact information for behavioral health clinic and was instructed to call 911 for emergencies.   Identifying Information: Jackie Odonnell is a 63 y.o. female with a history of schizophrenia who presents in person to [facility] for establishment of care.    Subjective:  Patient seen ***.  Patient reports feeling *** today. Since the previous visit, ***. Stressors include ***.   Regarding psychiatric symptoms, ***. Patient reports the medications are ***. Patient reports the following adverse effects: ***.   Patient reports *** sleep, ***. Patient reports *** appetite, ***.   Patient denies current SI, HI, and AVH. ***  Substance use: Tobacco***   Past Psychiatric History:  Diagnoses: schizophrenia (dx in 1985) Previous medications: haldol ,cogentin  Previous psychiatrist: yes, previously saw Olam Ricker last 09/2023 Previous therapist: denies  Hospitalizations: yes, once in 2009 due to mother stated pt wanted to harm herself Suicide attempts: denies SIB: denies Current access to guns: denies  Hx of violence towards others: denies  Hx of trauma/abuse: denies  Substance use:  Tobacco: yes, 1 pack every 2-3 days started in college Alcohol: socially, 2 beers on  holidays Marijuana: denies current, most recently in 1980s occasionally  Other illicit substances: denies  Family Psychiatric History: cousin-bipolar schizophrenic  Social History:  Living: live alone in [place] in apartment for 15 years Occupation: disability for 12 years Relationship: single Children: none Support: sister Armed Forces Operational Officer History: none  Past Medical History:  Past Medical History:  Diagnosis Date   Anxiety    Bipolar affective (HCC)    Chemical Imbalance   Depression    Hypertension     Past Surgical History:  Procedure Laterality Date   ABDOMINAL HYSTERECTOMY     ECTOPIC PREGNANCY SURGERY     intestines removed     scar tissue issue    Family History:  Family History  Problem Relation Age of Onset   Colon cancer Neg Hx    Esophageal cancer Neg Hx    Rectal cancer Neg Hx    Stomach cancer Neg Hx     Social History   Socioeconomic History   Marital status: Single    Spouse name: Not on file   Number of children: 0   Years of education: 15   Highest education level: Some college, no degree  Occupational History   Occupation: Disability    Comment: Schizophrenia  Tobacco Use   Smoking status: Every Day    Current packs/day: 2.00    Average packs/day: 2.0 packs/day for 42.9 years (85.9 ttl pk-yrs)    Types: Cigars, Cigarettes    Start date: 12/22/1981   Smokeless tobacco: Never   Tobacco comments:    07/06/23: Patient stated she has quit smoking cigarettes but now smokes 5 Black & Mild Cigars per day.        08/03/23, pt  stated she does not smoke cigarettes but increase of Cigars   Vaping Use   Vaping status: Never Used  Substance and Sexual Activity   Alcohol use: No    Alcohol/week: 0.0 standard drinks of alcohol   Drug use: No   Sexual activity: Not Currently  Other Topics Concern   Not on file  Social History Narrative   Born and raised in [place], [place]. Currently resides in an apartment by herself. No pets. Fun: Watching tv and walking. Denies  religious/spiritual beliefs effecting health care.    Social Drivers of Corporate Investment Banker Strain: Low Risk  (07/06/2024)   Overall Financial Resource Strain (CARDIA)    Difficulty of Paying Living Expenses: Not very hard  Food Insecurity: No Food Insecurity (07/06/2024)   Hunger Vital Sign    Worried About Running Out of Food in the Last Year: Never true    Ran Out of Food in the Last Year: Never true  Recent Concern: Food Insecurity - Food Insecurity Present (07/04/2024)   Hunger Vital Sign    Worried About Running Out of Food in the Last Year: Sometimes true    Ran Out of Food in the Last Year: Sometimes true  Transportation Needs: No Transportation Needs (07/06/2024)   PRAPARE - Administrator, Civil Service (Medical): No    Lack of Transportation (Non-Medical): No  Recent Concern: Transportation Needs - Unmet Transportation Needs (07/04/2024)   PRAPARE - Transportation    Lack of Transportation (Medical): Yes    Lack of Transportation (Non-Medical): Yes  Physical Activity: Insufficiently Active (07/06/2024)   Exercise Vital Sign    Days of Exercise per Week: 3 days    Minutes of Exercise per Session: 30 min  Stress: No Stress Concern Present (07/06/2024)   [employer] of Occupational Health - Occupational Stress Questionnaire    Feeling of Stress: Only a little  Social Connections: Moderately Integrated (07/06/2024)   Social Connection and Isolation Panel    Frequency of Communication with Friends and Family: More than three times a week    Frequency of Social Gatherings with Friends and Family: More than three times a week    Attends Religious Services: 1 to 4 times per year    Active Member of [employer] or Organizations: Yes    Attends Banker Meetings: Never    Marital Status: Never married    Allergies: No Known Allergies  Current Medications: Current Outpatient Medications  Medication Sig Dispense Refill   acetaminophen  (TYLENOL ) 325  MG tablet Take 1 tablet (325 mg total) by mouth every 6 (six) hours as needed. 270 tablet 11   amLODipine  (NORVASC ) 10 MG tablet Take 1 tablet by mouth once daily 90 tablet 0   ARIPiprazole  (ABILIFY ) 30 MG tablet Take 1 tablet (30 mg total) by mouth daily. 60 tablet 0   famotidine  (PEPCID ) 20 MG tablet Take 1 tablet (20 mg total) by mouth 2 (two) times daily. 60 tablet 1   lamoTRIgine  (LAMICTAL ) 25 MG tablet Take 2 tablets (50 mg total) by mouth daily. 120 tablet 0   lisinopril  (ZESTRIL ) 40 MG tablet Take 1 tablet by mouth once daily 90 tablet 0   metoprolol  tartrate (LOPRESSOR ) 100 MG tablet Take 1 tablet (100 mg total) by mouth 2 (two) times daily. 180 tablet 3   Multiple Vitamins-Iron (MULTIVITAMINS WITH IRON) TABS tablet Take 1 tablet by mouth daily.     Potassium Chloride  ER 20 MEQ TBCR Take 1 tablet (20 mEq  total) by mouth daily. 90 tablet 3   potassium chloride  SA (KLOR-CON  M) 20 MEQ tablet Take 1 tablet (20 mEq total) by mouth daily. 60 tablet 1   predniSONE  (DELTASONE ) 20 MG tablet Take 2 tablets (40 mg total) by mouth daily with breakfast. 10 tablet 0   No current facility-administered medications for this visit.    Objective: Psychiatric Specialty Exam: General Appearance: appears at stated age, casually dressed and groomed ***  Behavior: pleasant and cooperative ***  Psychomotor Activity: no psychomotor agitation or retardation noted ***  Eye Contact: fair *** Speech: normal amount, volume and fluency ***   Mood: euthymic *** Affect: congruent, pleasant and interactive ***  Thought Process: linear, goal directed, no circumstantial or tangential thought process noted, no racing thoughts or flight of ideas *** Descriptions of Associations: intact ***  Thought Content Hallucinations: denies AH, VH , does not appear responding to stimuli *** Delusions: no paranoia, delusions of control, grandeur, ideas of reference, thought broadcasting, and magical thinking *** Suicidal  Thoughts: denies SI, intention, plan *** Homicidal Thoughts: denies HI, intention, plan ***  Alertness/Orientation: alert and fully oriented ***  Insight: fair*** Judgment: fair***  Memory: intact ***  Executive Functions  Concentration: intact *** Attention Span: fair *** Recall: intact *** Fund of Knowledge: fair ***  Physical Exam *** General: Pleasant, well-appearing. No acute distress. Pulmonary: Normal effort. No wheezing or rales. Skin: No obvious rash or lesions.*** Neuro: A&Ox3.No focal deficit.  Review of Systems *** No reported symptoms    Metabolic Disorder Labs: Lab Results  Component Value Date   HGBA1C 6.4 08/08/2024   MPG 123 (H) 08/06/2012   No results found for: PROLACTIN Lab Results  Component Value Date   CHOL 128 08/08/2024   TRIG 63.0 08/08/2024   HDL 71.00 08/08/2024   CHOLHDL 2 08/08/2024   VLDL 12.6 08/08/2024   LDLCALC 45 08/08/2024   LDLCALC 56 08/03/2023   Lab Results  Component Value Date   TSH 2.25 01/26/2024    Therapeutic Level Labs: No results found for: LITHIUM No results found for: CBMZ No results found for: VALPROATE  Screenings:  PHQ2-9    Flowsheet Row Office Visit from 08/08/2024 in [facility] [place] Clinical Support from 07/06/2024 in [facility] [place] Office Visit from 01/26/2024 in [facility] [place] [facility] Clinical Support from 07/06/2023 in [facility] Visit from 12/26/2022 in [facility]  PHQ-2 Total Score 1 2 0 0 0  PHQ-9 Total Score 7 4 -- 1 0    Collaboration of Care: Case discussed with attending, see attending's attestation for additional information.  Ismael Franco, MD PGY-3 Psychiatry Resident

## 2024-12-07 ENCOUNTER — Ambulatory Visit (HOSPITAL_COMMUNITY): Admitting: Psychiatry

## 2024-12-07 ENCOUNTER — Telehealth: Payer: Self-pay

## 2024-12-07 NOTE — Telephone Encounter (Signed)
 Copied from CRM #[id number]. Topic: Clinical - Medical Advice >> Dec 07, 2024 11:37 AM Rea ORN wrote: Reason for CRM: pt is asking if she can take a probiotic and imodium  together. Please call back [phone] to advise.

## 2024-12-08 NOTE — Telephone Encounter (Signed)
 Spoke with Lauraine, NP and confirmed there is no contraindication with taking the Probiotic and Imodium  together. Pt is aware.

## 2024-12-16 ENCOUNTER — Other Ambulatory Visit: Payer: Self-pay | Admitting: Internal Medicine

## 2024-12-16 MED ORDER — AMLODIPINE BESYLATE 10 MG PO TABS: 10.0000 mg | ORAL_TABLET | Freq: Every day | ORAL | 0 refills | Status: AC

## 2024-12-16 NOTE — Telephone Encounter (Signed)
 Copied from CRM [id number]. Topic: Clinical - Medication Refill >> Dec 16, 2024  1:51 PM Ahlexyia S wrote: Medication: amLODipine  (NORVASC ) 10 MG tablet  Has the patient contacted their pharmacy? Yes, pt stated that the pharmacy Is on lunch. (Agent: If no, request that the patient contact the pharmacy for the refill. If patient does not wish to contact the pharmacy document the reason why and proceed with request.) (Agent: If yes, when and what did the pharmacy advise?)  This is the patient's preferred pharmacy:  [facility] [address], [place] - [address] [place] [address] Phone: [phone] Fax: [phone]  Is this the correct pharmacy for this prescription? Yes If no, delete pharmacy and type the correct one.   Has the prescription been filled recently? No  Is the patient out of the medication? Yes, pt will be out of medication.  Has the patient been seen for an appointment in the last year OR does the patient have an upcoming appointment? Yes  Can we respond through MyChart? Yes  Agent: Please be advised that Rx refills may take up to 3 business days. We ask that you follow-up with your pharmacy.

## 2024-12-22 ENCOUNTER — Other Ambulatory Visit: Payer: Self-pay | Admitting: Internal Medicine

## 2024-12-22 DIAGNOSIS — I10 Essential (primary) hypertension: Secondary | ICD-10-CM

## 2024-12-26 ENCOUNTER — Telehealth: Payer: Self-pay

## 2024-12-26 NOTE — Telephone Encounter (Signed)
 Copied from CRM [id number]. Topic: Clinical - Prescription Issue >> Dec 23, 2024  3:59 PM Thersia BROCKS wrote: Reason for CRM: Patient called in regarding her prescription metoprolol  tartrate (LOPRESSOR ) 100 MG tablet did inform her that we received it yesterday patient stated she only has one pill left

## 2024-12-26 NOTE — Progress Notes (Signed)
 " Psychiatric Adult Assessment Progress Note  Patient Identification: Jackie Odonnell MRN:  [id number] Date of Evaluation:  01/02/2025  Assessment: Patient presents for a follow up evaluation. In the prior visit, her psychotropic medications were maintained.   Today, patient still presents with ongoing paranoia about the maintenance people messing with her house locks and in turn she has been smoking more cigars. She is already at maximum dose for Abilify  to aid with schizophrenia and I feel that her symptoms are not at target improvement so we discussed starting the cross titration from Abilify  to Invega . We will maintain her other psychotropic medications. F/u in 4 weeks.   Future considerations if she becomes more stable include tapering off Lamictal  as she is already only taking 25 mg and unsure of its effect for her. If Invega  shows to help her paranoia, we can discuss transitioning her to an LAI.   Plan:  # Schizoaffective disorder, depressive type - Decrease Abilify  to 20 mg daily for two weeks -> decrease to 15 mg daily - Labs reviewed from 07/2024: CBC and CMP unremarkable, A1c, and Lipid panel WNL, TSH from 01/2024 WNL, EKG 06/2024 Qtc 438  -AIMS exam normal 08/17/2024 - Start Invega  3 mg daily for two weeks -> increase to 6 mg daily  -Continue Lamictal  25 mg daily (was prescribed 50 mg daily) -Continue BuSpar  10 mg TID -Continue Atarax  25 mg TID PRN -Provided therapy referral sheet  # Tobacco use disorder - Encourage cessation   Patient was given contact information for behavioral health clinic and was instructed to call 911 for emergencies.   Identifying Information: Jackie Odonnell is a 64 y.o. female with a history of schizophrenia who presents in person to [facility] for establishment of care.    Subjective:  Patient seen alone.  She reports feeling about the same as before. She states that people are still messing with her locks. She is still  living in the current situation. She states buying a bottom lock and she would call the front desk to complain. She states that other residents are trying to mess with the locks. She states that maintenance possibly broke her TV. She called the maintenance to fix her kitchen sink. She has called to change her locks but when she came home, she saw that the maintenance people came form the back.   She thinks that her medications are helping with her sleep and mood. She states her medications also keep her some being very irritated. She states sometimes hearing her family's voices in her head when they are not there but she feels this has gotten better and states she sat down and meditated about it. She states that she is only taking one tablet Lamictal  daily. She states her Abilify  helps her not cuss out the maintenance people. She notes taking hydroxyzine  about once a week, no more than three times a week.   Patient denies current SI and HI.  Substance use: Tobacco- smoking 5 cigars (black Miles) daily. She states this increased as she is feeling more stressed.   Past Psychiatric History:  Diagnoses: schizophrenia (dx in 1985) Previous medications: haldol ,cogentin  Previous psychiatrist: yes, previously saw Olam Ricker last 09/2023 Previous therapist: denies  Hospitalizations: yes, once in 2009 due to mother stated pt wanted to harm herself Suicide attempts: denies SIB: denies Current access to guns: denies  Hx of violence towards others: denies  Hx of trauma/abuse: denies  Substance use:  Tobacco: yes, 1 pack every 2-3  days started in college Alcohol: socially, 2 beers on holidays Marijuana: denies current, most recently in 1980s occasionally  Other illicit substances: denies  Family Psychiatric History: cousin-bipolar schizophrenic  Social History:  Living: live alone in [place] in apartment for 15 years Occupation: disability for 12 years Relationship: single Children: none Support:  sister Armed Forces Operational Officer History: none  Past Medical History:  Past Medical History:  Diagnosis Date   Anxiety    Bipolar affective (HCC)    Chemical Imbalance   Depression    Hypertension     Past Surgical History:  Procedure Laterality Date   ABDOMINAL HYSTERECTOMY     ECTOPIC PREGNANCY SURGERY     intestines removed     scar tissue issue    Family History:  Family History  Problem Relation Age of Onset   Colon cancer Neg Hx    Esophageal cancer Neg Hx    Rectal cancer Neg Hx    Stomach cancer Neg Hx     Social History   Socioeconomic History   Marital status: Single    Spouse name: Not on file   Number of children: 0   Years of education: 15   Highest education level: Some college, no degree  Occupational History   Occupation: Disability    Comment: Schizophrenia  Tobacco Use   Smoking status: Every Day    Current packs/day: 2.00    Average packs/day: 2.0 packs/day for 43.0 years (86.1 ttl pk-yrs)    Types: Cigars, Cigarettes    Start date: 12/22/1981   Smokeless tobacco: Never   Tobacco comments:    07/06/23: Patient stated she has quit smoking cigarettes but now smokes 5 Black & Mild Cigars per day.        08/03/23, pt stated she does not smoke cigarettes but increase of Cigars   Vaping Use   Vaping status: Never Used  Substance and Sexual Activity   Alcohol use: No    Alcohol/week: 0.0 standard drinks of alcohol   Drug use: No   Sexual activity: Not Currently  Other Topics Concern   Not on file  Social History Narrative   Born and raised in [place], [place]. Currently resides in an apartment by herself. No pets. Fun: Watching tv and walking. Denies religious/spiritual beliefs effecting health care.    Social Drivers of Health   Tobacco Use: High Risk (08/08/2024)   Patient History    Smoking Tobacco Use: Every Day    Smokeless Tobacco Use: Never    Passive Exposure: Not on file  Financial Resource Strain: Low Risk (07/06/2024)   Overall Financial Resource  Strain (CARDIA)    Difficulty of Paying Living Expenses: Not very hard  Food Insecurity: No Food Insecurity (07/06/2024)   Epic    Worried About Programme Researcher, Broadcasting/film/video in the Last Year: Never true    Ran Out of Food in the Last Year: Never true  Recent Concern: Food Insecurity - Food Insecurity Present (07/04/2024)   Epic    Worried About Programme Researcher, Broadcasting/film/video in the Last Year: Sometimes true    Ran Out of Food in the Last Year: Sometimes true  Transportation Needs: No Transportation Needs (07/06/2024)   Epic    Lack of Transportation (Medical): No    Lack of Transportation (Non-Medical): No  Recent Concern: Transportation Needs - Unmet Transportation Needs (07/04/2024)   Epic    Lack of Transportation (Medical): Yes    Lack of Transportation (Non-Medical): Yes  Physical Activity: Insufficiently Active (07/06/2024)  Exercise Vital Sign    Days of Exercise per Week: 3 days    Minutes of Exercise per Session: 30 min  Stress: No Stress Concern Present (07/06/2024)   [employer] of Occupational Health - Occupational Stress Questionnaire    Feeling of Stress: Only a little  Social Connections: Moderately Integrated (07/06/2024)   Social Connection and Isolation Panel    Frequency of Communication with Friends and Family: More than three times a week    Frequency of Social Gatherings with Friends and Family: More than three times a week    Attends Religious Services: 1 to 4 times per year    Active Member of [employer] or Organizations: Yes    Attends Banker Meetings: Never    Marital Status: Never married  Depression (PHQ2-9): Medium Risk (08/08/2024)   Depression (PHQ2-9)    PHQ-2 Score: 7  Alcohol Screen: Low Risk (07/06/2024)   Alcohol Screen    Last Alcohol Screening Score (AUDIT): 1  Housing: Unknown (07/06/2024)   Epic    Unable to Pay for Housing in the Last Year: No    Number of Times Moved in the Last Year: Not on file    Homeless in the Last Year: No  Utilities: Not  At Risk (07/06/2024)   Epic    Threatened with loss of utilities: No  Health Literacy: Adequate Health Literacy (07/06/2024)   B1300 Health Literacy    Frequency of need for help with medical instructions: Never    Allergies: No Known Allergies  Current Medications: Current Outpatient Medications  Medication Sig Dispense Refill   acetaminophen  (TYLENOL ) 325 MG tablet Take 1 tablet (325 mg total) by mouth every 6 (six) hours as needed. 270 tablet 11   amLODipine  (NORVASC ) 10 MG tablet Take 1 tablet (10 mg total) by mouth daily. 90 tablet 0   ARIPiprazole  (ABILIFY ) 30 MG tablet Take 1 tablet (30 mg total) by mouth daily. 60 tablet 0   famotidine  (PEPCID ) 20 MG tablet Take 1 tablet (20 mg total) by mouth 2 (two) times daily. 60 tablet 1   lamoTRIgine  (LAMICTAL ) 25 MG tablet Take 2 tablets (50 mg total) by mouth daily. 120 tablet 0   lisinopril  (ZESTRIL ) 40 MG tablet Take 1 tablet by mouth once daily 90 tablet 0   metoprolol  tartrate (LOPRESSOR ) 100 MG tablet Take 1 tablet by mouth twice daily 180 tablet 0   Multiple Vitamins-Iron (MULTIVITAMINS WITH IRON) TABS tablet Take 1 tablet by mouth daily.     Potassium Chloride  ER 20 MEQ TBCR Take 1 tablet (20 mEq total) by mouth daily. 90 tablet 3   potassium chloride  SA (KLOR-CON  M) 20 MEQ tablet Take 1 tablet (20 mEq total) by mouth daily. 60 tablet 1   predniSONE  (DELTASONE ) 20 MG tablet Take 2 tablets (40 mg total) by mouth daily with breakfast. 10 tablet 0   No current facility-administered medications for this visit.    Objective: Psychiatric Specialty Exam: General Appearance: appears at stated age, casually dressed and groomed   Behavior: pleasant and cooperative   Psychomotor Activity: no psychomotor agitation or retardation noted   Eye Contact: fair  Speech: normal amount, volume and fluency    Mood: anxious  Affect: congruent  Thought Process: perseverating on locks Descriptions of Associations: intact   Thought  Content Hallucinations: reports AH of her family members Delusions: paranoia present, possible delusion about her neighbors and maintenance people messing with locks Suicidal Thoughts: denies SI, intention, plan  Homicidal Thoughts: denies HI,  intention, plan   Alertness/Orientation: alert and fully oriented   Insight: limited Judgment: limited  Memory: intact   Executive Functions  Concentration: intact  Attention Span: fair  Recall: intact  Fund of Knowledge: fair   Physical Exam  General: Pleasant, well-appearing. No acute distress. Pulmonary: Normal effort. No wheezing or rales. Skin: No obvious rash or lesions. Neuro: A&Ox3.No focal deficit.  Review of Systems  No reported symptoms  Metabolic Disorder Labs: Lab Results  Component Value Date   HGBA1C 6.4 08/08/2024   MPG 123 (H) 08/06/2012   No results found for: PROLACTIN Lab Results  Component Value Date   CHOL 128 08/08/2024   TRIG 63.0 08/08/2024   HDL 71.00 08/08/2024   CHOLHDL 2 08/08/2024   VLDL 12.6 08/08/2024   LDLCALC 45 08/08/2024   LDLCALC 56 08/03/2023   Lab Results  Component Value Date   TSH 2.25 01/26/2024    Therapeutic Level Labs: No results found for: LITHIUM No results found for: CBMZ No results found for: VALPROATE  Screenings:  PHQ2-9    Flowsheet Row Office Visit from 08/08/2024 in [facility] [place] Clinical Support from 07/06/2024 in [facility] [place] Office Visit from 01/26/2024 in [facility] Clinical Support from 07/06/2023 in [facility] Visit from 12/26/2022 in [facility]  PHQ-2 Total Score 1 2 0 0 0  PHQ-9 Total Score 7 4 -- 1 0    Collaboration of Care: Case discussed with attending, see attending's attestation for additional information.  Ismael Franco, MD PGY-3 Psychiatry Resident  "

## 2024-12-29 NOTE — Telephone Encounter (Signed)
 Rx sent

## 2025-01-02 ENCOUNTER — Ambulatory Visit (HOSPITAL_COMMUNITY): Admitting: Psychiatry

## 2025-01-02 VITALS — BP 128/71 | Wt 208.0 lb

## 2025-01-02 DIAGNOSIS — F200 Paranoid schizophrenia: Secondary | ICD-10-CM

## 2025-01-02 MED ORDER — HYDROXYZINE HCL 25 MG PO TABS: 25.0000 mg | ORAL_TABLET | Freq: Three times a day (TID) | ORAL | 0 refills | Status: AC | PRN

## 2025-01-02 MED ORDER — LAMOTRIGINE 25 MG PO TABS: 25.0000 mg | ORAL_TABLET | Freq: Every day | ORAL | 0 refills | Status: AC

## 2025-01-02 MED ORDER — PALIPERIDONE ER 3 MG PO TB24: ORAL_TABLET | ORAL | 0 refills | Status: AC

## 2025-01-02 MED ORDER — ARIPIPRAZOLE 15 MG PO TABS: 15.0000 mg | ORAL_TABLET | Freq: Every day | ORAL | 0 refills | Status: AC

## 2025-01-02 MED ORDER — ARIPIPRAZOLE 20 MG PO TABS: 20.0000 mg | ORAL_TABLET | Freq: Every day | ORAL | 0 refills | Status: AC

## 2025-01-02 MED ORDER — BUSPIRONE HCL 10 MG PO TABS: 10.0000 mg | ORAL_TABLET | Freq: Three times a day (TID) | ORAL | 0 refills | Status: AC

## 2025-01-02 MED ORDER — LAMOTRIGINE 25 MG PO TABS: 50.0000 mg | ORAL_TABLET | Freq: Every day | ORAL | 0 refills | Status: DC

## 2025-01-02 NOTE — Patient Instructions (Addendum)
-   start Invega  3 mg for two weeks -> increase invega  to 6 mg  - decrease abilify  to 20 mg for 2 weeks -> decrease abilify  to 15 mg

## 2025-01-03 NOTE — Addendum Note (Signed)
 Addended by: CARVIN CROCK on: 01/03/2025 07:51 AM   Modules accepted: Level of Service

## 2025-01-17 NOTE — Progress Notes (Unsigned)
 " Psychiatric Adult Assessment Progress Note  Patient Identification: Jackie Odonnell MRN:  [id number] Date of Evaluation:  01/17/2025  Assessment: Patient presents for a follow up evaluation. In the prior visit, we started the cross titration from Abilify  to Invega  due to ongoing symptoms of paranoia. Today, ***.  Plan:  # Schizoaffective disorder, depressive type - Decrease Abilify  to 20 mg daily for two weeks -> decrease to 15 mg daily - Labs reviewed from 07/2024: CBC and CMP unremarkable, A1c, and Lipid panel WNL, TSH from 01/2024 WNL, EKG 06/2024 Qtc 438  -AIMS exam normal 08/17/2024 - Start Invega  3 mg daily for two weeks -> increase to 6 mg daily  - Continue Lamictal  25 mg daily (was prescribed 50 mg daily)  # GAD - Continue BuSpar  10 mg TID - Continue Atarax  25 mg TID PRN  # Tobacco use disorder - Encourage cessation  Patient was given contact information for behavioral health clinic and was instructed to call 911 for emergencies.   Identifying Information: Jackie Odonnell is a 64 y.o. female with a history of schizophrenia who presents in person to [facility] for establishment of care.    Subjective:  Patient seen ***.  Patient reports feeling *** today. Regarding psychiatric symptoms, ***. Patient reports the medications are ***. Patient reports the following adverse effects: ***.  Patient reports *** sleep, ***. Patient reports *** appetite, ***.   Since the previous visit, ***. Stressors include ***.   Patient denies current SI, HI, and AVH. ***  Substance use: *** Tobacco- smoking 5 cigars (black Miles) daily.   Past Psychiatric History:  Diagnoses: schizophrenia (dx in 1985) Previous medications: haldol ,cogentin  Previous psychiatrist: yes, previously saw Olam Ricker last 09/2023 Previous therapist: denies  Hospitalizations: yes, once in 2009 due to mother stated pt wanted to harm herself Suicide attempts: denies SIB: denies Current  access to guns: denies  Hx of violence towards others: denies  Hx of trauma/abuse: denies  Substance use:  Tobacco: yes, 1 pack every 2-3 days started in college Alcohol: socially, 2 beers on holidays Marijuana: denies current, most recently in 1980s occasionally  Other illicit substances: denies  Family Psychiatric History: cousin-bipolar schizophrenic  Social History:  Living: live alone in [place] in apartment for 15 years Occupation: disability for 12 years Relationship: single Children: none Support: sister [employer] Operational Officer History: none  Past Medical History:  Past Medical History:  Diagnosis Date   Anxiety    Bipolar affective (HCC)    Chemical Imbalance   Depression    Hypertension     Past Surgical History:  Procedure Laterality Date   ABDOMINAL HYSTERECTOMY     ECTOPIC PREGNANCY SURGERY     intestines removed     scar tissue issue    Family History:  Family History  Problem Relation Age of Onset   Colon cancer Neg Hx    Esophageal cancer Neg Hx    Rectal cancer Neg Hx    Stomach cancer Neg Hx     Social History   Socioeconomic History   Marital status: Single    Spouse name: Not on file   Number of children: 0   Years of education: 15   Highest education level: Some college, no degree  Occupational History   Occupation: Disability    Comment: Schizophrenia  Tobacco Use   Smoking status: Every Day    Current packs/day: 2.00    Average packs/day: 2.0 packs/day for 43.1 years (86.1 ttl pk-yrs)    Types:  Cigars, Cigarettes    Start date: 12/22/1981   Smokeless tobacco: Never   Tobacco comments:    07/06/23: Patient stated she has quit smoking cigarettes but now smokes 5 Black & Mild Cigars per day.        08/03/23, pt stated she does not smoke cigarettes but increase of Cigars   Vaping Use   Vaping status: Never Used  Substance and Sexual Activity   Alcohol use: No    Alcohol/week: 0.0 standard drinks of alcohol   Drug use: No   Sexual activity: Not  Currently  Other Topics Concern   Not on file  Social History Narrative   Born and raised in [place], [place]. Currently resides in an apartment by herself. No pets. Fun: Watching tv and walking. Denies religious/spiritual beliefs effecting health care.    Social Drivers of Health   Tobacco Use: High Risk (08/08/2024)   Patient History    Smoking Tobacco Use: Every Day    Smokeless Tobacco Use: Never    Passive Exposure: Not on file  Financial Resource Strain: Low Risk (07/06/2024)   Overall Financial Resource Strain (CARDIA)    Difficulty of Paying Living Expenses: Not very hard  Food Insecurity: No Food Insecurity (07/06/2024)   Epic    Worried About Programme Researcher, Broadcasting/film/video in the Last Year: Never true    Ran Out of Food in the Last Year: Never true  Recent Concern: Food Insecurity - Food Insecurity Present (07/04/2024)   Epic    Worried About Programme Researcher, Broadcasting/film/video in the Last Year: Sometimes true    Ran Out of Food in the Last Year: Sometimes true  Transportation Needs: No Transportation Needs (07/06/2024)   Epic    Lack of Transportation (Medical): No    Lack of Transportation (Non-Medical): No  Recent Concern: Transportation Needs - Unmet Transportation Needs (07/04/2024)   Epic    Lack of Transportation (Medical): Yes    Lack of Transportation (Non-Medical): Yes  Physical Activity: Insufficiently Active (07/06/2024)   Exercise Vital Sign    Days of Exercise per Week: 3 days    Minutes of Exercise per Session: 30 min  Stress: No Stress Concern Present (07/06/2024)   [employer] of Occupational Health - Occupational Stress Questionnaire    Feeling of Stress: Only a little  Social Connections: Moderately Integrated (07/06/2024)   Social Connection and Isolation Panel    Frequency of Communication with Friends and Family: More than three times a week    Frequency of Social Gatherings with Friends and Family: More than three times a week    Attends Religious Services: 1 to 4 times  per year    Active Member of Clubs or Organizations: Yes    Attends Banker Meetings: Never    Marital Status: Never married  Depression (PHQ2-9): Medium Risk (08/08/2024)   Depression (PHQ2-9)    PHQ-2 Score: 7  Alcohol Screen: Low Risk (07/06/2024)   Alcohol Screen    Last Alcohol Screening Score (AUDIT): 1  Housing: Unknown (07/06/2024)   Epic    Unable to Pay for Housing in the Last Year: No    Number of Times Moved in the Last Year: Not on file    Homeless in the Last Year: No  Utilities: Not At Risk (07/06/2024)   Epic    Threatened with loss of utilities: No  Health Literacy: Adequate Health Literacy (07/06/2024)   B1300 Health Literacy    Frequency of need for help with medical  instructions: Never    Allergies: No Known Allergies  Current Medications: Current Outpatient Medications  Medication Sig Dispense Refill   acetaminophen  (TYLENOL ) 325 MG tablet Take 1 tablet (325 mg total) by mouth every 6 (six) hours as needed. 270 tablet 11   amLODipine  (NORVASC ) 10 MG tablet Take 1 tablet (10 mg total) by mouth daily. 90 tablet 0   ARIPiprazole  (ABILIFY ) 15 MG tablet Take 1 tablet (15 mg total) by mouth daily. 30 tablet 0   ARIPiprazole  (ABILIFY ) 20 MG tablet Take 1 tablet (20 mg total) by mouth daily. 14 tablet 0   busPIRone  (BUSPAR ) 10 MG tablet Take 1 tablet (10 mg total) by mouth 3 (three) times daily. 270 tablet 0   famotidine  (PEPCID ) 20 MG tablet Take 1 tablet (20 mg total) by mouth 2 (two) times daily. 60 tablet 1   hydrOXYzine  (ATARAX ) 25 MG tablet Take 1 tablet (25 mg total) by mouth 3 (three) times daily as needed. 30 tablet 0   lamoTRIgine  (LAMICTAL ) 25 MG tablet Take 1 tablet (25 mg total) by mouth daily. 60 tablet 0   lisinopril  (ZESTRIL ) 40 MG tablet Take 1 tablet by mouth once daily 90 tablet 0   metoprolol  tartrate (LOPRESSOR ) 100 MG tablet Take 1 tablet by mouth twice daily 180 tablet 0   Multiple Vitamins-Iron (MULTIVITAMINS WITH IRON) TABS tablet  Take 1 tablet by mouth daily.     paliperidone  (INVEGA ) 3 MG 24 hr tablet Take 1 tablet (3 mg total) by mouth daily for 14 days, THEN 2 tablets (6 mg total) daily for 20 days. 54 tablet 0   Potassium Chloride  ER 20 MEQ TBCR Take 1 tablet (20 mEq total) by mouth daily. 90 tablet 3   potassium chloride  SA (KLOR-CON  M) 20 MEQ tablet Take 1 tablet (20 mEq total) by mouth daily. 60 tablet 1   predniSONE  (DELTASONE ) 20 MG tablet Take 2 tablets (40 mg total) by mouth daily with breakfast. 10 tablet 0   No current facility-administered medications for this visit.    Objective: *** Psychiatric Specialty Exam: General Appearance: appears at stated age, casually dressed and groomed   Behavior: pleasant and cooperative   Psychomotor Activity: no psychomotor agitation or retardation noted   Eye Contact: fair  Speech: normal amount, volume and fluency    Mood: anxious  Affect: congruent  Thought Process: perseverating on locks Descriptions of Associations: intact   Thought Content Hallucinations: reports AH of her family members Delusions: paranoia present, possible delusion about her neighbors and maintenance people messing with locks Suicidal Thoughts: denies SI, intention, plan  Homicidal Thoughts: denies HI, intention, plan   Alertness/Orientation: alert and fully oriented   Insight: limited Judgment: limited  Memory: intact   Executive Functions  Concentration: intact  Attention Span: fair  Recall: intact  Fund of Knowledge: fair   Physical Exam  General: Pleasant, well-appearing. No acute distress. Pulmonary: Normal effort. No wheezing or rales. Skin: No obvious rash or lesions. Neuro: A&Ox3.No focal deficit.  Review of Systems  No reported symptoms  Metabolic Disorder Labs: Lab Results  Component Value Date   HGBA1C 6.4 08/08/2024   MPG 123 (H) 08/06/2012   No results found for: PROLACTIN Lab Results  Component Value Date   CHOL 128 08/08/2024   TRIG 63.0  08/08/2024   HDL 71.00 08/08/2024   CHOLHDL 2 08/08/2024   VLDL 12.6 08/08/2024   LDLCALC 45 08/08/2024   LDLCALC 56 08/03/2023   Lab Results  Component Value Date  TSH 2.25 01/26/2024    Therapeutic Level Labs: No results found for: LITHIUM No results found for: CBMZ No results found for: VALPROATE  Screenings:  PHQ2-9    Flowsheet Row Office Visit from 08/08/2024 in [facility] Clinical Support from 07/06/2024 in [facility] Visit from 01/26/2024 in [facility] at [facility] Clinical Support from 07/06/2023 in [facility] at [facility] Visit from 12/26/2022 in [facility] at [facility]  PHQ-2 Total Score 1 2 0 0 0  PHQ-9 Total Score 7 4 -- 1 0    Collaboration of Care: Case discussed with attending, see attending's attestation for additional information.  Ismael Franco, MD PGY-3 Psychiatry Resident  "

## 2025-01-25 ENCOUNTER — Ambulatory Visit (HOSPITAL_COMMUNITY)

## 2025-01-30 ENCOUNTER — Ambulatory Visit (HOSPITAL_COMMUNITY): Admitting: Psychiatry

## 2025-02-01 ENCOUNTER — Ambulatory Visit (HOSPITAL_COMMUNITY)

## 2025-02-08 ENCOUNTER — Ambulatory Visit: Admitting: Internal Medicine

## 2025-02-13 ENCOUNTER — Ambulatory Visit: Admitting: Internal Medicine

## 2025-07-13 ENCOUNTER — Ambulatory Visit
